# Patient Record
Sex: Female | Born: 1935
Health system: Southern US, Community
[De-identification: ages and names within clinical notes are randomized; demographics above are authoritative.]

## PROBLEM LIST (undated history)

## (undated) DIAGNOSIS — F329 Major depressive disorder, single episode, unspecified: Secondary | ICD-10-CM

## (undated) DIAGNOSIS — R7302 Impaired glucose tolerance (oral): Secondary | ICD-10-CM

## (undated) DIAGNOSIS — I10 Essential (primary) hypertension: Secondary | ICD-10-CM

## (undated) DIAGNOSIS — F32A Depression, unspecified: Secondary | ICD-10-CM

## (undated) DIAGNOSIS — E2839 Other primary ovarian failure: Secondary | ICD-10-CM

## (undated) DIAGNOSIS — E785 Hyperlipidemia, unspecified: Secondary | ICD-10-CM

## (undated) HISTORY — DX: Essential (primary) hypertension: I10

## (undated) HISTORY — DX: Impaired glucose tolerance (oral): R73.02

## (undated) HISTORY — DX: Other primary ovarian failure: E28.39

## (undated) HISTORY — DX: Hyperlipidemia, unspecified: E78.5

## (undated) HISTORY — DX: Depression, unspecified: F32.A

## (undated) HISTORY — PX: NO PAST SURGERIES: SHX2092

## (undated) HISTORY — DX: Major depressive disorder, single episode, unspecified: F32.9

---

## 2006-09-12 DIAGNOSIS — I10 Essential (primary) hypertension: Secondary | ICD-10-CM | POA: Insufficient documentation

## 2013-12-18 LAB — BASIC METABOLIC PANEL
BUN: 15 mg/dL (ref 4–21)
Creatinine: 0.9 mg/dL (ref 0.5–1.1)
Potassium: 4.4 mmol/L (ref 3.4–5.3)
Sodium: 140 mmol/L (ref 137–147)

## 2013-12-18 LAB — TSH: TSH: 3.47 u[IU]/mL (ref 0.41–5.90)

## 2013-12-18 LAB — HEPATIC FUNCTION PANEL
ALT: 9 U/L (ref 7–35)
AST: 13 U/L (ref 13–35)
Alkaline Phosphatase: 77 U/L (ref 25–125)
Bilirubin, Total: 0.5 mg/dL

## 2013-12-18 LAB — LIPID PANEL
Cholesterol: 173 mg/dL (ref 0–200)
HDL: 61 mg/dL (ref 35–70)
LDL CALC: 84 mg/dL
LDl/HDL Ratio: 1.4
Triglycerides: 139 mg/dL (ref 40–160)

## 2014-01-30 LAB — BASIC METABOLIC PANEL: Glucose: 113 mg/dL

## 2014-01-30 LAB — HEMOGLOBIN A1C: Hgb A1c MFr Bld: 5.2 % (ref 4.0–6.0)

## 2014-08-01 ENCOUNTER — Encounter: Payer: Self-pay | Admitting: Family Medicine

## 2014-08-01 ENCOUNTER — Ambulatory Visit (INDEPENDENT_AMBULATORY_CARE_PROVIDER_SITE_OTHER): Payer: Commercial Managed Care - HMO | Admitting: Family Medicine

## 2014-08-01 VITALS — BP 128/70 | HR 94 | Temp 97.2°F | Resp 16 | Ht 60.0 in | Wt 151.3 lb

## 2014-08-01 DIAGNOSIS — E785 Hyperlipidemia, unspecified: Secondary | ICD-10-CM

## 2014-08-01 DIAGNOSIS — F332 Major depressive disorder, recurrent severe without psychotic features: Secondary | ICD-10-CM | POA: Insufficient documentation

## 2014-08-01 DIAGNOSIS — I10 Essential (primary) hypertension: Secondary | ICD-10-CM | POA: Diagnosis not present

## 2014-08-01 NOTE — Progress Notes (Signed)
Name: Diane Rogers   MRN: [id number]    DOB: 1928/07/09   Date:08/01/2014       Progress Note  Subjective  Chief Complaint  Chief Complaint  Patient presents with  . Hypertension    Hypertension This is a chronic problem. The current episode started more than 1 year ago. The problem is unchanged. Associated symptoms include anxiety. Pertinent negatives include no blurred vision, chest pain, headaches, neck pain, orthopnea, palpitations or shortness of breath. There are no associated agents to hypertension. Risk factors for coronary artery disease include sedentary lifestyle. Past treatments include alpha 1 blockers, ACE inhibitors and calcium channel blockers. There are no compliance problems.   Hyperlipidemia This is a chronic problem. The current episode started more than 1 year ago. The problem is uncontrolled. Recent lipid tests were reviewed and are high. Pertinent negatives include no chest pain, focal weakness, myalgias or shortness of breath. She is currently on no antihyperlipidemic treatment. Compliance problems: Refuses to take statins.  Risk factors for coronary artery disease include a sedentary lifestyle and post-menopausal.      Past Medical History  Diagnosis Date  . Ovarian failure   . Hyperlipidemia   . Hypertension   . Glucose intolerance (impaired glucose tolerance)   . Depression     History  Substance Use Topics  . Smoking status: Never Smoker   . Smokeless tobacco: Not on file  . Alcohol Use: No     Current outpatient prescriptions:  .  amLODipine-benazepril (LOTREL) 10-20 MG per capsule, Take 1 capsule by mouth daily., Disp: , Rfl:  .  doxazosin (CARDURA) 4 MG tablet, Take 1 tablet by mouth 2 (two) times daily., Disp: , Rfl:   No Known Allergies  Review of Systems  Constitutional: Negative for fever, chills and weight loss.  HENT: Negative for congestion, hearing loss, sore throat and tinnitus.   Eyes: Negative for blurred vision, double  vision and redness.  Respiratory: Negative for cough, hemoptysis and shortness of breath.   Cardiovascular: Negative for chest pain, palpitations, orthopnea, claudication and leg swelling.  Gastrointestinal: Negative for heartburn, nausea, vomiting, diarrhea, constipation and blood in stool.  Genitourinary: Negative for dysuria, urgency, frequency and hematuria.  Musculoskeletal: Negative for myalgias, back pain, joint pain, falls and neck pain.  Skin: Negative for itching.  Neurological: Negative for dizziness, tingling, tremors, focal weakness, seizures, loss of consciousness, weakness and headaches.  Endo/Heme/Allergies: Does not bruise/bleed easily.  Psychiatric/Behavioral: Negative for depression and substance abuse. The patient is not nervous/anxious and does not have insomnia.      Objective  Filed Vitals:   08/01/14 1431  BP: 128/70  Pulse: 94  Temp: 97.2 F (36.2 C)  TempSrc: Oral  Resp: 16  Height: 5' (1.524 m)  Weight: 151 lb 4.8 oz (68.629 kg)  SpO2: 96%     Physical Exam  Constitutional: She is oriented to person, place, and time and well-developed, well-nourished, and in no distress.  HENT:  Head: Normocephalic.  Eyes: EOM are normal. Pupils are equal, round, and reactive to light.  Neck: Normal range of motion. No thyromegaly present.  Cardiovascular: Normal rate, regular rhythm and normal heart sounds.   No murmur heard. Pulmonary/Chest: Effort normal and breath sounds normal.  Abdominal: Soft. Bowel sounds are normal.  Musculoskeletal: Normal range of motion. She exhibits no edema.  Neurological: She is alert and oriented to person, place, and time. No cranial nerve deficit. Gait normal.  Skin: Skin is warm and dry. No rash noted.  Psychiatric: Mood, memory, affect and judgment normal.      Assessment & Plan  1. Benign essential HTN Well controlled - Comprehensive metabolic panel - TSH  2. HLD (hyperlipidemia) Lipid panel Pt refuses  statin

## 2014-08-01 NOTE — Patient Instructions (Signed)
F/U 6 MO 

## 2014-09-10 ENCOUNTER — Telehealth: Payer: Self-pay | Admitting: Family Medicine

## 2014-09-10 MED ORDER — AMLODIPINE BESY-BENAZEPRIL HCL 10-20 MG PO CAPS: 1.0000 | ORAL_CAPSULE | Freq: Every day | ORAL | Status: DC

## 2014-09-10 NOTE — Telephone Encounter (Signed)
Done

## 2014-09-10 NOTE — Telephone Encounter (Signed)
Pt called stating that her pharmacy contacted our office on last week requesting a refill on patient's prescription, Amlodipine-benazepril 10-20mg  1 tablet daily.  [facility] on [address]

## 2014-10-29 ENCOUNTER — Other Ambulatory Visit: Payer: Self-pay | Admitting: Emergency Medicine

## 2014-10-29 ENCOUNTER — Telehealth: Payer: Self-pay | Admitting: Family Medicine

## 2014-10-29 MED ORDER — DOXAZOSIN MESYLATE 4 MG PO TABS: 4.0000 mg | ORAL_TABLET | Freq: Two times a day (BID) | ORAL | Status: DC

## 2014-11-06 NOTE — Telephone Encounter (Signed)
ERRENOUS °

## 2014-11-14 ENCOUNTER — Telehealth: Payer: Self-pay | Admitting: Family Medicine

## 2014-11-14 MED ORDER — AMLODIPINE BESY-BENAZEPRIL HCL 10-20 MG PO CAPS: 1.0000 | ORAL_CAPSULE | Freq: Every day | ORAL | Status: DC

## 2014-11-14 NOTE — Telephone Encounter (Signed)
Tried calling pt to inform her and there was no answer.

## 2014-11-14 NOTE — Telephone Encounter (Signed)
Pt needs refill on Amlodbenazepriel to be sent to [facility] [address]

## 2014-11-14 NOTE — Telephone Encounter (Signed)
Refill sent to pharmacy.   

## 2015-01-23 ENCOUNTER — Telehealth: Payer: Self-pay | Admitting: Family Medicine

## 2015-01-23 DIAGNOSIS — I10 Essential (primary) hypertension: Secondary | ICD-10-CM | POA: Diagnosis not present

## 2015-01-23 NOTE — Telephone Encounter (Signed)
Need refill on amlod/benazepril 10-20mg . [facility] on [address]

## 2015-01-24 LAB — COMPREHENSIVE METABOLIC PANEL
A/G RATIO: 1.6 (ref 1.1–2.5)
ALT: 8 IU/L (ref 0–32)
AST: 14 IU/L (ref 0–40)
Albumin: 4.2 g/dL (ref 3.5–4.7)
Alkaline Phosphatase: 75 IU/L (ref 39–117)
BILIRUBIN TOTAL: 0.6 mg/dL (ref 0.0–1.2)
BUN/Creatinine Ratio: 17 (ref 11–26)
BUN: 15 mg/dL (ref 8–27)
CHLORIDE: 101 mmol/L (ref 96–106)
CO2: 26 mmol/L (ref 18–29)
Calcium: 9.2 mg/dL (ref 8.7–10.3)
Creatinine, Ser: 0.87 mg/dL (ref 0.57–1.00)
GFR calc Af Amer: 71 mL/min/{1.73_m2} (ref >59)
GFR calc non Af Amer: 62 mL/min/{1.73_m2} (ref >59)
GLUCOSE: 103 mg/dL — AB (ref 65–99)
Globulin, Total: 2.7 g/dL (ref 1.5–4.5)
POTASSIUM: 4.1 mmol/L (ref 3.5–5.2)
Sodium: 141 mmol/L (ref 134–144)
Total Protein: 6.9 g/dL (ref 6.0–8.5)

## 2015-01-24 LAB — TSH: TSH: 3.11 u[IU]/mL (ref 0.450–4.500)

## 2015-01-24 MED ORDER — AMLODIPINE BESY-BENAZEPRIL HCL 10-20 MG PO CAPS: 1.0000 | ORAL_CAPSULE | Freq: Every day | ORAL | Status: DC

## 2015-01-24 NOTE — Telephone Encounter (Signed)
Refill has been sent.  °

## 2015-02-04 ENCOUNTER — Ambulatory Visit: Payer: Commercial Managed Care - HMO | Admitting: Family Medicine

## 2015-03-07 ENCOUNTER — Ambulatory Visit: Payer: Commercial Managed Care - HMO | Admitting: Family Medicine

## 2015-04-14 ENCOUNTER — Ambulatory Visit: Payer: Commercial Managed Care - HMO | Admitting: Family Medicine

## 2015-04-17 ENCOUNTER — Other Ambulatory Visit: Payer: Self-pay | Admitting: Family Medicine

## 2015-04-25 ENCOUNTER — Other Ambulatory Visit: Payer: Self-pay | Admitting: Family Medicine

## 2015-04-25 MED ORDER — DOXAZOSIN MESYLATE 4 MG PO TABS: 4.0000 mg | ORAL_TABLET | Freq: Two times a day (BID) | ORAL | Status: DC

## 2015-05-06 ENCOUNTER — Other Ambulatory Visit: Payer: Self-pay | Admitting: Family Medicine

## 2015-05-20 ENCOUNTER — Ambulatory Visit: Payer: Commercial Managed Care - HMO | Admitting: Family Medicine

## 2015-06-12 ENCOUNTER — Other Ambulatory Visit: Payer: Self-pay | Admitting: Family Medicine

## 2015-07-21 ENCOUNTER — Ambulatory Visit (INDEPENDENT_AMBULATORY_CARE_PROVIDER_SITE_OTHER): Payer: Commercial Managed Care - HMO | Admitting: Family Medicine

## 2015-07-21 ENCOUNTER — Encounter: Payer: Self-pay | Admitting: Family Medicine

## 2015-07-21 VITALS — BP 128/64 | HR 101 | Temp 98.8°F | Resp 18 | Ht 60.0 in | Wt 151.1 lb

## 2015-07-21 DIAGNOSIS — R600 Localized edema: Secondary | ICD-10-CM | POA: Diagnosis not present

## 2015-07-21 DIAGNOSIS — I10 Essential (primary) hypertension: Secondary | ICD-10-CM

## 2015-07-21 MED ORDER — AMLODIPINE BESY-BENAZEPRIL HCL 10-20 MG PO CAPS: 1.0000 | ORAL_CAPSULE | Freq: Every day | ORAL | Status: DC

## 2015-07-21 MED ORDER — HYDROCHLOROTHIAZIDE 25 MG PO TABS: 25.0000 mg | ORAL_TABLET | Freq: Every day | ORAL | Status: DC

## 2015-07-21 NOTE — Progress Notes (Signed)
Date:  07/21/2015   Name:  Diane Rogers   DOB:  12-May-1928   MRN:  [id number]  PCP:  Dennison MascotLemont Morrisey, MD    Chief Complaint: Medication Refill and Hypertension   History of Present Illness:  This is a 82 y.o. female seen for six month f/u. Hx HTN on Lotrel and Cardura. Having some BLE edema, otherwise no new concerns. CMP/TSH in December ok.  Review of Systems:  Review of Systems  Constitutional: Negative for fever and fatigue.  Respiratory: Negative for cough and shortness of breath.   Cardiovascular: Negative for chest pain.  Neurological: Negative for syncope and light-headedness.    Patient Active Problem List   Diagnosis Date Noted  . Depression, endogenous (HCC) 08/01/2014  . HLD (hyperlipidemia) 08/01/2014  . Benign essential HTN 09/12/2006    Prior to Admission medications   Medication Sig Start Date End Date Taking? Authorizing Provider  amLODipine-benazepril (LOTREL) 10-20 MG capsule Take 1 capsule by mouth daily. 07/21/15   Schuyler AmorWilliam Ina Scrivens, MD  hydrochlorothiazide (HYDRODIURIL) 25 MG tablet Take 1 tablet (25 mg total) by mouth daily. 07/21/15   Schuyler AmorWilliam Kindle Strohmeier, MD    No Known Allergies  Past Surgical History  Procedure Laterality Date  . No past surgeries      Social History  Substance Use Topics  . Smoking status: Never Smoker   . Smokeless tobacco: Not on file  . Alcohol Use: No    No family history on file.  Medication list has been reviewed and updated.  Physical Examination: BP 128/64 mmHg  Pulse 101  Temp(Src) 98.8 F (37.1 C)  Resp 18  Ht 5' (1.524 m)  Wt 151 lb 1 oz (68.522 kg)  BMI 29.50 kg/m2  SpO2 96%  Physical Exam  Constitutional: She appears well-developed and well-nourished.  Cardiovascular: Normal rate, regular rhythm and normal heart sounds.   Pulmonary/Chest: Effort normal and breath sounds normal.  Musculoskeletal:  1+ BLE edema  Neurological: She is alert.  Skin: Skin is warm and dry.  Psychiatric: She has a normal  mood and affect. Her behavior is normal.  Nursing note and vitals reviewed.   Assessment and Plan:  1. Benign essential HTN Well controlled but Cardura has poor outcome data, change to HCTZ 25 mg daily, refill Lotrel, consider BMP/vit D level next visit  2. Edema of lower extremity, unspecified laterality May be due to Cardura, should improve with HCTZ   Return in about 4 weeks (around 08/18/2015).  Dionne AnoWilliam M. Kingsley SpittlePlonk, Jr. MD Board Certified in Geriatric Medicine [facility]  07/21/2015

## 2015-08-26 ENCOUNTER — Ambulatory Visit: Payer: Commercial Managed Care - HMO | Admitting: Family Medicine

## 2015-11-04 ENCOUNTER — Other Ambulatory Visit: Payer: Self-pay | Admitting: Family Medicine

## 2016-02-18 ENCOUNTER — Encounter: Payer: Self-pay | Admitting: Family Medicine

## 2016-02-18 ENCOUNTER — Ambulatory Visit (INDEPENDENT_AMBULATORY_CARE_PROVIDER_SITE_OTHER): Payer: Commercial Managed Care - HMO | Admitting: Family Medicine

## 2016-02-18 VITALS — BP 118/68 | HR 88 | Temp 97.9°F | Resp 16 | Ht 60.0 in | Wt 146.8 lb

## 2016-02-18 DIAGNOSIS — I10 Essential (primary) hypertension: Secondary | ICD-10-CM

## 2016-02-18 MED ORDER — HYDROCHLOROTHIAZIDE 25 MG PO TABS: 25.0000 mg | ORAL_TABLET | Freq: Every day | ORAL | 0 refills | Status: DC

## 2016-02-18 NOTE — Progress Notes (Signed)
Name: Diane Rogers   MRN: [id number]    DOB: 02/05/1929   Date:02/18/2016       Progress Note  Subjective  Chief Complaint  Chief Complaint  Patient presents with  . Hypertension    6 month recheck, medication refills. Has not taken HCTZ since November    Hypertension  This is a chronic problem. The problem is unchanged. The problem is controlled. Pertinent negatives include no blurred vision, chest pain, headaches, orthopnea, palpitations or shortness of breath. Past treatments include ACE inhibitors, calcium channel blockers and diuretics. There is no history of kidney disease, CAD/MI or CVA.     Past Medical History:  Diagnosis Date  . Depression   . Glucose intolerance (impaired glucose tolerance)   . Hyperlipidemia   . Hypertension   . Ovarian failure     Past Surgical History:  Procedure Laterality Date  . NO PAST SURGERIES      No family history on file.  Social History   Social History  . Marital status: Widowed    Spouse name: N/A  . Number of children: N/A  . Years of education: N/A   Occupational History  . Not on file.   Social History Main Topics  . Smoking status: Never Smoker  . Smokeless tobacco: Not on file  . Alcohol use No  . Drug use: No  . Sexual activity: Not on file   Other Topics Concern  . Not on file   Social History Narrative  . No narrative on file     Current Outpatient Prescriptions:  .  amLODipine-benazepril (LOTREL) 10-20 MG capsule, Take 1 capsule by mouth daily., Disp: 90 capsule, Rfl: 3 .  hydrochlorothiazide (HYDRODIURIL) 25 MG tablet, Take 1 tablet (25 mg total) by mouth daily. (Patient not taking: Reported on 02/18/2016), Disp: 90 tablet, Rfl: 3  No Known Allergies   Review of Systems  Eyes: Negative for blurred vision.  Respiratory: Negative for shortness of breath.   Cardiovascular: Negative for chest pain, palpitations and orthopnea.  Neurological: Negative for headaches.     Objective  Vitals:    02/18/16 1419  BP: 118/68  Pulse: 88  Resp: 16  Temp: 97.9 F (36.6 C)  TempSrc: Oral  SpO2: 94%  Weight: 146 lb 12.8 oz (66.6 kg)  Height: 5' (1.524 m)    Physical Exam  Constitutional: She is oriented to person, place, and time and well-developed, well-nourished, and in no distress.  Cardiovascular: Normal rate, regular rhythm and normal heart sounds.   No murmur heard. Pulmonary/Chest: Effort normal and breath sounds normal. She has no wheezes.  Neurological: She is alert and oriented to person, place, and time.  Psychiatric: Mood, memory, affect and judgment normal.  Nursing note and vitals reviewed.      Assessment & Plan  1. Benign essential HTN Stable, refills and hydrochlorothiazide provided - hydrochlorothiazide (HYDRODIURIL) 25 MG tablet; Take 1 tablet (25 mg total) by mouth daily.  Dispense: 90 tablet; Refill: 0   Herberto Ledwell Asad A. Faylene KurtzShah [facility] 02/18/2016 2:33 PM

## 2016-08-18 ENCOUNTER — Ambulatory Visit: Payer: Commercial Managed Care - HMO | Admitting: Family Medicine

## 2016-08-23 ENCOUNTER — Encounter: Payer: Self-pay | Admitting: Family Medicine

## 2016-08-23 ENCOUNTER — Ambulatory Visit (INDEPENDENT_AMBULATORY_CARE_PROVIDER_SITE_OTHER): Payer: Medicare HMO | Admitting: Family Medicine

## 2016-08-23 DIAGNOSIS — I10 Essential (primary) hypertension: Secondary | ICD-10-CM

## 2016-08-23 MED ORDER — HYDROCHLOROTHIAZIDE 25 MG PO TABS: 25.0000 mg | ORAL_TABLET | Freq: Every day | ORAL | 0 refills | Status: DC

## 2016-08-23 MED ORDER — AMLODIPINE BESY-BENAZEPRIL HCL 10-20 MG PO CAPS: 1.0000 | ORAL_CAPSULE | Freq: Every day | ORAL | 0 refills | Status: DC

## 2016-08-23 NOTE — Progress Notes (Signed)
Name: Diane Rogers   MRN: [id number]    DOB: 30-May-1928   Date:08/23/2016       Progress Note  Subjective  Chief Complaint  Chief Complaint  Patient presents with  . Follow-up    6 mo  . Medication Refill    Hypertension  This is a chronic problem. The problem is unchanged. The problem is controlled. Pertinent negatives include no blurred vision, chest pain, headaches, orthopnea, palpitations or shortness of breath. Past treatments include ACE inhibitors, calcium channel blockers and diuretics. There is no history of kidney disease, CAD/MI or CVA.     Past Medical History:  Diagnosis Date  . Depression   . Glucose intolerance (impaired glucose tolerance)   . Hyperlipidemia   . Hypertension   . Ovarian failure     Past Surgical History:  Procedure Laterality Date  . NO PAST SURGERIES      History reviewed. No pertinent family history.  Social History   Social History  . Marital status: Widowed    Spouse name: N/A  . Number of children: N/A  . Years of education: N/A   Occupational History  . Not on file.   Social History Main Topics  . Smoking status: Never Smoker  . Smokeless tobacco: Never Used  . Alcohol use No  . Drug use: No  . Sexual activity: Not on file   Other Topics Concern  . Not on file   Social History Narrative  . No narrative on file     Current Outpatient Prescriptions:  .  amLODipine-benazepril (LOTREL) 10-20 MG capsule, Take 1 capsule by mouth daily., Disp: 90 capsule, Rfl: 3 .  hydrochlorothiazide (HYDRODIURIL) 25 MG tablet, Take 1 tablet (25 mg total) by mouth daily., Disp: 90 tablet, Rfl: 0  No Known Allergies   Review of Systems  Eyes: Negative for blurred vision.  Respiratory: Negative for shortness of breath.   Cardiovascular: Negative for chest pain, palpitations and orthopnea.  Neurological: Negative for headaches.      Objective  Vitals:   08/23/16 1135  BP: 118/79  Pulse: (!) 113  Resp: 17  Temp: 98 F  (36.7 C)  TempSrc: Oral  SpO2: 94%  Weight: 150 lb 14.4 oz (68.4 kg)  Height: 5' (1.524 m)    Physical Exam  Constitutional: She is oriented to person, place, and time and well-developed, well-nourished, and in no distress.  Cardiovascular: Normal rate, regular rhythm and normal heart sounds.   No murmur heard. Pulmonary/Chest: Effort normal and breath sounds normal. She has no wheezes.  Neurological: She is alert and oriented to person, place, and time.  Psychiatric: Mood, memory, affect and judgment normal.  Nursing note and vitals reviewed.      Assessment & Plan  1. Benign essential HTN BP stable on present anti-hypertensive therapy, refills provided - hydrochlorothiazide (HYDRODIURIL) 25 MG tablet; Take 1 tablet (25 mg total) by mouth daily.  Dispense: 90 tablet; Refill: 0 - amLODipine-benazepril (LOTREL) 10-20 MG capsule; Take 1 capsule by mouth daily.  Dispense: 90 capsule; Refill: 0   Ayman Brull Asad A. Faylene KurtzShah [facility] 08/23/2016 11:41 AM

## 2016-11-23 ENCOUNTER — Ambulatory Visit (INDEPENDENT_AMBULATORY_CARE_PROVIDER_SITE_OTHER): Payer: Medicare HMO | Admitting: Family Medicine

## 2016-11-23 ENCOUNTER — Ambulatory Visit (INDEPENDENT_AMBULATORY_CARE_PROVIDER_SITE_OTHER): Payer: Medicare HMO

## 2016-11-23 VITALS — BP 132/62 | HR 84 | Temp 98.3°F | Resp 16 | Ht 60.0 in | Wt 150.4 lb

## 2016-11-23 VITALS — BP 132/62 | HR 84 | Temp 98.3°F | Resp 14 | Ht 60.0 in | Wt 150.4 lb

## 2016-11-23 DIAGNOSIS — E559 Vitamin D deficiency, unspecified: Secondary | ICD-10-CM | POA: Diagnosis not present

## 2016-11-23 DIAGNOSIS — Z0000 Encounter for general adult medical examination without abnormal findings: Secondary | ICD-10-CM

## 2016-11-23 DIAGNOSIS — I10 Essential (primary) hypertension: Secondary | ICD-10-CM

## 2016-11-23 DIAGNOSIS — E785 Hyperlipidemia, unspecified: Secondary | ICD-10-CM | POA: Diagnosis not present

## 2016-11-23 MED ORDER — HYDROCHLOROTHIAZIDE 25 MG PO TABS: 25.0000 mg | ORAL_TABLET | Freq: Every day | ORAL | 0 refills | Status: DC

## 2016-11-23 MED ORDER — AMLODIPINE BESY-BENAZEPRIL HCL 10-20 MG PO CAPS: 1.0000 | ORAL_CAPSULE | Freq: Every day | ORAL | 0 refills | Status: DC

## 2016-11-23 NOTE — Patient Instructions (Signed)
Diane Rogers , Thank you for taking time to come for your Medicare Wellness Visit. I appreciate your ongoing commitment to your health goals. Please review the following plan we discussed and let me know if I can assist you in the future.   Screening recommendations/referrals: Colonoscopy: Declined Mammogram: Declined Bone Density: Completed 01/27/09 Recommended yearly ophthalmology/optometry visit for glaucoma screening and checkup Recommended yearly dental visit for hygiene and checkup  Vaccinations: Influenza vaccine: Declined Pneumococcal vaccine: Declined Tdap vaccine: Declined Shingles vaccine: Declined    Advanced directives: Advance directive discussed with you today. Even though you declined this today please call our office should you change your mind and we can give you the proper paperwork for you to fill out.  Conditions/risks identified: Recommend to decrease eating out and develop a healthier diet that includes proteins, fruits and vegetables  Next appointment: Scheduled to see Dr. Sherryll Burger on 11/23/16 @ 10:40am. Follow up in one year for your annual wellness exam.   Preventive Care 81 Years and Older, Female Preventive care refers to lifestyle choices and visits with your health care provider that can promote health and wellness. What does preventive care include?  A yearly physical exam. This is also called an annual well check.  Dental exams once or twice a year.  Routine eye exams. Ask your health care provider how often you should have your eyes checked.  Personal lifestyle choices, including:  Daily care of your teeth and gums.  Regular physical activity.  Eating a healthy diet.  Avoiding tobacco and drug use.  Limiting alcohol use.  Practicing safe sex.  Taking low-dose aspirin every day.  Taking vitamin and mineral supplements as recommended by your health care provider. What happens during an annual well check? The services and screenings done by  your health care provider during your annual well check will depend on your age, overall health, lifestyle risk factors, and family history of disease. Counseling  Your health care provider may ask you questions about your:  Alcohol use.  Tobacco use.  Drug use.  Emotional well-being.  Home and relationship well-being.  Sexual activity.  Eating habits.  History of falls.  Memory and ability to understand (cognition).  Work and work Astronomer.  Reproductive health. Screening  You may have the following tests or measurements:  Height, weight, and BMI.  Blood pressure.  Lipid and cholesterol levels. These may be checked every 5 years, or more frequently if you are over 72 years old.  Skin check.  Lung cancer screening. You may have this screening every year starting at age 22 if you have a 30-pack-year history of smoking and currently smoke or have quit within the past 15 years.  Fecal occult blood test (FOBT) of the stool. You may have this test every year starting at age 19.  Flexible sigmoidoscopy or colonoscopy. You may have a sigmoidoscopy every 5 years or a colonoscopy every 10 years starting at age 62.  Hepatitis C blood test.  Hepatitis B blood test.  Sexually transmitted disease (STD) testing.  Diabetes screening. This is done by checking your blood sugar (glucose) after you have not eaten for a while (fasting). You may have this done every 1-3 years.  Bone density scan. This is done to screen for osteoporosis. You may have this done starting at age 4.  Mammogram. This may be done every 1-2 years. Talk to your health care provider about how often you should have regular mammograms. Talk with your health care provider about your  test results, treatment options, and if necessary, the need for more tests. Vaccines  Your health care provider may recommend certain vaccines, such as:  Influenza vaccine. This is recommended every year.  Tetanus,  diphtheria, and acellular pertussis (Tdap, Td) vaccine. You may need a Td booster every 10 years.  Zoster vaccine. You may need this after age 63.  Pneumococcal 13-valent conjugate (PCV13) vaccine. One dose is recommended after age 66.  Pneumococcal polysaccharide (PPSV23) vaccine. One dose is recommended after age 18. Talk to your health care provider about which screenings and vaccines you need and how often you need them. This information is not intended to replace advice given to you by your health care provider. Make sure you discuss any questions you have with your health care provider. Document Released: 02/21/2015 Document Revised: 10/15/2015 Document Reviewed: 11/26/2014 Elsevier Interactive Patient Education  2017 [place] Prevention in the Home Falls can cause injuries. They can happen to people of all ages. There are many things you can do to make your home safe and to help prevent falls. What can I do on the outside of my home?  Regularly fix the edges of walkways and driveways and fix any cracks.  Remove anything that might make you trip as you walk through a door, such as a raised step or threshold.  Trim any bushes or trees on the path to your home.  Use bright outdoor lighting.  Clear any walking paths of anything that might make someone trip, such as rocks or tools.  Regularly check to see if handrails are loose or broken. Make sure that both sides of any steps have handrails.  Any raised decks and porches should have guardrails on the edges.  Have any leaves, snow, or ice cleared regularly.  Use sand or salt on walking paths during winter.  Clean up any spills in your garage right away. This includes oil or grease spills. What can I do in the bathroom?  Use night lights.  Install grab bars by the toilet and in the tub and shower. Do not use towel bars as grab bars.  Use non-skid mats or decals in the tub or shower.  If you need to sit down in  the shower, use a plastic, non-slip stool.  Keep the floor dry. Clean up any water that spills on the floor as soon as it happens.  Remove soap buildup in the tub or shower regularly.  Attach bath mats securely with double-sided non-slip rug tape.  Do not have throw rugs and other things on the floor that can make you trip. What can I do in the bedroom?  Use night lights.  Make sure that you have a light by your bed that is easy to reach.  Do not use any sheets or blankets that are too big for your bed. They should not hang down onto the floor.  Have a firm chair that has side arms. You can use this for support while you get dressed.  Do not have throw rugs and other things on the floor that can make you trip. What can I do in the kitchen?  Clean up any spills right away.  Avoid walking on wet floors.  Keep items that you use a lot in easy-to-reach places.  If you need to reach something above you, use a strong step stool that has a grab bar.  Keep electrical cords out of the way.  Do not use floor polish or wax that  makes floors slippery. If you must use wax, use non-skid floor wax.  Do not have throw rugs and other things on the floor that can make you trip. What can I do with my stairs?  Do not leave any items on the stairs.  Make sure that there are handrails on both sides of the stairs and use them. Fix handrails that are broken or loose. Make sure that handrails are as long as the stairways.  Check any carpeting to make sure that it is firmly attached to the stairs. Fix any carpet that is loose or worn.  Avoid having throw rugs at the top or bottom of the stairs. If you do have throw rugs, attach them to the floor with carpet tape.  Make sure that you have a light switch at the top of the stairs and the bottom of the stairs. If you do not have them, ask someone to add them for you. What else can I do to help prevent falls?  Wear shoes that:  Do not have high  heels.  Have rubber bottoms.  Are comfortable and fit you well.  Are closed at the toe. Do not wear sandals.  If you use a stepladder:  Make sure that it is fully opened. Do not climb a closed stepladder.  Make sure that both sides of the stepladder are locked into place.  Ask someone to hold it for you, if possible.  Clearly mark and make sure that you can see:  Any grab bars or handrails.  First and last steps.  Where the edge of each step is.  Use tools that help you move around (mobility aids) if they are needed. These include:  Canes.  Walkers.  Scooters.  Crutches.  Turn on the lights when you go into a dark area. Replace any light bulbs as soon as they burn out.  Set up your furniture so you have a clear path. Avoid moving your furniture around.  If any of your floors are uneven, fix them.  If there are any pets around you, be aware of where they are.  Review your medicines with your doctor. Some medicines can make you feel dizzy. This can increase your chance of falling. Ask your doctor what other things that you can do to help prevent falls. This information is not intended to replace advice given to you by your health care provider. Make sure you discuss any questions you have with your health care provider. Document Released: 11/21/2008 Document Revised: 07/03/2015 Document Reviewed: 03/01/2014 Elsevier Interactive Patient Education  2017 [employer].

## 2016-11-23 NOTE — Progress Notes (Signed)
Subjective:   Diane Rogers is a 83 y.o. female who presents for Medicare Annual (Subsequent) preventive examination.  Review of Systems:  N/A Cardiac Risk Factors include: advanced age (>47men, >4 women);dyslipidemia;sedentary lifestyle;hypertension     Objective:     Vitals: BP 132/62 (BP Location: Right Arm, Patient Position: Sitting, Cuff Size: Large)   Pulse 84   Temp 98.3 F (36.8 C) (Oral)   Resp 16   Ht 5' (1.524 m)   Wt 150 lb 6.4 oz (68.2 kg)   BMI 29.37 kg/m   Body mass index is 29.37 kg/m.   Tobacco History  Smoking Status  . Never Smoker  Smokeless Tobacco  . Never Used     Counseling given: Not Answered   Past Medical History:  Diagnosis Date  . Depression   . Glucose intolerance (impaired glucose tolerance)   . Hyperlipidemia   . Hypertension   . Ovarian failure    Past Surgical History:  Procedure Laterality Date  . NO PAST SURGERIES     History reviewed. No pertinent family history. History  Sexual Activity  . Sexual activity: Not on file    Outpatient Encounter Prescriptions as of 11/23/2016  Medication Sig  . amLODipine-benazepril (LOTREL) 10-20 MG capsule Take 1 capsule by mouth daily.  . hydrochlorothiazide (HYDRODIURIL) 25 MG tablet Take 1 tablet (25 mg total) by mouth daily.   No facility-administered encounter medications on file as of 11/23/2016.     Activities of Daily Living In your present state of health, do you have any difficulty performing the following activities: 11/23/2016 08/23/2016  Hearing? N N  Vision? N N  Difficulty concentrating or making decisions? N N  Walking or climbing stairs? N N  Dressing or bathing? N N  Doing errands, shopping? N N  Preparing Food and eating ? N -  Using the Toilet? N -  In the past six months, have you accidently leaked urine? N -  Do you have problems with loss of bowel control? N -  Managing your Medications? N -  Managing your Finances? N -  Housekeeping or  managing your Housekeeping? N -  Some recent data might be hidden    Patient Care Team: Ellyn Hack, MD as PCP - General (Family Medicine)    Assessment:     Exercise Activities and Dietary recommendations Current Exercise Habits: The patient does not participate in regular exercise at present, Exercise limited by: None identified  Goals    . Reduce sodium intake          Recommend to decrease eating out and develop a healthier diet that includes proteins, fruits and vegetables      Fall Risk Fall Risk  11/23/2016 08/23/2016 07/21/2015 08/01/2014 08/01/2014  Falls in the past year? No No No No No  Risk for fall due to : Impaired balance/gait - - - -  Risk for fall due to: Comment occassionally stumbles - - - -   Depression Screen PHQ 2/9 Scores 11/23/2016 08/23/2016 07/21/2015  PHQ - 2 Score 0 0 0     Cognitive Function: declined         There is no immunization history on file for this patient. Screening Tests Health Maintenance  Topic Date Due  . DEXA SCAN  02/02/1996  . INFLUENZA VACCINE  11/08/2017 (Originally 09/08/2016)  . TETANUS/TDAP  11/08/2017 (Originally 02/01/1948)  . PNA vac Low Risk Adult (1 of 2 - PCV13) 11/08/2017 (Originally 02/02/1996)  Plan:    I have personally reviewed and addressed the Medicare Annual Wellness questionnaire and have noted the following in the patient's chart:  A. Medical and social history B. Use of alcohol, tobacco or illicit drugs  C. Current medications and supplements D. Functional ability and status E.  Nutritional status F.  Physical activity G. Advance directives H. List of other physicians I.  Hospitalizations, surgeries, and ER visits in previous 12 months J.  Vitals K. Screenings such as hearing and vision if needed, cognitive and depression L. Referrals and appointments - none  In addition, I have reviewed and discussed with patient certain preventive protocols, quality metrics, and best practice  recommendations. A written personalized care plan for preventive services as well as general preventive health recommendations were provided to patient.  See attached scanned questionnaire for additional information.   Signed,  Deon Pilling, LPN Nurse Health Advisor   MD Recommendations: Declined flu, pneumonia, shingles and tetanus vaccines. Also declined bone density scan.

## 2016-11-23 NOTE — Progress Notes (Signed)
Name: Diane Rogers   MRN: [id number]    DOB: [birth year]   Date:11/23/2016       Progress Note  Subjective  Chief Complaint  Chief Complaint  Patient presents with  . Follow-up    2nd part of medicare wellness     HPI  Pt. Presents for part 2 of Medicare Wellness exam. She declines mammogram, colon cancer screening or bone density testing.   Past Medical History:  Diagnosis Date  . Depression   . Glucose intolerance (impaired glucose tolerance)   . Hyperlipidemia   . Hypertension   . Ovarian failure     Past Surgical History:  Procedure Laterality Date  . NO PAST SURGERIES      No family history on file.  Social History   Social History  . Marital status: Widowed    Spouse name: N/A  . Number of children: N/A  . Years of education: N/A   Occupational History  . Not on file.   Social History Main Topics  . Smoking status: Never Smoker  . Smokeless tobacco: Never Used  . Alcohol use No  . Drug use: No  . Sexual activity: Not on file   Other Topics Concern  . Not on file   Social History Narrative  . No narrative on file     Current Outpatient Prescriptions:  .  amLODipine-benazepril (LOTREL) 10-20 MG capsule, Take 1 capsule by mouth daily., Disp: 90 capsule, Rfl: 0 .  hydrochlorothiazide (HYDRODIURIL) 25 MG tablet, Take 1 tablet (25 mg total) by mouth daily., Disp: 90 tablet, Rfl: 0  No Known Allergies   Review of Systems  Constitutional: Negative for chills, fever and malaise/fatigue.  HENT: Negative for congestion, ear pain and sore throat.   Eyes: Negative for blurred vision and double vision.  Respiratory: Negative for cough, sputum production and shortness of breath.   Cardiovascular: Negative for chest pain, palpitations and leg swelling.  Gastrointestinal: Negative for abdominal pain, blood in stool, constipation, diarrhea, nausea and vomiting.  Genitourinary: Negative for hematuria.  Musculoskeletal: Negative for back pain and neck  pain.  Skin: Negative for itching and rash.  Neurological: Negative for dizziness and headaches.  Psychiatric/Behavioral: Negative for depression. The patient is not nervous/anxious and does not have insomnia.       Objective  Vitals:   11/23/16 1042  BP: 132/62  Pulse: 84  Resp: 14  Temp: 98.3 F (36.8 C)  TempSrc: Oral  SpO2: 98%  Weight: 150 lb 6.4 oz (68.2 kg)  Height: 5' (1.524 m)    Physical Exam  Constitutional: She is oriented to person, place, and time and well-developed, well-nourished, and in no distress.  HENT:  Head: Normocephalic and atraumatic.  Right Ear: External ear normal.  Left Ear: External ear normal.  Mouth/Throat: Oropharynx is clear and moist.  Eyes: Pupils are equal, round, and reactive to light.  Neck: Neck supple.  Cardiovascular: Normal rate, regular rhythm and normal heart sounds.   No murmur heard. Pulmonary/Chest: Effort normal and breath sounds normal. She has no wheezes.  Abdominal: Soft. Bowel sounds are normal. There is no tenderness.  Musculoskeletal: She exhibits edema.  Neurological: She is alert and oriented to person, place, and time.  Psychiatric: Mood, memory, affect and judgment normal.  Nursing note and vitals reviewed.      Assessment & Plan  1. Annual physical exam Obtain age-appropriate laboratory screening - CBC with Differential/Platelet - COMPLETE METABOLIC PANEL WITH GFR - Lipid panel - TSH - VITAMIN  D 25 Hydroxy (Vit-D Deficiency, Fractures)  2. Benign essential HTN BP stable on present anti- hypertensive treatment - amLODipine-benazepril (LOTREL) 10-20 MG capsule; Take 1 capsule by mouth daily.  Dispense: 90 capsule; Refill: 0 - hydrochlorothiazide (HYDRODIURIL) 25 MG tablet; Take 1 tablet (25 mg total) by mouth daily.  Dispense: 90 tablet; Refill: 0   Jennifer Payes Asad A. [facility] 11/23/2016 10:56 AM

## 2016-11-24 LAB — CBC WITH DIFFERENTIAL/PLATELET
Basophils Absolute: 69 cells/uL (ref 0–200)
Basophils Relative: 0.8 %
EOS PCT: 1.3 %
Eosinophils Absolute: 112 cells/uL (ref 15–500)
HEMATOCRIT: 37.4 % (ref 35.0–45.0)
Hemoglobin: 12.5 g/dL (ref 11.7–15.5)
LYMPHS ABS: 1342 {cells}/uL (ref 850–3900)
MCH: 29.6 pg (ref 27.0–33.0)
MCHC: 33.4 g/dL (ref 32.0–36.0)
MCV: 88.6 fL (ref 80.0–100.0)
MPV: 11.7 fL (ref 7.5–12.5)
Monocytes Relative: 7.9 %
NEUTROS ABS: 6398 {cells}/uL (ref 1500–7800)
NEUTROS PCT: 74.4 %
Platelets: 268 10*3/uL (ref 140–400)
RBC: 4.22 10*6/uL (ref 3.80–5.10)
RDW: 13.4 % (ref 11.0–15.0)
Total Lymphocyte: 15.6 %
WBC mixed population: 679 cells/uL (ref 200–950)
WBC: 8.6 10*3/uL (ref 3.8–10.8)

## 2016-11-24 LAB — COMPLETE METABOLIC PANEL WITH GFR
AG Ratio: 1.3 (calc) (ref 1.0–2.5)
ALT: 8 U/L (ref 6–29)
AST: 12 U/L (ref 10–35)
Albumin: 3.8 g/dL (ref 3.6–5.1)
Alkaline phosphatase (APISO): 63 U/L (ref 33–130)
BUN/Creatinine Ratio: 12 (calc) (ref 6–22)
BUN: 12 mg/dL (ref 7–25)
CALCIUM: 9 mg/dL (ref 8.6–10.4)
CO2: 28 mmol/L (ref 20–32)
CREATININE: 1.03 mg/dL — AB (ref 0.60–0.88)
Chloride: 104 mmol/L (ref 98–110)
GFR, EST NON AFRICAN AMERICAN: 50 mL/min/{1.73_m2} — AB (ref >=60)
GFR, Est African American: 57 mL/min/{1.73_m2} — ABNORMAL LOW (ref >=60)
GLOBULIN: 2.9 g/dL (ref 1.9–3.7)
Glucose, Bld: 92 mg/dL (ref 65–99)
Potassium: 3.8 mmol/L (ref 3.5–5.3)
SODIUM: 139 mmol/L (ref 135–146)
Total Bilirubin: 0.7 mg/dL (ref 0.2–1.2)
Total Protein: 6.7 g/dL (ref 6.1–8.1)

## 2016-11-24 LAB — VITAMIN D 25 HYDROXY (VIT D DEFICIENCY, FRACTURES): VIT D 25 HYDROXY: 6 ng/mL — AB (ref 30–100)

## 2016-11-24 LAB — LIPID PANEL
Cholesterol: 184 mg/dL (ref <200)
HDL: 64 mg/dL (ref >50)
LDL CHOLESTEROL (CALC): 98 mg/dL
NON-HDL CHOLESTEROL (CALC): 120 mg/dL (ref <130)
TRIGLYCERIDES: 128 mg/dL (ref <150)
Total CHOL/HDL Ratio: 2.9 (calc) (ref <5.0)

## 2016-11-24 LAB — TSH: TSH: 2.72 mIU/L (ref 0.40–4.50)

## 2016-11-30 ENCOUNTER — Other Ambulatory Visit: Payer: Self-pay

## 2016-11-30 DIAGNOSIS — E559 Vitamin D deficiency, unspecified: Secondary | ICD-10-CM

## 2016-11-30 MED ORDER — VITAMIN D (ERGOCALCIFEROL) 1.25 MG (50000 UNIT) PO CAPS: ORAL_CAPSULE | ORAL | 0 refills | Status: DC

## 2016-12-01 NOTE — Progress Notes (Signed)
Attempted to call pt. Unable to leave message. 

## 2016-12-06 NOTE — Progress Notes (Signed)
Patient has not called back in to the [facility] 

## 2017-01-07 ENCOUNTER — Telehealth: Payer: Self-pay

## 2017-01-07 NOTE — Telephone Encounter (Signed)
Copied from CRM [id number]. Topic: General - Other >> Jan 05, 2017 10:05 AM Clack, Princella PellegriniJessica D wrote: Reason for CRM: Pt daughter Darl PikesSusan states she has questions about FMLA paperwork for her. She would like someone to give her a call at [phone]  >> Jan 06, 2017  2:19 PM Gerrianne ScalePayne, Angela L wrote: Daughter susan calling back about how to get FMLA paperwork for her since she brings her Mother to see Dr Sherryll BurgerShah daughter Darl PikesSusan number [phone] she left a message yesterday

## 2017-01-07 NOTE — Telephone Encounter (Signed)
Left a message to give our office a call.

## 2017-11-24 ENCOUNTER — Ambulatory Visit: Payer: Medicare HMO

## 2018-03-02 ENCOUNTER — Other Ambulatory Visit: Payer: Self-pay

## 2018-03-02 ENCOUNTER — Inpatient Hospital Stay
Admission: EM | Admit: 2018-03-02 | Discharge: 2018-03-07 | DRG: 470 | Disposition: A | Discharge status: Skilled Nursing Facility | Payer: Medicare HMO | Attending: Internal Medicine | Admitting: Internal Medicine

## 2018-03-02 ENCOUNTER — Emergency Department: Payer: Medicare HMO

## 2018-03-02 DIAGNOSIS — E860 Dehydration: Secondary | ICD-10-CM | POA: Diagnosis not present

## 2018-03-02 DIAGNOSIS — S72002A Fracture of unspecified part of neck of left femur, initial encounter for closed fracture: Secondary | ICD-10-CM | POA: Diagnosis not present

## 2018-03-02 DIAGNOSIS — Y92017 Garden or yard in single-family (private) house as the place of occurrence of the external cause: Secondary | ICD-10-CM | POA: Diagnosis not present

## 2018-03-02 DIAGNOSIS — M6281 Muscle weakness (generalized): Secondary | ICD-10-CM | POA: Diagnosis not present

## 2018-03-02 DIAGNOSIS — W010XXA Fall on same level from slipping, tripping and stumbling without subsequent striking against object, initial encounter: Secondary | ICD-10-CM | POA: Diagnosis present

## 2018-03-02 DIAGNOSIS — S72009A Fracture of unspecified part of neck of unspecified femur, initial encounter for closed fracture: Secondary | ICD-10-CM | POA: Diagnosis not present

## 2018-03-02 DIAGNOSIS — N179 Acute kidney failure, unspecified: Secondary | ICD-10-CM | POA: Diagnosis not present

## 2018-03-02 DIAGNOSIS — Z9181 History of falling: Secondary | ICD-10-CM | POA: Diagnosis not present

## 2018-03-02 DIAGNOSIS — D62 Acute posthemorrhagic anemia: Secondary | ICD-10-CM | POA: Diagnosis not present

## 2018-03-02 DIAGNOSIS — Z7401 Bed confinement status: Secondary | ICD-10-CM | POA: Diagnosis not present

## 2018-03-02 DIAGNOSIS — E785 Hyperlipidemia, unspecified: Secondary | ICD-10-CM | POA: Diagnosis not present

## 2018-03-02 DIAGNOSIS — S72001A Fracture of unspecified part of neck of right femur, initial encounter for closed fracture: Secondary | ICD-10-CM | POA: Diagnosis present

## 2018-03-02 DIAGNOSIS — Z8249 Family history of ischemic heart disease and other diseases of the circulatory system: Secondary | ICD-10-CM | POA: Diagnosis not present

## 2018-03-02 DIAGNOSIS — I160 Hypertensive urgency: Secondary | ICD-10-CM | POA: Diagnosis present

## 2018-03-02 DIAGNOSIS — M25572 Pain in left ankle and joints of left foot: Secondary | ICD-10-CM | POA: Diagnosis not present

## 2018-03-02 DIAGNOSIS — Z96649 Presence of unspecified artificial hip joint: Secondary | ICD-10-CM

## 2018-03-02 DIAGNOSIS — S72012A Unspecified intracapsular fracture of left femur, initial encounter for closed fracture: Principal | ICD-10-CM

## 2018-03-02 DIAGNOSIS — S72032A Displaced midcervical fracture of left femur, initial encounter for closed fracture: Secondary | ICD-10-CM | POA: Diagnosis not present

## 2018-03-02 DIAGNOSIS — S99912A Unspecified injury of left ankle, initial encounter: Secondary | ICD-10-CM | POA: Diagnosis not present

## 2018-03-02 DIAGNOSIS — I10 Essential (primary) hypertension: Secondary | ICD-10-CM | POA: Diagnosis present

## 2018-03-02 DIAGNOSIS — M810 Age-related osteoporosis without current pathological fracture: Secondary | ICD-10-CM | POA: Diagnosis not present

## 2018-03-02 DIAGNOSIS — R339 Retention of urine, unspecified: Secondary | ICD-10-CM | POA: Diagnosis not present

## 2018-03-02 DIAGNOSIS — D649 Anemia, unspecified: Secondary | ICD-10-CM | POA: Diagnosis not present

## 2018-03-02 DIAGNOSIS — Z880 Allergy status to penicillin: Secondary | ICD-10-CM

## 2018-03-02 DIAGNOSIS — S7292XA Unspecified fracture of left femur, initial encounter for closed fracture: Secondary | ICD-10-CM | POA: Diagnosis not present

## 2018-03-02 DIAGNOSIS — S72092A Other fracture of head and neck of left femur, initial encounter for closed fracture: Secondary | ICD-10-CM | POA: Diagnosis not present

## 2018-03-02 DIAGNOSIS — M25571 Pain in right ankle and joints of right foot: Secondary | ICD-10-CM | POA: Diagnosis not present

## 2018-03-02 DIAGNOSIS — S72012D Unspecified intracapsular fracture of left femur, subsequent encounter for closed fracture with routine healing: Secondary | ICD-10-CM | POA: Diagnosis not present

## 2018-03-02 DIAGNOSIS — M2550 Pain in unspecified joint: Secondary | ICD-10-CM | POA: Diagnosis not present

## 2018-03-02 DIAGNOSIS — S82892A Other fracture of left lower leg, initial encounter for closed fracture: Secondary | ICD-10-CM | POA: Diagnosis not present

## 2018-03-02 DIAGNOSIS — W19XXXA Unspecified fall, initial encounter: Secondary | ICD-10-CM

## 2018-03-02 DIAGNOSIS — Z96642 Presence of left artificial hip joint: Secondary | ICD-10-CM | POA: Diagnosis not present

## 2018-03-02 DIAGNOSIS — R52 Pain, unspecified: Secondary | ICD-10-CM | POA: Diagnosis not present

## 2018-03-02 DIAGNOSIS — R5381 Other malaise: Secondary | ICD-10-CM | POA: Diagnosis not present

## 2018-03-02 DIAGNOSIS — M25552 Pain in left hip: Secondary | ICD-10-CM | POA: Diagnosis present

## 2018-03-02 DIAGNOSIS — M7989 Other specified soft tissue disorders: Secondary | ICD-10-CM | POA: Diagnosis not present

## 2018-03-02 LAB — CBC
HCT: 43.4 % (ref 36.0–46.0)
Hemoglobin: 13.8 g/dL (ref 12.0–15.0)
MCH: 29.4 pg (ref 26.0–34.0)
MCHC: 31.8 g/dL (ref 30.0–36.0)
MCV: 92.5 fL (ref 80.0–100.0)
NRBC: 0 % (ref 0.0–0.2)
Platelets: 221 10*3/uL (ref 150–400)
RBC: 4.69 MIL/uL (ref 3.87–5.11)
RDW: 13.5 % (ref 11.5–15.5)
WBC: 20.1 10*3/uL — ABNORMAL HIGH (ref 4.0–10.5)

## 2018-03-02 LAB — TYPE AND SCREEN
ABO/RH(D): O NEG
Antibody Screen: NEGATIVE

## 2018-03-02 LAB — APTT: aPTT: 33 seconds (ref 24–36)

## 2018-03-02 LAB — BASIC METABOLIC PANEL
Anion gap: 5 (ref 5–15)
BUN: 17 mg/dL (ref 8–23)
CALCIUM: 8.8 mg/dL — AB (ref 8.9–10.3)
CO2: 28 mmol/L (ref 22–32)
Chloride: 106 mmol/L (ref 98–111)
Creatinine, Ser: 0.93 mg/dL (ref 0.44–1.00)
GFR calc Af Amer: >60 mL/min (ref >60)
GFR calc non Af Amer: 55 mL/min — ABNORMAL LOW (ref >60)
Glucose, Bld: 121 mg/dL — ABNORMAL HIGH (ref 70–99)
Potassium: 3.7 mmol/L (ref 3.5–5.1)
Sodium: 139 mmol/L (ref 135–145)

## 2018-03-02 LAB — SURGICAL PCR SCREEN
MRSA, PCR: POSITIVE — AB
Staphylococcus aureus: POSITIVE — AB

## 2018-03-02 LAB — PROTIME-INR
INR: 1.07
Prothrombin Time: 13.8 seconds (ref 11.4–15.2)

## 2018-03-02 MED ORDER — HYDROCODONE-ACETAMINOPHEN 5-325 MG PO TABS: 1.0000 | ORAL_TABLET | ORAL | Status: DC | PRN

## 2018-03-02 MED ORDER — MORPHINE SULFATE (PF) 2 MG/ML IV SOLN: 2.0000 mg | INTRAVENOUS | Status: DC | PRN

## 2018-03-02 MED ORDER — SODIUM CHLORIDE 0.9 % IV BOLUS
1000.0000 mL | Freq: Once | INTRAVENOUS | Status: AC
  Administered 2018-03-02: 1000 mL via INTRAVENOUS

## 2018-03-02 MED ORDER — ACETAMINOPHEN 650 MG RE SUPP: 650.0000 mg | Freq: Four times a day (QID) | RECTAL | Status: DC | PRN

## 2018-03-02 MED ORDER — MORPHINE SULFATE (PF) 2 MG/ML IV SOLN: 1.0000 mg | INTRAVENOUS | Status: DC | PRN

## 2018-03-02 MED ORDER — POLYETHYLENE GLYCOL 3350 17 G PO PACK
17.0000 g | PACK | Freq: Every day | ORAL | Status: DC | PRN
  Administered 2018-03-06: 17 g via ORAL
  Filled 2018-03-02: qty 1

## 2018-03-02 MED ORDER — CLINDAMYCIN PHOSPHATE 600 MG/50ML IV SOLN
600.0000 mg | INTRAVENOUS | Status: AC
  Administered 2018-03-03: 600 mg via INTRAVENOUS
  Filled 2018-03-02: qty 50

## 2018-03-02 MED ORDER — MUPIROCIN 2 % EX OINT
1.0000 "application " | TOPICAL_OINTMENT | Freq: Two times a day (BID) | CUTANEOUS | Status: AC
  Administered 2018-03-02 – 2018-03-07 (×10): 1 via NASAL
  Filled 2018-03-02: qty 22

## 2018-03-02 MED ORDER — AMLODIPINE BESY-BENAZEPRIL HCL 10-20 MG PO CAPS: 1.0000 | ORAL_CAPSULE | Freq: Every day | ORAL | Status: DC

## 2018-03-02 MED ORDER — CHLORHEXIDINE GLUCONATE CLOTH 2 % EX PADS
6.0000 | MEDICATED_PAD | Freq: Every day | CUTANEOUS | Status: AC
  Administered 2018-03-03 – 2018-03-07 (×5): 6 via TOPICAL

## 2018-03-02 MED ORDER — CHLORHEXIDINE GLUCONATE 4 % EX LIQD
1.0000 "application " | Freq: Once | CUTANEOUS | Status: AC
  Administered 2018-03-03: 1 via TOPICAL

## 2018-03-02 MED ORDER — HEPARIN SODIUM (PORCINE) 5000 UNIT/ML IJ SOLN
5000.0000 [IU] | Freq: Three times a day (TID) | INTRAMUSCULAR | Status: DC
  Administered 2018-03-02 – 2018-03-03 (×2): 5000 [IU] via SUBCUTANEOUS
  Filled 2018-03-02 (×2): qty 1

## 2018-03-02 MED ORDER — HYDROCODONE-ACETAMINOPHEN 5-325 MG PO TABS
1.0000 | ORAL_TABLET | ORAL | Status: DC | PRN
  Administered 2018-03-02: 1 via ORAL
  Filled 2018-03-02: qty 1

## 2018-03-02 MED ORDER — SODIUM CHLORIDE 0.9 % IV SOLN
INTRAVENOUS | Status: DC
  Administered 2018-03-02 – 2018-03-03 (×3): via INTRAVENOUS

## 2018-03-02 MED ORDER — VITAMIN D (ERGOCALCIFEROL) 1.25 MG (50000 UNIT) PO CAPS
50000.0000 [IU] | ORAL_CAPSULE | ORAL | Status: DC
  Administered 2018-03-03: 50000 [IU] via ORAL
  Filled 2018-03-02: qty 1

## 2018-03-02 MED ORDER — CEFAZOLIN SODIUM-DEXTROSE 2-4 GM/100ML-% IV SOLN
2.0000 g | INTRAVENOUS | Status: AC
  Administered 2018-03-03: 2 g via INTRAVENOUS
  Filled 2018-03-02: qty 100

## 2018-03-02 MED ORDER — ONDANSETRON HCL 4 MG/2ML IJ SOLN
4.0000 mg | Freq: Four times a day (QID) | INTRAMUSCULAR | Status: DC | PRN
  Administered 2018-03-03: 4 mg via INTRAVENOUS

## 2018-03-02 MED ORDER — BENAZEPRIL HCL 20 MG PO TABS
20.0000 mg | ORAL_TABLET | Freq: Every day | ORAL | Status: DC
  Administered 2018-03-02 – 2018-03-07 (×5): 20 mg via ORAL
  Filled 2018-03-02 (×7): qty 1

## 2018-03-02 MED ORDER — ACETAMINOPHEN 325 MG PO TABS: 650.0000 mg | ORAL_TABLET | Freq: Four times a day (QID) | ORAL | Status: DC | PRN

## 2018-03-02 MED ORDER — DOCUSATE SODIUM 100 MG PO CAPS
100.0000 mg | ORAL_CAPSULE | Freq: Two times a day (BID) | ORAL | Status: DC
  Administered 2018-03-02 – 2018-03-03 (×2): 100 mg via ORAL
  Filled 2018-03-02 (×2): qty 1

## 2018-03-02 MED ORDER — OXYCODONE-ACETAMINOPHEN 5-325 MG PO TABS
1.0000 | ORAL_TABLET | Freq: Once | ORAL | Status: AC
  Administered 2018-03-02: 1 via ORAL
  Filled 2018-03-02: qty 1

## 2018-03-02 MED ORDER — HYDRALAZINE HCL 20 MG/ML IJ SOLN: 10.0000 mg | INTRAMUSCULAR | Status: DC | PRN

## 2018-03-02 MED ORDER — ONDANSETRON HCL 4 MG PO TABS: 4.0000 mg | ORAL_TABLET | Freq: Four times a day (QID) | ORAL | Status: DC | PRN

## 2018-03-02 MED ORDER — AMLODIPINE BESYLATE 10 MG PO TABS
10.0000 mg | ORAL_TABLET | Freq: Every day | ORAL | Status: DC
  Administered 2018-03-02 – 2018-03-07 (×6): 10 mg via ORAL
  Filled 2018-03-02 (×7): qty 1

## 2018-03-02 NOTE — Progress Notes (Signed)
Family Meeting Note  Advance Directive:yes  Today a meeting took place with the Patient.  Patient is able to participate  The following clinical team members were present during this meeting:MD  The following were discussed:Patient's diagnosis:hip pain , Patient's progosis: Unable to determine and Goals for treatment: Full Code  Additional follow-up to be provided: prn  Time spent during discussion:20 minutes  Bertrum Sol, MD

## 2018-03-02 NOTE — ED Triage Notes (Signed)
Pt arrived via EMS from home d/t falling on her left hip outside of her home while speaking with neighbor. Pt left leg is shortened and rotated outwards. Pt is c/o left knee pain at this time. A&O x4 . Pt denies taking any blood thinning medication.

## 2018-03-02 NOTE — ED Provider Notes (Signed)
[facility] Emergency Department Provider Note  ____________________________________________  Time seen: Approximately 1:36 PM  I have reviewed the triage vital signs and the nursing notes.   HISTORY  Chief Complaint Fall    HPI Diane Rogers is a 85 y.o. female with a history of HTN, HL, who lost her balance when she was outside her home speaking with a neighbor.  She fell onto her left side and has been having left hip and ankle pain since then.  The triage notes that she had left knee pain but when I asked her, she does not have any left knee pain.  She denies any numbness or tingling.  She did not hit her head or lose consciousness.  She was unable to ambulate after the fall.  She had no associated chest pain, shortness of breath, palpitations, lightheadedness or syncope.  She denies any neck or back pain.  She has not tried anything for her pain.  Past Medical History:  Diagnosis Date  . Depression   . Glucose intolerance (impaired glucose tolerance)   . Hyperlipidemia   . Hypertension   . Ovarian failure     Patient Active Problem List   Diagnosis Date Noted  . Lower extremity edema 07/21/2015  . Depression, endogenous (HCC) 08/01/2014  . HLD (hyperlipidemia) 08/01/2014  . Benign essential HTN 09/12/2006    Past Surgical History:  Procedure Laterality Date  . NO PAST SURGERIES      Current Outpatient Rx  . Order #: [id number] Class: Normal  . Order #: [id number] Class: Normal  . Order #: [id number] Class: Normal    Allergies Patient has no known allergies.  History reviewed. No pertinent family history.  Social History Social History   Tobacco Use  . Smoking status: Never Smoker  . Smokeless tobacco: Never Used  Substance Use Topics  . Alcohol use: No    Alcohol/week: 0.0 standard drinks  . Drug use: No    Review of Systems Constitutional: No fever/chills.  No lightheadedness or syncope.  Positive mechanical fall. Eyes: No  visual changes.  No blurred or double vision. ENT: No sore throat. No congestion or rhinorrhea.  Acid of hard of hearing. Cardiovascular: Denies chest pain. Denies palpitations. Respiratory: Denies shortness of breath.  No cough. Gastrointestinal: No abdominal pain.  No nausea, no vomiting.  No diarrhea.  No constipation. Genitourinary: Negative for dysuria. Musculoskeletal: Negative for back pain.  Neck pain.  Positive left hip and left ankle pain.   Skin: Negative for rash. Neurological: Negative for headaches. No focal numbness, tingling or weakness.     ____________________________________________   PHYSICAL EXAM:  VITAL SIGNS: ED Triage Vitals  Enc Vitals Group     BP 03/02/18 1310 (!) 193/67     Pulse Rate 03/02/18 1258 80     Resp 03/02/18 1258 (!) 21     Temp 03/02/18 1258 97.8 F (36.6 C)     Temp Source 03/02/18 1258 Oral     SpO2 03/02/18 1258 99 %     Weight 03/02/18 1300 140 lb (63.5 kg)     Height 03/02/18 1300 5\' 1"  (1.549 m)     Head Circumference --      Peak Flow --      Pain Score 03/02/18 1300 7     Pain Loc --      Pain Edu? --      Excl. in GC? --     Constitutional: Alert with clear speech and able to answer questions appropriately.  GCS 15.   Eyes: Conjunctivae are normal.  EOMI. No scleral icterus.  No raccoon eyes. Head: Atraumatic.  No battle sign. Nose: No congestion/rhinnorhea.  No swelling over the nose or septal hematoma Mouth/Throat: Mucous membranes are moist.  No dental injury or malocclusion. Neck: No stridor.  Supple.  Midline C-spine tenderness palpation, step-offs or deformities. Cardiovascular: Normal rate, regular rhythm. No murmurs, rubs or gallops.  Respiratory: Normal respiratory effort.  No accessory muscle use or retractions. Lungs CTAB.  No wheezes, rales or ronchi. Gastrointestinal: Overweight.  Soft, nontender and nondistended.  No guarding or rebound.  No peritoneal signs. Musculoskeletal: Pelvis is stable.  The patient  has full range of motion of the right hip, right knee, and bilateral ankles.  She does have some pain with range of motion of the left ankle without any overlying swelling or skin changes.  The patient has pain in the left hip and is unable to move the left knee due to that there is no effusion over the left knee.  Normal DP and PT pulses on the left.  Normal sensation to light touch in the bilateral lower extremities.  No LE edema. No ttp in the calves or palpable cords.  Negative Homan's sign. Neurologic: Alert.Marland Kitchen  Speech is clear.  Face and smile are symmetric.  EOMI.  Moves all extremities well. Skin:  Skin is warm, dry and intact. No rash noted. Psychiatric: Mood and affect are normal.   ____________________________________________   LABS (all labs ordered are listed, but only abnormal results are displayed)  Labs Reviewed  CBC  BASIC METABOLIC PANEL  PROTIME-INR  APTT   ____________________________________________  EKG  None  ____________________________________________  RADIOLOGY  Dg Chest 1 View  Result Date: 03/02/2018 CLINICAL DATA:  The patient suffered a left hip fracture in a fall today. EXAM: CHEST  1 VIEW COMPARISON:  None. FINDINGS: The lungs are clear. Heart size is normal. Aortic atherosclerosis noted. No pneumothorax or pleural fluid. No acute or focal bony abnormality. IMPRESSION: No acute disease.  Aortic atherosclerosis. Electronically Signed   By: Drusilla Kanner M.D.   On: 03/02/2018 14:09   Dg Ankle Complete Left  Result Date: 03/02/2018 CLINICAL DATA:  Fall. EXAM: LEFT ANKLE COMPLETE - 3+ VIEW COMPARISON:  03/02/2018. FINDINGS: Soft tissue swelling noted lateral malleolus. Diffuse degenerative change. Calcaneal spurring. Tiny bony density adjacent to the lateral malleolus can not be excluded. This could represent a subtle avulsion fracture, age undetermined. For vascular calcification. IMPRESSION: 1. Soft tissue swelling over the lateral malleolus. Tiny bony  density adjacent to the lateral malleolus can not be excluded. This could represent subtle avulsion fracture, age undetermined. No acute abnormality otherwise noted. 2.  Peripheral vascular disease. Electronically Signed   By: Maisie Fus  Register   On: 03/02/2018 14:12   Dg Hip Unilat With Pelvis 2-3 Views Left  Result Date: 03/02/2018 CLINICAL DATA:  84 year old female with hip pain after a fall EXAM: DG HIP (WITH OR WITHOUT PELVIS) 2-3V LEFT COMPARISON:  None. FINDINGS: Osteopenia. Bony pelvic ring intact with no pelvic fracture identified. Left-sided subcapital hip fracture with minimal displacement. Right hip maintains alignment with mild degenerative changes. IMPRESSION: Left subcapital hip fracture with mild displacement. Osteopenia. Electronically Signed   By: Gilmer Mor D.O.   On: 03/02/2018 14:07    ____________________________________________   PROCEDURES  Procedure(s) performed: None  Procedures  Critical Care performed: No ____________________________________________   INITIAL IMPRESSION / ASSESSMENT AND PLAN / ED COURSE  Pertinent labs & imaging results that  were available during my care of the patient were reviewed by me and considered in my medical decision making (see chart for details).  85 y.o. female who fell because she lost her balance now with left hip and ankle pain.  Overall, I am concerned about a hip fracture and an x-ray has been ordered.  The patient will also have an x-ray of the left ankle.  I will treat her pain, and reevaluate her for final disposition.  Today, there is no evidence for syncope.  ----------------------------------------- 3:23 PM on 03/02/2018 -----------------------------------------  Left subcapital fracture with some displacement on her x-ray.  Her left ankle does not have any acute fracture.  At this time, plan to admit the patient to the hospitalist and speak to the orthopedist for final plan for her hip  fracture.  ----------------------------------------- 3:35 PM on 03/02/2018 -----------------------------------------  I spoke with Dr. Hyacinth MeekerMiller, who will plan to take the patient to surgery tomorrow.  I have updated the patient and her family on the plan.  ____________________________________________  FINAL CLINICAL IMPRESSION(S) / ED DIAGNOSES  Final diagnoses:  Subcapital fracture of hip, left, closed, initial encounter (HCC)  Fall, initial encounter  Acute left ankle pain         NEW MEDICATIONS STARTED DURING THIS VISIT:  New Prescriptions   No medications on file      Rockne MenghiniNorman, Anne-Caroline, MD 03/03/18 1513

## 2018-03-02 NOTE — Consult Note (Signed)
ORTHOPAEDIC CONSULTATION  REQUESTING PHYSICIAN: Salary, Evelena Asa, MD  Chief Complaint: Left hip pain  HPI: Diane Rogers is a 85 y.o. female who complains of left hip pain after a fall today in her yard while  going to visit her neighbor.  Patient was brought to the emergency room where exam and x-rays revealed a displaced subcapital fracture of the left hip.  She was admitted for medical clearance and possible surgery.  I discussed options with the patient and her daughters.  Risks and benefits of surgery versus nonoperative treatment were discussed and they would prefer to proceed with surgery.  Postop protocol was also discussed.  We will plan on surgery tomorrow afternoon.  Past Medical History:  Diagnosis Date  . Depression   . Glucose intolerance (impaired glucose tolerance)   . Hyperlipidemia   . Hypertension   . Ovarian failure    Past Surgical History:  Procedure Laterality Date  . NO PAST SURGERIES     Social History   Socioeconomic History  . Marital status: Widowed    Spouse name: Not on file  . Number of children: Not on file  . Years of education: Not on file  . Highest education level: Not on file  Occupational History  . Not on file  Social Needs  . Financial resource strain: Not on file  . Food insecurity:    Worry: Not on file    Inability: Not on file  . Transportation needs:    Medical: Not on file    Non-medical: Not on file  Tobacco Use  . Smoking status: Never Smoker  . Smokeless tobacco: Never Used  Substance and Sexual Activity  . Alcohol use: No    Alcohol/week: 0.0 standard drinks  . Drug use: No  . Sexual activity: Not on file  Lifestyle  . Physical activity:    Days per week: Not on file    Minutes per session: Not on file  . Stress: Not on file  Relationships  . Social connections:    Talks on phone: Not on file    Gets together: Not on file    Attends religious service: Not on file    Active member of club or  organization: Not on file    Attends meetings of clubs or organizations: Not on file    Relationship status: Not on file  Other Topics Concern  . Not on file  Social History Narrative  . Not on file   History reviewed. No pertinent family history. Allergies  Allergen Reactions  . Penicillins    Prior to Admission medications   Medication Sig Start Date End Date Taking? Authorizing Provider  amLODipine-benazepril (LOTREL) 10-20 MG capsule Take 1 capsule by mouth daily. 11/23/16  Yes Ellyn Hack, MD  hydrochlorothiazide (HYDRODIURIL) 25 MG tablet Take 1 tablet (25 mg total) by mouth daily. 11/23/16  Yes Ellyn Hack, MD  Vitamin D, Ergocalciferol, (DRISDOL) 50000 units CAPS capsule Take one capsule weekly For 12 weeks. 11/30/16   Ellyn Hack, MD   Dg Chest 1 View  Result Date: 03/02/2018 CLINICAL DATA:  The patient suffered a left hip fracture in a fall today. EXAM: CHEST  1 VIEW COMPARISON:  None. FINDINGS: The lungs are clear. Heart size is normal. Aortic atherosclerosis noted. No pneumothorax or pleural fluid. No acute or focal bony abnormality. IMPRESSION: No acute disease.  Aortic atherosclerosis. Electronically Signed   By: Drusilla Kanner M.D.   On: 03/02/2018 14:09  Dg Ankle Complete Left  Result Date: 03/02/2018 CLINICAL DATA:  Fall. EXAM: LEFT ANKLE COMPLETE - 3+ VIEW COMPARISON:  03/02/2018. FINDINGS: Soft tissue swelling noted lateral malleolus. Diffuse degenerative change. Calcaneal spurring. Tiny bony density adjacent to the lateral malleolus can not be excluded. This could represent a subtle avulsion fracture, age undetermined. For vascular calcification. IMPRESSION: 1. Soft tissue swelling over the lateral malleolus. Tiny bony density adjacent to the lateral malleolus can not be excluded. This could represent subtle avulsion fracture, age undetermined. No acute abnormality otherwise noted. 2.  Peripheral vascular disease. Electronically Signed   By: Maisie Fus   Register   On: 03/02/2018 14:12   Dg Hip Unilat With Pelvis 2-3 Views Left  Result Date: 03/02/2018 CLINICAL DATA:  85 year old female with hip pain after a fall EXAM: DG HIP (WITH OR WITHOUT PELVIS) 2-3V LEFT COMPARISON:  None. FINDINGS: Osteopenia. Bony pelvic ring intact with no pelvic fracture identified. Left-sided subcapital hip fracture with minimal displacement. Right hip maintains alignment with mild degenerative changes. IMPRESSION: Left subcapital hip fracture with mild displacement. Osteopenia. Electronically Signed   By: Gilmer Mor D.O.   On: 03/02/2018 14:07    Positive ROS: All other systems have been reviewed and were otherwise negative with the exception of those mentioned in the HPI and as above.  Physical Exam: General: Alert, no acute distress Cardiovascular: No pedal edema Respiratory: No cyanosis, no use of accessory musculature GI: No organomegaly, abdomen is soft and non-tender Skin: No lesions in the area of chief complaint Neurologic: Sensation intact distally Psychiatric: Patient is competent for consent with normal mood and affect Lymphatic: No axillary or cervical lymphadenopathy  MUSCULOSKELETAL: Patient is left leg is slightly shortened and rotated.  There is pain with motion.  Skin is intact.  Neurovascular status good.  No other injuries are noted.  Assessment: Displaced subcapital fracture left hip  Plan: Left hip hemiarthroplasty tomorrow.    Valinda Hoar, MD [phone]   03/02/2018 6:28 PM

## 2018-03-02 NOTE — H&P (Signed)
[facility] - [place] at [facility]   PATIENT NAME: Diane Rogers    MR#:  [id number]  DATE OF BIRTH:  02/01/1929  DATE OF ADMISSION:  03/02/2018  PRIMARY CARE PHYSICIAN: [facility]   REQUESTING/REFERRING PHYSICIAN:   CHIEF COMPLAINT:   Chief Complaint  Patient presents with  . Fall    HISTORY OF PRESENT ILLNESS: Diane Pokeleanor Lysne  is a 85 y.o. female with a known history per below status post fall from standing position earlier today, complaining of left ankle and hip pain, in the emergency room patient was noted to have acute left hip fracture, questionable left avulsion fracture of the malleolus, ED attending did discuss with orthopedic surgery/Dr. Hyacinth MeekerMiller whom is planning possible operative intervention on tomorrow, patient evaluated in the emergency room, multiple family members present, patient admitted for acute left hip fracture.  PAST MEDICAL HISTORY:   Past Medical History:  Diagnosis Date  . Depression   . Glucose intolerance (impaired glucose tolerance)   . Hyperlipidemia   . Hypertension   . Ovarian failure     PAST SURGICAL HISTORY:  Past Surgical History:  Procedure Laterality Date  . NO PAST SURGERIES      SOCIAL HISTORY:  Social History   Tobacco Use  . Smoking status: Never Smoker  . Smokeless tobacco: Never Used  Substance Use Topics  . Alcohol use: No    Alcohol/week: 0.0 standard drinks    FAMILY HISTORY:  HTN  DRUG ALLERGIES: No Known Allergies  REVIEW OF SYSTEMS:   CONSTITUTIONAL: No fever, fatigue or weakness.  EYES: No blurred or double vision.  EARS, NOSE, AND THROAT: No tinnitus or ear pain.  RESPIRATORY: No cough, shortness of breath, wheezing or hemoptysis.  CARDIOVASCULAR: No chest pain, orthopnea, edema.  GASTROINTESTINAL: No nausea, vomiting, diarrhea or abdominal pain.  GENITOURINARY: No dysuria, hematuria.  ENDOCRINE: No polyuria, nocturia,  HEMATOLOGY: No anemia, easy bruising or  bleeding SKIN: No rash or lesion. MUSCULOSKELETAL: Left hip/left ankle pain    NEUROLOGIC: No tingling, numbness, weakness.  PSYCHIATRY: No anxiety or depression.   MEDICATIONS AT HOME:  Prior to Admission medications   Medication Sig Start Date End Date Taking? Authorizing Provider  amLODipine-benazepril (LOTREL) 10-20 MG capsule Take 1 capsule by mouth daily. 11/23/16  Yes Ellyn HackShah, Syed Asad A, MD  hydrochlorothiazide (HYDRODIURIL) 25 MG tablet Take 1 tablet (25 mg total) by mouth daily. 11/23/16  Yes Ellyn HackShah, Syed Asad A, MD  Vitamin D, Ergocalciferol, (DRISDOL) 50000 units CAPS capsule Take one capsule weekly For 12 weeks. 11/30/16   Ellyn HackShah, Syed Asad A, MD      PHYSICAL EXAMINATION:   VITAL SIGNS: Blood pressure (!) 193/67, pulse 79, temperature 97.8 F (36.6 C), temperature source Oral, resp. rate 14, height 5\' 1"  (1.549 m), weight 63.5 kg, SpO2 98 %.  GENERAL:  85 y.o.-year-old patient lying in the bed with no acute distress.  Obese, frail-appearing eYES: Pupils equal, round, reactive to light and accommodation. No scleral icterus. Extraocular muscles intact.  HEENT: Head atraumatic, normocephalic. Oropharynx and nasopharynx clear.  NECK:  Supple, no jugular venous distention. No thyroid enlargement, no tenderness.  LUNGS: Normal breath sounds bilaterally, no wheezing, rales,rhonchi or crepitation. No use of accessory muscles of respiration.  CARDIOVASCULAR: S1, S2 normal. No murmurs, rubs, or gallops.  ABDOMEN: Soft, nontender, nondistended. Bowel sounds present. No organomegaly or mass.  EXTREMITIES: No pedal edema, cyanosis, or clubbing.  Creased range of motion lower extremities, left hip tenderness, left ankle tenderness  nEUROLOGIC: Cranial nerves II through XII are intact. MAES  PSYCHIATRIC: The patient is alert and oriented x 3.  SKIN: No obvious rash, lesion, or ulcer.   LABORATORY PANEL:   CBC No results for input(s): WBC, HGB, HCT, PLT, MCV, MCH, MCHC, RDW, LYMPHSABS,  MONOABS, EOSABS, BASOSABS, BANDABS in the last 168 hours.  Invalid input(s): NEUTRABS, BANDSABD ------------------------------------------------------------------------------------------------------------------  Chemistries  No results for input(s): NA, K, CL, CO2, GLUCOSE, BUN, CREATININE, CALCIUM, MG, AST, ALT, ALKPHOS, BILITOT in the last 168 hours.  Invalid input(s): GFRCGP ------------------------------------------------------------------------------------------------------------------ CrCl cannot be calculated (Patient's most recent lab result is older than the maximum 21 days allowed.). ------------------------------------------------------------------------------------------------------------------ No results for input(s): TSH, T4TOTAL, T3FREE, THYROIDAB in the last 72 hours.  Invalid input(s): FREET3   Coagulation profile No results for input(s): INR, PROTIME in the last 168 hours. ------------------------------------------------------------------------------------------------------------------- No results for input(s): DDIMER in the last 72 hours. -------------------------------------------------------------------------------------------------------------------  Cardiac Enzymes No results for input(s): CKMB, TROPONINI, MYOGLOBIN in the last 168 hours.  Invalid input(s): CK ------------------------------------------------------------------------------------------------------------------ Invalid input(s): POCBNP  ---------------------------------------------------------------------------------------------------------------  Urinalysis No results found for: COLORURINE, APPEARANCEUR, LABSPEC, PHURINE, GLUCOSEU, HGBUR, BILIRUBINUR, KETONESUR, PROTEINUR, UROBILINOGEN, NITRITE, LEUKOCYTESUR   RADIOLOGY: Dg Chest 1 View  Result Date: 03/02/2018 CLINICAL DATA:  The patient suffered a left hip fracture in a fall today. EXAM: CHEST  1 VIEW COMPARISON:  None. FINDINGS: The lungs  are clear. Heart size is normal. Aortic atherosclerosis noted. No pneumothorax or pleural fluid. No acute or focal bony abnormality. IMPRESSION: No acute disease.  Aortic atherosclerosis. Electronically Signed   By: Drusilla Kanner M.D.   On: 03/02/2018 14:09   Dg Ankle Complete Left  Result Date: 03/02/2018 CLINICAL DATA:  Fall. EXAM: LEFT ANKLE COMPLETE - 3+ VIEW COMPARISON:  03/02/2018. FINDINGS: Soft tissue swelling noted lateral malleolus. Diffuse degenerative change. Calcaneal spurring. Tiny bony density adjacent to the lateral malleolus can not be excluded. This could represent a subtle avulsion fracture, age undetermined. For vascular calcification. IMPRESSION: 1. Soft tissue swelling over the lateral malleolus. Tiny bony density adjacent to the lateral malleolus can not be excluded. This could represent subtle avulsion fracture, age undetermined. No acute abnormality otherwise noted. 2.  Peripheral vascular disease. Electronically Signed   By: Maisie Fus  Register   On: 03/02/2018 14:12   Dg Hip Unilat With Pelvis 2-3 Views Left  Result Date: 03/02/2018 CLINICAL DATA:  85 year old female with hip pain after a fall EXAM: DG HIP (WITH OR WITHOUT PELVIS) 2-3V LEFT COMPARISON:  None. FINDINGS: Osteopenia. Bony pelvic ring intact with no pelvic fracture identified. Left-sided subcapital hip fracture with minimal displacement. Right hip maintains alignment with mild degenerative changes. IMPRESSION: Left subcapital hip fracture with mild displacement. Osteopenia. Electronically Signed   By: Gilmer Mor D.O.   On: 03/02/2018 14:07    EKG: Orders placed or performed during the hospital encounter of 03/02/18  . ED EKG  . ED EKG    IMPRESSION AND PLAN: *acute left hip fracture Status post mechanical fall Admit to regular nursing for bed, orthopedic surgery to see, n.p.o. after midnight, adult pain protocol, per ED attending-probable operative intervention in the morning  *Acute left ankle  pain X-ray suspicious for left avulsion fracture of the malleolus No pain protocol, orthopedic surgery to see  *Accelerated hypertension Exacerbated by pain Continue home regiment, IV hydralazine PRN, adult pain protocol PRN for pain, vitals per routine, make changes as per necessary  *History of hyperlipidemia Check lipids in the morning   All the records are reviewed and case  discussed with ED provider. Management plans discussed with the patient, family and they are in agreement.  CODE STATUS:full Advance Directive Documentation     Most Recent Value  Type of Advance Directive  Healthcare Power of Attorney  Pre-existing out of facility DNR order (yellow form or pink MOST form)  -  "MOST" Form in Place?  -       TOTAL TIME TAKING CARE OF THIS PATIENT: 35 minutes.    Evelena Asa Salary M.D on 03/02/2018   Between 7am to 6pm - Pager - [phone]  After 6pm go to [email or web address] - password [email or web address]  [facility]  Office  [phone]  CC: Primary care physician; [facility], [place]   Note: This dictation was prepared with Dragon dictation along with smaller phrase technology. Any transcriptional errors that result from this process are unintentional.

## 2018-03-03 ENCOUNTER — Inpatient Hospital Stay: Payer: Medicare HMO

## 2018-03-03 ENCOUNTER — Inpatient Hospital Stay: Payer: Medicare HMO | Admitting: Anesthesiology

## 2018-03-03 ENCOUNTER — Encounter
Admission: EM | Disposition: A | Discharge status: Skilled Nursing Facility | Payer: Self-pay | Source: Home / Self Care | Attending: Internal Medicine

## 2018-03-03 ENCOUNTER — Encounter: Payer: Self-pay | Admitting: *Deleted

## 2018-03-03 HISTORY — PX: HIP ARTHROPLASTY: SHX981

## 2018-03-03 LAB — CBC
HCT: 41.6 % (ref 36.0–46.0)
HEMATOCRIT: 41 % (ref 36.0–46.0)
Hemoglobin: 13 g/dL (ref 12.0–15.0)
Hemoglobin: 13.1 g/dL (ref 12.0–15.0)
MCH: 28.9 pg (ref 26.0–34.0)
MCH: 29.1 pg (ref 26.0–34.0)
MCHC: 31.7 g/dL (ref 30.0–36.0)
MCHC: 31.5 g/dL (ref 30.0–36.0)
MCV: 91.1 fL (ref 80.0–100.0)
MCV: 92.4 fL (ref 80.0–100.0)
Platelets: 216 10*3/uL (ref 150–400)
Platelets: 209 10*3/uL (ref 150–400)
RBC: 4.5 MIL/uL (ref 3.87–5.11)
RBC: 4.5 MIL/uL (ref 3.87–5.11)
RDW: 13.3 % (ref 11.5–15.5)
RDW: 13.5 % (ref 11.5–15.5)
WBC: 12.8 10*3/uL — ABNORMAL HIGH (ref 4.0–10.5)
WBC: 19 10*3/uL — ABNORMAL HIGH (ref 4.0–10.5)
nRBC: 0 % (ref 0.0–0.2)
nRBC: 0 % (ref 0.0–0.2)

## 2018-03-03 LAB — CREATININE, SERUM
Creatinine, Ser: 0.87 mg/dL (ref 0.44–1.00)
GFR calc Af Amer: >60 mL/min (ref >60)
GFR calc non Af Amer: 60 mL/min — ABNORMAL LOW (ref >60)

## 2018-03-03 LAB — GLUCOSE, CAPILLARY: GLUCOSE-CAPILLARY: 110 mg/dL — AB (ref 70–99)

## 2018-03-03 SURGERY — HEMIARTHROPLASTY, HIP, DIRECT ANTERIOR APPROACH, FOR FRACTURE
Anesthesia: Spinal | Site: Hip | Laterality: Left

## 2018-03-03 MED ORDER — DOCUSATE SODIUM 100 MG PO CAPS
100.0000 mg | ORAL_CAPSULE | Freq: Two times a day (BID) | ORAL | Status: DC
  Administered 2018-03-03 – 2018-03-06 (×6): 100 mg via ORAL
  Filled 2018-03-03 (×7): qty 1

## 2018-03-03 MED ORDER — PROPOFOL 10 MG/ML IV BOLUS
INTRAVENOUS | Status: DC | PRN
  Administered 2018-03-03 (×2): 20 mg via INTRAVENOUS
  Administered 2018-03-03: 10 mg via INTRAVENOUS

## 2018-03-03 MED ORDER — CLINDAMYCIN PHOSPHATE 600 MG/50ML IV SOLN
INTRAVENOUS | Status: AC
  Filled 2018-03-03: qty 50

## 2018-03-03 MED ORDER — PROPOFOL 500 MG/50ML IV EMUL
INTRAVENOUS | Status: DC | PRN
  Administered 2018-03-03: 75 ug/kg/min via INTRAVENOUS

## 2018-03-03 MED ORDER — ONDANSETRON HCL 4 MG/2ML IJ SOLN: 4.0000 mg | Freq: Once | INTRAMUSCULAR | Status: DC | PRN

## 2018-03-03 MED ORDER — CELECOXIB 200 MG PO CAPS
200.0000 mg | ORAL_CAPSULE | Freq: Two times a day (BID) | ORAL | Status: DC
  Administered 2018-03-03 – 2018-03-07 (×8): 200 mg via ORAL
  Filled 2018-03-03 (×8): qty 1

## 2018-03-03 MED ORDER — SODIUM CHLORIDE 0.45 % IV SOLN
INTRAVENOUS | Status: DC
  Administered 2018-03-03 – 2018-03-04 (×2): via INTRAVENOUS

## 2018-03-03 MED ORDER — ENOXAPARIN SODIUM 30 MG/0.3ML ~~LOC~~ SOLN
30.0000 mg | SUBCUTANEOUS | Status: DC
  Administered 2018-03-04 – 2018-03-06 (×3): 30 mg via SUBCUTANEOUS
  Filled 2018-03-03 (×3): qty 0.3

## 2018-03-03 MED ORDER — MENTHOL 3 MG MT LOZG
1.0000 | LOZENGE | OROMUCOSAL | Status: DC | PRN
  Filled 2018-03-03: qty 9

## 2018-03-03 MED ORDER — METHOCARBAMOL 500 MG PO TABS
500.0000 mg | ORAL_TABLET | Freq: Four times a day (QID) | ORAL | Status: DC | PRN
  Administered 2018-03-06: 500 mg via ORAL
  Filled 2018-03-03: qty 1

## 2018-03-03 MED ORDER — ZOLPIDEM TARTRATE 5 MG PO TABS: 5.0000 mg | ORAL_TABLET | Freq: Every evening | ORAL | Status: DC | PRN

## 2018-03-03 MED ORDER — LACTATED RINGERS IV SOLN
INTRAVENOUS | Status: DC | PRN
  Administered 2018-03-03: 13:00:00 via INTRAVENOUS

## 2018-03-03 MED ORDER — FLEET ENEMA 7-19 GM/118ML RE ENEM: 1.0000 | ENEMA | Freq: Once | RECTAL | Status: DC | PRN

## 2018-03-03 MED ORDER — ALUM & MAG HYDROXIDE-SIMETH 200-200-20 MG/5ML PO SUSP
30.0000 mL | ORAL | Status: DC | PRN
  Filled 2018-03-03: qty 30

## 2018-03-03 MED ORDER — VANCOMYCIN HCL 1000 MG IV SOLR
INTRAVENOUS | Status: DC | PRN
  Administered 2018-03-03: 1000 mg via INTRAVENOUS

## 2018-03-03 MED ORDER — METOCLOPRAMIDE HCL 10 MG PO TABS: 5.0000 mg | ORAL_TABLET | Freq: Three times a day (TID) | ORAL | Status: DC | PRN

## 2018-03-03 MED ORDER — SODIUM CHLORIDE 0.9 % IV SOLN
INTRAVENOUS | Status: DC | PRN
  Administered 2018-03-03: 20 ug/min via INTRAVENOUS

## 2018-03-03 MED ORDER — VANCOMYCIN HCL 1000 MG IV SOLR
INTRAVENOUS | Status: AC
  Filled 2018-03-03: qty 1000

## 2018-03-03 MED ORDER — DEXAMETHASONE SODIUM PHOSPHATE 10 MG/ML IJ SOLN
INTRAMUSCULAR | Status: DC | PRN
  Administered 2018-03-03: 10 mg via INTRAVENOUS

## 2018-03-03 MED ORDER — FENTANYL CITRATE (PF) 100 MCG/2ML IJ SOLN: 25.0000 ug | INTRAMUSCULAR | Status: DC | PRN

## 2018-03-03 MED ORDER — VANCOMYCIN HCL IN DEXTROSE 1-5 GM/200ML-% IV SOLN
1000.0000 mg | Freq: Two times a day (BID) | INTRAVENOUS | Status: AC
  Administered 2018-03-04 – 2018-03-05 (×3): 1000 mg via INTRAVENOUS
  Filled 2018-03-03 (×5): qty 200

## 2018-03-03 MED ORDER — PHENOL 1.4 % MT LIQD
1.0000 | OROMUCOSAL | Status: DC | PRN
  Filled 2018-03-03: qty 177

## 2018-03-03 MED ORDER — PROPOFOL 500 MG/50ML IV EMUL
INTRAVENOUS | Status: AC
  Filled 2018-03-03: qty 50

## 2018-03-03 MED ORDER — PHENYLEPHRINE HCL 10 MG/ML IJ SOLN: INTRAMUSCULAR | Status: DC | PRN

## 2018-03-03 MED ORDER — ONDANSETRON HCL 4 MG/2ML IJ SOLN: 4.0000 mg | Freq: Four times a day (QID) | INTRAMUSCULAR | Status: DC | PRN

## 2018-03-03 MED ORDER — SODIUM CHLORIDE FLUSH 0.9 % IV SOLN
INTRAVENOUS | Status: AC
  Filled 2018-03-03: qty 10

## 2018-03-03 MED ORDER — METHOCARBAMOL 1000 MG/10ML IJ SOLN
500.0000 mg | Freq: Four times a day (QID) | INTRAVENOUS | Status: DC | PRN
  Filled 2018-03-03: qty 5

## 2018-03-03 MED ORDER — PHENYLEPHRINE HCL 10 MG/ML IJ SOLN
INTRAMUSCULAR | Status: DC | PRN
  Administered 2018-03-03: 100 ug via INTRAVENOUS
  Administered 2018-03-03: 50 ug via INTRAVENOUS
  Administered 2018-03-03 (×2): 100 ug via INTRAVENOUS

## 2018-03-03 MED ORDER — BUPIVACAINE-EPINEPHRINE (PF) 0.25% -1:200000 IJ SOLN
INTRAMUSCULAR | Status: DC | PRN
  Administered 2018-03-03: 30 mL

## 2018-03-03 MED ORDER — ONDANSETRON HCL 4 MG PO TABS: 4.0000 mg | ORAL_TABLET | Freq: Four times a day (QID) | ORAL | Status: DC | PRN

## 2018-03-03 MED ORDER — PROPOFOL 10 MG/ML IV BOLUS
INTRAVENOUS | Status: AC
  Filled 2018-03-03: qty 20

## 2018-03-03 MED ORDER — GLYCOPYRROLATE 0.2 MG/ML IJ SOLN
INTRAMUSCULAR | Status: DC | PRN
  Administered 2018-03-03 (×2): 0.1 mg via INTRAVENOUS

## 2018-03-03 MED ORDER — HYDROCODONE-ACETAMINOPHEN 5-325 MG PO TABS
1.0000 | ORAL_TABLET | Freq: Four times a day (QID) | ORAL | Status: DC | PRN
  Administered 2018-03-04 – 2018-03-06 (×5): 1 via ORAL
  Filled 2018-03-03 (×5): qty 1

## 2018-03-03 MED ORDER — MORPHINE SULFATE (PF) 2 MG/ML IV SOLN: 1.0000 mg | INTRAVENOUS | Status: DC | PRN

## 2018-03-03 MED ORDER — FERROUS SULFATE 325 (65 FE) MG PO TABS
325.0000 mg | ORAL_TABLET | Freq: Every day | ORAL | Status: DC
  Administered 2018-03-04 – 2018-03-07 (×4): 325 mg via ORAL
  Filled 2018-03-03 (×4): qty 1

## 2018-03-03 MED ORDER — ACETAMINOPHEN 325 MG PO TABS: 325.0000 mg | ORAL_TABLET | Freq: Four times a day (QID) | ORAL | Status: DC | PRN

## 2018-03-03 MED ORDER — METOCLOPRAMIDE HCL 5 MG/ML IJ SOLN: 5.0000 mg | Freq: Three times a day (TID) | INTRAMUSCULAR | Status: DC | PRN

## 2018-03-03 MED ORDER — HYDROCODONE-ACETAMINOPHEN 7.5-325 MG PO TABS
1.0000 | ORAL_TABLET | Freq: Four times a day (QID) | ORAL | Status: DC | PRN
  Administered 2018-03-07: 1 via ORAL
  Filled 2018-03-03: qty 1

## 2018-03-03 MED ORDER — GABAPENTIN 300 MG PO CAPS
300.0000 mg | ORAL_CAPSULE | Freq: Three times a day (TID) | ORAL | Status: DC
  Administered 2018-03-03 – 2018-03-06 (×7): 300 mg via ORAL
  Filled 2018-03-03 (×8): qty 1

## 2018-03-03 MED ORDER — BISACODYL 10 MG RE SUPP
10.0000 mg | Freq: Every day | RECTAL | Status: DC | PRN
  Administered 2018-03-05 – 2018-03-06 (×2): 10 mg via RECTAL
  Filled 2018-03-03 (×3): qty 1

## 2018-03-03 SURGICAL SUPPLY — 59 items
BAG COUNTER SPONGE EZ (MISCELLANEOUS) ×2 IMPLANT
BLADE DEBAKEY 8.0 (BLADE) ×2 IMPLANT
BLADE DEBAKEY 8.0MM (BLADE) ×1
BLADE SAGITTAL WIDE XTHICK NO (BLADE) ×3 IMPLANT
BLADE SURG SZ10 CARB STEEL (BLADE) ×3 IMPLANT
CANISTER SUCT 1200ML W/VALVE (MISCELLANEOUS) ×9 IMPLANT
CHLORAPREP W/TINT 26ML (MISCELLANEOUS) ×6 IMPLANT
COUNTER SPONGE BAG EZ (MISCELLANEOUS) ×1
COVER WAND RF STERILE (DRAPES) ×3 IMPLANT
DRAPE INCISE IOBAN 66X60 STRL (DRAPES) ×6 IMPLANT
DRAPE TABLE BACK 80X90 (DRAPES) ×3 IMPLANT
DRSG AQUACEL AG ADV 3.5X10 (GAUZE/BANDAGES/DRESSINGS) ×3 IMPLANT
DRSG AQUACEL AG ADV 3.5X14 (GAUZE/BANDAGES/DRESSINGS) ×3 IMPLANT
ELECT BLADE 6.5 EXT (BLADE) ×3 IMPLANT
ELECT CAUTERY BLADE 6.4 (BLADE) ×3 IMPLANT
ELECT REM PT RETURN 9FT ADLT (ELECTROSURGICAL) ×3
ELECTRODE REM PT RTRN 9FT ADLT (ELECTROSURGICAL) ×1 IMPLANT
GAUZE PETRO XEROFOAM 1X8 (MISCELLANEOUS) ×6 IMPLANT
GAUZE SPONGE 4X4 12PLY STRL (GAUZE/BANDAGES/DRESSINGS) ×3 IMPLANT
GLOVE INDICATOR 8.0 STRL GRN (GLOVE) ×3 IMPLANT
GLOVE SURG ORTHO 8.5 STRL (GLOVE) ×3 IMPLANT
GOWN STRL REUS W/ TWL LRG LVL3 (GOWN DISPOSABLE) ×2 IMPLANT
GOWN STRL REUS W/TWL LRG LVL3 (GOWN DISPOSABLE) ×4
GOWN STRL REUS W/TWL LRG LVL4 (GOWN DISPOSABLE) ×3 IMPLANT
HEAD MODULAR ENDO (Orthopedic Implant) ×2 IMPLANT
HEAD UNPLR 44XMDLR STRL HIP (Orthopedic Implant) IMPLANT
HEMOVAC 400CC 10FR (MISCELLANEOUS) ×3 IMPLANT
IV NS 1000ML (IV SOLUTION) ×2
IV NS 1000ML BAXH (IV SOLUTION) ×1 IMPLANT
KIT TURNOVER KIT A (KITS) ×3 IMPLANT
NDL FILTER BLUNT 18X1 1/2 (NEEDLE) ×1 IMPLANT
NDL MAYO CATGUT SZ4 TPR NDL (NEEDLE) ×1 IMPLANT
NDL SPNL 18GX3.5 QUINCKE PK (NEEDLE) ×2 IMPLANT
NEEDLE FILTER BLUNT 18X 1/2SAF (NEEDLE) ×2
NEEDLE FILTER BLUNT 18X1 1/2 (NEEDLE) ×1 IMPLANT
NEEDLE MAYO CATGUT SZ4 (NEEDLE) ×3 IMPLANT
NEEDLE SPNL 18GX3.5 QUINCKE PK (NEEDLE) ×6 IMPLANT
NS IRRIG 1000ML POUR BTL (IV SOLUTION) ×3 IMPLANT
PACK HIP PROSTHESIS (MISCELLANEOUS) ×3 IMPLANT
PAD ABD DERMACEA PRESS 5X9 (GAUZE/BANDAGES/DRESSINGS) ×6 IMPLANT
PULSAVAC PLUS IRRIG FAN TIP (DISPOSABLE) ×3
SLEEVE UNITRAX V40 (Orthopedic Implant) ×2 IMPLANT
SLEEVE UNITRAX V40 +4 (Orthopedic Implant) IMPLANT
SOL PREP PVP 2OZ (MISCELLANEOUS) ×3
SOLUTION PREP PVP 2OZ (MISCELLANEOUS) ×1 IMPLANT
STAPLER SKIN PROX 35W (STAPLE) ×3 IMPLANT
STEM FEM ACCOLADE 38X102X30 S3 (Stem) ×2 IMPLANT
SUT DVC 2 QUILL PDO  T11 36X36 (SUTURE) ×4
SUT DVC 2 QUILL PDO T11 36X36 (SUTURE) ×2 IMPLANT
SUT QUILL PDO 0 36 36 VIOLET (SUTURE) ×3 IMPLANT
SUT TICRON 2-0 30IN 311381 (SUTURE) ×15 IMPLANT
SYR 10ML LL (SYRINGE) ×3 IMPLANT
SYR 30ML LL (SYRINGE) ×3 IMPLANT
SYR 50ML LL SCALE MARK (SYRINGE) ×3 IMPLANT
TAPE MICROFOAM 4IN (TAPE) ×3 IMPLANT
TIP BRUSH PULSAVAC PLUS 24.33 (MISCELLANEOUS) ×3 IMPLANT
TIP FAN IRRIG PULSAVAC PLUS (DISPOSABLE) ×1 IMPLANT
TUBE SUCT KAM VAC (TUBING) ×3 IMPLANT
WATER STERILE IRR 1000ML POUR (IV SOLUTION) ×3 IMPLANT

## 2018-03-03 NOTE — Clinical Social Work Note (Signed)
Clinical Social Work Assessment  Patient Details  Name: Diane Rogers MRN: [id number] Date of Birth: [birth year]  Date of referral:  03/03/18               Reason for consult:  Facility Placement                Permission sought to share information with:    Permission granted to share information::     Name::        Agency::     Relationship::     Contact Information:     Housing/Transportation Living arrangements for the past 2 months:  Single Family Home Source of Information:  Patient Patient Interpreter Needed:  None Criminal Activity/Legal Involvement Pertinent to Current Situation/Hospitalization:  No - Comment as needed Significant Relationships:  Adult Children Lives with:  Self Do you feel safe going back to the place where you live?  Yes Need for family participation in patient care:  Yes (Comment)  Care giving concerns:  Patient lives in [place] alone.    Social Worker assessment / plan:  Holiday representative ([place]) reviewed chart and noted that patient has a hip fracture. Per chart patient will have surgery today. PT is pending. CSW met with patient and her daughter Diane Rogers was at bedside. Patient was alert and oriented X3 and was laying in the bed. CSW introduced self and explained role of CSW department. Per patient she lives alone in [place] and has 4 adult children. Per patient her daughter Diane Rogers and son Diane Rogers are her HPOAs. CSW explained that after surgery PT will evaluate patient and make a recommendation of home health or SNF. CSW explained that [employer] will have to approve SNF. Patient reported that she prefers to go home with home health. Per patient she has never been to SNF before. RN case manager aware of above. CSW will continue to follow and assist as needed.    Employment status:  Disabled (Comment on whether or not currently receiving Disability), Retired Nurse, adult PT Recommendations:  Not assessed at this  time Information / Referral to community resources:  [place] [facility](SNF vs. [place] )  Patient/Family's Response to care:  Patient prefers to D/C home.   Patient/Family's Understanding of and Emotional Response to Diagnosis, Current Treatment, and Prognosis:  Patient was very pleasant and thanked CSW for assistance.   Emotional Assessment Appearance:  Appears stated age Attitude/Demeanor/Rapport:    Affect (typically observed):  Accepting, Adaptable, Pleasant Orientation:  Oriented to Self, Oriented to Place, Oriented to  Time Alcohol / Substance use:  Not Applicable Psych involvement (Current and /or in the community):  No (Comment)  Discharge Needs  Concerns to be addressed:  Discharge Planning Concerns Readmission within the last 30 days:  No Current discharge risk:  Dependent with Mobility Barriers to Discharge:  Continued Medical Work up   [employer], [name], LCSW 03/03/2018, 12:03 PM

## 2018-03-03 NOTE — Transfer of Care (Signed)
Immediate Anesthesia Transfer of Care Note  Patient: Diane Rogers  Procedure(s) Performed: ARTHROPLASTY BIPOLAR HIP (HEMIARTHROPLASTY)-LEFT (Left Hip)  Patient Location: PACU  Anesthesia Type:General  Level of Consciousness: sedated  Airway & Oxygen Therapy: Patient Spontanous Breathing and Patient connected to face mask oxygen  Post-op Assessment: Report given to RN and Post -op Vital signs reviewed and stable  Post vital signs: Reviewed and stable  Last Vitals:  Vitals Value Taken Time  BP 109/56 03/03/2018  3:35 PM  Temp 36.4 C 03/03/2018  3:35 PM  Pulse 78 03/03/2018  3:40 PM  Resp 15 03/03/2018  3:40 PM  SpO2 98 % 03/03/2018  3:40 PM  Vitals shown include unvalidated device data.  Last Pain:  Vitals:   03/03/18 1215  TempSrc: Temporal  PainSc:          Complications: No apparent anesthesia complications

## 2018-03-03 NOTE — Anesthesia Preprocedure Evaluation (Addendum)
Anesthesia Evaluation  Patient identified by MRN, date of birth, ID band Patient awake    Reviewed: Allergy & Precautions, NPO status , Patient's Chart, lab work & pertinent test results  History of Anesthesia Complications Negative for: history of anesthetic complications  Airway Mallampati: II  TM Distance: >3 FB Neck ROM: Full    Dental  (+) Upper Dentures, Missing,    Pulmonary neg pulmonary ROS, neg sleep apnea, neg COPD,    breath sounds clear to auscultation- rhonchi (-) wheezing      Cardiovascular hypertension, Pt. on medications (-) CAD, (-) Past MI, (-) Cardiac Stents and (-) CABG  Rhythm:Regular Rate:Normal - Systolic murmurs and - Diastolic murmurs    Neuro/Psych neg Seizures PSYCHIATRIC DISORDERS Depression negative neurological ROS     GI/Hepatic negative GI ROS, Neg liver ROS,   Endo/Other  negative endocrine ROSneg diabetes  Renal/GU negative Renal ROS     Musculoskeletal L hip fracture    Abdominal (+) - obese,   Peds  Hematology negative hematology ROS (+)   Anesthesia Other Findings Past Medical History: No date: Depression No date: Glucose intolerance (impaired glucose tolerance) No date: Hyperlipidemia No date: Hypertension No date: Ovarian failure   Reproductive/Obstetrics                             Lab Results  Component Value Date   WBC 12.8 (H) 03/03/2018   HGB 13.0 03/03/2018   HCT 41.0 03/03/2018   MCV 91.1 03/03/2018   PLT 216 03/03/2018    Anesthesia Physical Anesthesia Plan  ASA: II  Anesthesia Plan: Spinal   Post-op Pain Management:    Induction:   PONV Risk Score and Plan: 2 and Propofol infusion  Airway Management Planned: Natural Airway  Additional Equipment:   Intra-op Plan:   Post-operative Plan:   Informed Consent: I have reviewed the patients History and Physical, chart, labs and discussed the procedure including the  risks, benefits and alternatives for the proposed anesthesia with the patient or authorized representative who has indicated his/her understanding and acceptance.     Dental advisory given  Plan Discussed with: CRNA and Anesthesiologist  Anesthesia Plan Comments:         Anesthesia Quick Evaluation

## 2018-03-03 NOTE — H&P (Signed)
THE PATIENT WAS SEEN PRIOR TO SURGERY TODAY.  HISTORY, ALLERGIES, HOME MEDICATIONS AND OPERATIVE PROCEDURE WERE REVIEWED. RISKS AND BENEFITS OF SURGERY DISCUSSED WITH PATIENT AGAIN.  NO CHANGES FROM INITIAL HISTORY AND PHYSICAL NOTED.    

## 2018-03-03 NOTE — Op Note (Signed)
03/03/2018  3:38 PM  PATIENT:  Diane Rogers   MRN: [id number]  PRE-OPERATIVE DIAGNOSIS:  Displaced Subcapital fracture Left hip   POST-OPERATIVE DIAGNOSIS: Same  PROCEDURE:  Left   hip hemiarthroplasty with Stryker Accolade prosthesis  PREOPERATIVE INDICATIONS:  Diane Rogers is an 85 y.o. female who was admitted 03/02/2018 with a diagnosis of displaced subcapital fracture of the hip and elected for surgical management.  The risks benefits and alternatives were discussed with the patient including but not limited to the risks of nonoperative treatment, versus surgical intervention including infection, bleeding, nerve injury, periprosthetic fracture, the need for revision surgery, dislocation, leg length discrepancy, blood clots, cardiopulmonary complications, morbidity, mortality, among others, and they were willing to proceed.  Predicted outcome is good, although there will be at least a six to nine month expected recovery.     SURGEON:  Earnestine Leys, MD  ASST: Ave Filter    ANESTHESIA: Spinal    COMPLICATIONS:  None.   EBL:  100 cc    COMPONENTS:  Stryker Accolade Femoral Fracture stem size # 3  ,   and a size   44 mm  fracture head unipolar hip ball with    +4 mm  neck length.    PROCEDURE IN DETAIL: The patient was met in the holding area and identified.  The appropriate hip  was marked at the operative site. The patient was then transported to the OR and  placed under general anesthesia.  At that point, the patient was  placed in the lateral decubitus position with the operative side up and  secured to the operating room table and all bony prominences padded.     The operative lower extremity was prepped from the iliac crest to the toes.  Sterile draping was performed.  Time out was performed prior to incision.      A routine posterolateral approach was utilized via sharp dissection  carried down to the subcutaneous tissue.  Gross bleeders were Bovie  coagulated.   The iliotibial band was identified and incised  along the length of the skin incision.  Self-retaining retractors were  inserted.  With the hip internally rotated, the short external rotators  were identified. The piriformis was tagged and the hip capsule released in a T-type fashion.  The femoral neck was exposed, and I resected the femoral neck using the appropriate jig. This was performed at approximately a thumb's breadth above the lesser trochanter.    I then exposed the deep acetabulum, cleared out any tissue including the ligamentum teres.    I then prepared the proximal femur using the cookie-cutter, the lateralizing reamer, and then sequentially broached.  A trial stem   was  utilized along with a unipolar head and neck.  I reduced the hip and it was found to have excellent stability with functional range of motion. Leg lengths were equal.  The trial components were then removed.   The same size Accolade femoral stem was then inserted and was very stable.  The Unitrax head and neck as trialed were inserted as well.     The hip was then reduced and taken through functional range of motion and found to have excellent stability. Leg lengths were restored.     I closed the T in the capsule with #2 Ticron as well as the short external rotators. A hemovac was inserted.    I then irrigated the hip copiously again with pulse lavage, and repaired the fascia with #2 Brion Aliment and  the subcutaneous layer with #0 Quill. Sponge and needle counts were correct. Dry sterile Aquacell was applied.   The patient was then awakened and returned to PACU in stable and satisfactory condition. There were no complications.  Park Breed, MD Orthopedic Surgeon [phone]   03/03/2018 3:38 PM

## 2018-03-03 NOTE — Progress Notes (Signed)
[facility] - [place] at [facility]   PATIENT NAME: Diane Rogers    MR#:  [id number]  DATE OF BIRTH:  July 31, 1928  SUBJECTIVE:  CHIEF COMPLAINT:   Chief Complaint  Patient presents with  . Fall   No new complaint this morning.  Patient resting comfortably.  Scheduled for surgery today.  Updated family present at bedside.  REVIEW OF SYSTEMS:  Review of Systems  Constitutional: Negative for chills and fever.  HENT: Negative for hearing loss and tinnitus.   Eyes: Negative for blurred vision, double vision and photophobia.  Respiratory: Negative for cough, hemoptysis, sputum production and shortness of breath.   Cardiovascular: Negative for chest pain, palpitations and orthopnea.  Gastrointestinal: Negative for abdominal pain, heartburn, nausea and vomiting.  Genitourinary: Negative for dysuria and frequency.  Musculoskeletal: Negative for myalgias.       Fall with left hip fracture prior to admission  Skin: Negative for itching and rash.  Neurological: Negative for dizziness and headaches. Loss of consciousness:    Psychiatric/Behavioral: Negative for depression and hallucinations.      DRUG ALLERGIES:   Allergies  Allergen Reactions  . Penicillins    VITALS:  Blood pressure (!) 153/67, pulse 83, temperature 97.9 F (36.6 C), temperature source Temporal, resp. rate 20, height 5\' 1"  (1.549 m), weight 63.5 kg, SpO2 95 %. PHYSICAL EXAMINATION:  Physical Exam  Constitutional: She is oriented to person, place, and time and well-developed, well-nourished, and in no distress.  HENT:  Head: Normocephalic.  Eyes: Pupils are equal, round, and reactive to light. Conjunctivae and EOM are normal.  Neck: Normal range of motion. Neck supple. No tracheal deviation present.  Cardiovascular: Normal rate, regular rhythm and normal heart sounds.  Pulmonary/Chest: Effort normal and breath sounds normal.  Abdominal: Soft. Bowel sounds are normal. She exhibits no  distension. There is no abdominal tenderness.  Musculoskeletal:        General: No edema.     Comments: Left lower extremity connected to Buck's traction.  Status post left hip fracture  Neurological: She is alert and oriented to person, place, and time.  Skin: Skin is warm and dry. She is not diaphoretic.  Psychiatric: Affect and judgment normal.   LABORATORY PANEL:  Female CBC Recent Labs  Lab 03/03/18 0505  WBC 12.8*  HGB 13.0  HCT 41.0  PLT 216   ------------------------------------------------------------------------------------------------------------------ Chemistries  Recent Labs  Lab 03/02/18 1552  NA 139  K 3.7  CL 106  CO2 28  GLUCOSE 121*  BUN 17  CREATININE 0.93  CALCIUM 8.8*   RADIOLOGY:  No results found. ASSESSMENT AND PLAN:   1. Acute left hip fracture Status post mechanical fall Patient is very functional at baseline.  Patient is at least 4 METS. Denies any chest pain.  No shortness of breath. No contraindication to proceeding with planned surgery later today. Orthopedic physician planning on surgery later today  2.Acute left ankle pain X-ray suspicious for left avulsion fracture of the malleolus No pain protocol, orthopedic surgery following  3.  Hypertensive urgency accelerated hypertension Exacerbated by pain. Continue home regimen.  PRN IV hydralazine.  Monitor and adjust dose of meds as needed  4.History of hyperlipidemia LDL of 98.  Lifestyle modifications  DVT prophylaxis; patient on subacute heparin   All the records are reviewed and case discussed with Care Management/Social Worker. Management plans discussed with the patient, family and they are in agreement.  CODE STATUS: Full Code  TOTAL TIME TAKING CARE OF THIS  PATIENT: 34 minutes.   More than 50% of the time was spent in counseling/coordination of care: YES  POSSIBLE D/C IN 2 DAYS, DEPENDING ON CLINICAL CONDITION.   Ananya Mccleese M.D on 03/03/2018 at 2:24 PM  Between  7am to 6pm - Pager - [phone]  After 6pm go to [email or web address] - Social research officer, government  [facility] [place] [facility]  Office  [phone]  CC: Primary care physician; [facility], [place]  Note: This dictation was prepared with Dragon dictation along with smaller phrase technology. Any transcriptional errors that result from this process are unintentional.

## 2018-03-03 NOTE — Anesthesia Postprocedure Evaluation (Signed)
Anesthesia Post Note  Patient: Diane Rogers  Procedure(s) Performed: ARTHROPLASTY BIPOLAR HIP (HEMIARTHROPLASTY)-LEFT (Left Hip)  Patient location during evaluation: PACU Anesthesia Type: Combined General/Spinal Level of consciousness: awake and alert Pain management: pain level controlled Vital Signs Assessment: post-procedure vital signs reviewed and stable Respiratory status: spontaneous breathing, nonlabored ventilation, respiratory function stable and patient connected to nasal cannula oxygen Cardiovascular status: blood pressure returned to baseline and stable Postop Assessment: no apparent nausea or vomiting Anesthetic complications: no     Last Vitals:  Vitals:   03/03/18 1748 03/03/18 1853  BP: (!) 152/61 139/75  Pulse: 75 80  Resp: 18 17  Temp: 36.9 C 37.1 C  SpO2: 93% 94%    Last Pain:  Vitals:   03/03/18 1853  TempSrc: Oral  PainSc:                  Loyed Wilmes S

## 2018-03-03 NOTE — Progress Notes (Addendum)
Patient back from surgery. No c/o pain. VS taken and IV fluids started. Daughter Bonita Quin is bedside and she is very attentive to her mother.

## 2018-03-03 NOTE — NC FL2 (Addendum)
[place] MEDICAID FL2 LEVEL OF CARE SCREENING TOOL     IDENTIFICATION  Patient Name: Diane Rogers Birthdate: 1928-11-19 Sex: female Admission Date (Current Location): 03/02/2018  [place] and [place] Number:  Chiropodist and Address:  [facility], [address], [place], [place] [address]      Provider Number: [id number]  Attending Physician Name and Address:  Jama Flavors, MD  Relative Name and Phone Number:       Current Level of Care: Hospital Recommended Level of Care: Skilled Nursing Facility Prior Approval Number:    Date Approved/Denied:   PASRR Number: [id number])  Discharge Plan: SNF    Current Diagnoses: Patient Active Problem List   Diagnosis Date Noted  . Hip fracture (HCC) 03/02/2018  . Lower extremity edema 07/21/2015  . Depression, endogenous (HCC) 08/01/2014  . HLD (hyperlipidemia) 08/01/2014  . Benign essential HTN 09/12/2006    Orientation RESPIRATION BLADDER Height & Weight     Self, Time, Situation, Place  Normal Continent Weight: 140 lb (63.5 kg) Height:  5\' 1"  (154.9 cm)  BEHAVIORAL SYMPTOMS/MOOD NEUROLOGICAL BOWEL NUTRITION STATUS      Incontinent Diet(Diet: NPO for surgery to be advanced. )  AMBULATORY STATUS COMMUNICATION OF NEEDS Skin   Extensive Assist Verbally Surgical wounds                       Personal Care Assistance Level of Assistance  Bathing, Feeding, Dressing Bathing Assistance: Limited assistance Feeding assistance: Independent Dressing Assistance: Limited assistance     Functional Limitations Info  Sight, Hearing, Speech Sight Info: Adequate Hearing Info: Adequate Speech Info: Adequate    SPECIAL CARE FACTORS FREQUENCY  PT (By licensed PT), OT (By licensed OT)     PT Frequency: (5) OT Frequency: (5)            Contractures      Additional Factors Info  Code Status, Allergies Code Status Info: (Full Code. ) Allergies Info: (Penicillins)            Current Medications (03/03/2018):  This is the current hospital active medication list Current Facility-Administered Medications  Medication Dose Route Frequency Provider Last Rate Last Dose  . 0.9 %  sodium chloride infusion   Intravenous Continuous Deeann Saint, MD 75 mL/hr at 03/03/18 [phone]    . [MAR Hold] acetaminophen (TYLENOL) tablet 650 mg  650 mg Oral Q6H PRN Salary, Montell D, MD       Or  . Mitzi Hansen Hold] acetaminophen (TYLENOL) suppository 650 mg  650 mg Rectal Q6H PRN Salary, Montell D, MD      . Mitzi Hansen Hold] amLODipine (NORVASC) tablet 10 mg  10 mg Oral Daily Salary, Montell D, MD   10 mg at 03/03/18 0853   And  . [MAR Hold] benazepril (LOTENSIN) tablet 20 mg  20 mg Oral Daily Salary, Montell D, MD   20 mg at 03/02/18 2145  . [MAR Hold] ceFAZolin (ANCEF) IVPB 2g/100 mL premix  2 g Intravenous On Call to OR Deeann Saint, MD      . Mitzi Hansen Hold] Chlorhexidine Gluconate Cloth 2 % PADS 6 each  6 each Topical Q0600 Salary, Evelena Asa, MD   6 each at 03/03/18 0525  . [MAR Hold] clindamycin (CLEOCIN) IVPB 600 mg  600 mg Intravenous On Call to OR Deeann Saint, MD      . Mitzi Hansen Hold] docusate sodium (COLACE) capsule 100 mg  100 mg Oral BID Salary, Evelena Asa, MD  100 mg at 03/03/18 0853  . [MAR Hold] heparin injection 5,000 Units  5,000 Units Subcutaneous Q8H Salary, Montell D, MD   5,000 Units at 03/03/18 0520  . [MAR Hold] hydrALAZINE (APRESOLINE) injection 10 mg  10 mg Intravenous Q4H PRN Salary, Evelena AsaMontell D, MD      . Mitzi Hansen[MAR Hold] HYDROcodone-acetaminophen (NORCO/VICODIN) 5-325 MG per tablet 1 tablet  1 tablet Oral Q4H PRN Deeann SaintMiller, Howard, MD   1 tablet at 03/02/18 2006  . [MAR Hold] morphine 2 MG/ML injection 1 mg  1 mg Intravenous Q2H PRN Deeann SaintMiller, Howard, MD      . Mitzi Hansen[MAR Hold] mupirocin ointment (BACTROBAN) 2 % 1 application  1 application Nasal BID Salary, Evelena AsaMontell D, MD   1 application at 03/02/18 2300  . [MAR Hold] ondansetron (ZOFRAN) tablet 4 mg  4 mg Oral Q6H PRN Salary, Montell D, MD        Or  . Mitzi Hansen[MAR Hold] ondansetron (ZOFRAN) injection 4 mg  4 mg Intravenous Q6H PRN Salary, Montell D, MD      . Mitzi Hansen[MAR Hold] polyethylene glycol (MIRALAX / GLYCOLAX) packet 17 g  17 g Oral Daily PRN Salary, Evelena AsaMontell D, MD      . Mitzi Hansen[MAR Hold] Vitamin D (Ergocalciferol) (DRISDOL) capsule 50,000 Units  50,000 Units Oral Q7 days Bertrum SolSalary, Montell D, MD   50,000 Units at 03/03/18 [id number]     Discharge Medications: Please see discharge summary for a list of discharge medications.  Relevant Imaging Results:  Relevant Lab Results:   Additional Information (SSN: [id number])  [place], Darleen CrockerBailey M, LCSW

## 2018-03-03 NOTE — Anesthesia Post-op Follow-up Note (Signed)
Anesthesia QCDR form completed.        

## 2018-03-04 LAB — BASIC METABOLIC PANEL
Anion gap: 5 (ref 5–15)
BUN: 16 mg/dL (ref 8–23)
CHLORIDE: 108 mmol/L (ref 98–111)
CO2: 24 mmol/L (ref 22–32)
Calcium: 7.9 mg/dL — ABNORMAL LOW (ref 8.9–10.3)
Creatinine, Ser: 1.07 mg/dL — ABNORMAL HIGH (ref 0.44–1.00)
GFR calc Af Amer: 54 mL/min — ABNORMAL LOW (ref >60)
GFR calc non Af Amer: 47 mL/min — ABNORMAL LOW (ref >60)
Glucose, Bld: 151 mg/dL — ABNORMAL HIGH (ref 70–99)
POTASSIUM: 4.1 mmol/L (ref 3.5–5.1)
SODIUM: 137 mmol/L (ref 135–145)

## 2018-03-04 LAB — CBC
HCT: 36.2 % (ref 36.0–46.0)
HEMOGLOBIN: 11.7 g/dL — AB (ref 12.0–15.0)
MCH: 29.7 pg (ref 26.0–34.0)
MCHC: 32.3 g/dL (ref 30.0–36.0)
MCV: 91.9 fL (ref 80.0–100.0)
Platelets: 215 10*3/uL (ref 150–400)
RBC: 3.94 MIL/uL (ref 3.87–5.11)
RDW: 13.4 % (ref 11.5–15.5)
WBC: 14.7 10*3/uL — ABNORMAL HIGH (ref 4.0–10.5)
nRBC: 0 % (ref 0.0–0.2)

## 2018-03-04 LAB — MAGNESIUM: Magnesium: 2 mg/dL (ref 1.7–2.4)

## 2018-03-04 NOTE — Progress Notes (Signed)
Physical Therapy Treatment Patient Details Name: Diane Rogers MRN: [id number] DOB: Mar 20, 1928 Today's Date: 03/04/2018    History of Present Illness Pt is a 85 y/o F s/p fall and now with L hemiarthroplasty.      PT Comments    Diane Rogers made modest progress toward mobility goals with improved step length when performing stand pivot transfer.  Pt continues to require step by step cues for safety and technique with all aspects of mobility.  Pt required max assist for sit>stand from chair and mod assist for sit>stand from bed.  Follow up recommendations remain appropriate.    Follow Up Recommendations  SNF     Equipment Recommendations  Rolling walker with 5" wheels;3in1 (PT)    Recommendations for Other Services       Precautions / Restrictions Precautions Precautions: Fall;Posterior Hip Precaution Comments: Pt unable to recall posterior hip precautions, these were reviewed with the pt at the start of the session.  Restrictions Weight Bearing Restrictions: Yes LLE Weight Bearing: Partial weight bearing LLE Partial Weight Bearing Percentage or Pounds: 50    Mobility  Bed Mobility               General bed mobility comments: Pt sitting in chair at start and end of session  Transfers Overall transfer level: Needs assistance Equipment used: Rolling walker (2 wheeled) Transfers: Sit to/from [employer] Transfers Sit to Stand: Max assist;Mod assist Stand pivot transfers: Mod assist       General transfer comment: Cues for proper hand placement and positioning of LLE.  Max assist to boost from chair at start of session, mod assist to boost from bed.  Improved step length Bil with stand pivot transfer this afternoon.  Pt following commands consistently with only occasional repeition needed.  Pt continues to demonstrate increased instability when backing up.    Ambulation/Gait             General Gait Details: Unable to attempt at this  time   Stairs             Wheelchair Mobility    Modified Rankin (Stroke Patients Only)       Balance Overall balance assessment: Needs assistance Sitting-balance support: Feet supported;Single extremity supported Sitting balance-[name]: Poor Sitting balance - Comments: Pt relies on at least 1UE support for static sitting EOB   Standing balance support: Bilateral upper extremity supported;During functional activity Standing balance-[name]: Poor Standing balance comment: Pt relies on BUE support for static and dynamic activities                            Cognition Arousal/Alertness: Awake/alert Behavior During Therapy: WFL for tasks assessed/performed                                   General Comments: Pt able to follow cues more easily this afternoon but the following remains true: Pt requires simple step by step cues for all aspects of mobility.  She requires cues to remain focused on functional task.  Pt HOH.  Daughter reports she has not noticed any new confusion from baseline.       Exercises General Exercises - Lower Extremity Ankle Circles/Pumps: AROM;Both;10 reps;Seated Long Arc Quad: AROM;Left;10 reps;Seated    General Comments General comments (skin integrity, edema, etc.): Two daughters present during session      Pertinent Vitals/Pain  Pain Assessment: Faces Faces Pain Scale: Hurts even more Pain Location: LLE with mobility Pain Descriptors / Indicators: Aching;Grimacing;Guarding;Moaning Pain Intervention(s): Limited activity within patient's tolerance;Monitored during session;Utilized relaxation techniques    Home Living                      Prior Function            PT Goals (current goals can now be found in the care plan section) Acute Rehab PT Goals Patient Stated Goal: to improve mobility PT Goal Formulation: With patient Time For Goal Achievement: 03/18/18 Potential to Achieve Goals:  Good Progress towards PT goals: Progressing toward goals    Frequency    BID      PT Plan Current plan remains appropriate    Co-evaluation              AM-PAC PT "6 Clicks" Mobility   Outcome Measure  Help needed turning from your back to your side while in a flat bed without using bedrails?: A Lot Help needed moving from lying on your back to sitting on the side of a flat bed without using bedrails?: A Lot Help needed moving to and from a bed to a chair (including a wheelchair)?: A Lot Help needed standing up from a chair using your arms (e.g., wheelchair or bedside chair)?: A Lot Help needed to walk in hospital room?: A Lot Help needed climbing 3-5 steps with a railing? : Total 6 Click Score: 11    End of Session Equipment Utilized During Treatment: Gait belt Activity Tolerance: Patient limited by fatigue Patient left: in chair;with call bell/phone within reach;with chair alarm set;with SCD's reapplied;with family/visitor present(only one SCD in room, RN already aware) Nurse Communication: Mobility status;Other (comment)(only one SCD in room, RN already aware, due to bucks tractio) PT Visit Diagnosis: Pain;Unsteadiness on feet (R26.81);Other abnormalities of gait and mobility (R26.89);Muscle weakness (generalized) (M62.81) Pain - Right/Left: Left Pain - part of body: Hip     Time: [id number] PT Time Calculation (min) (ACUTE ONLY): 27 min  Charges:  $Therapeutic Activity: 23-37 mins                     Encarnacion ChuAshley Abashian PT, DPT 03/04/2018, 2:18 PM

## 2018-03-04 NOTE — Progress Notes (Signed)
[facility] - [place] [facility]   PATIENT NAME: Davina Pokeleanor Ohlin    MR#:  [id number]  DATE OF BIRTH:  09/23/1928  SUBJECTIVE:  CHIEF COMPLAINT:   Chief Complaint  Patient presents with  . Fall   No new complaint this morning.  Patient resting comfortably.  S/p surgery 03/03/18  REVIEW OF SYSTEMS:  Review of Systems  Constitutional: Negative for chills and fever.  HENT: Negative for hearing loss and tinnitus.   Eyes: Negative for blurred vision, double vision and photophobia.  Respiratory: Negative for cough, hemoptysis, sputum production and shortness of breath.   Cardiovascular: Negative for chest pain, palpitations and orthopnea.  Gastrointestinal: Negative for abdominal pain, heartburn, nausea and vomiting.  Genitourinary: Negative for dysuria and frequency.  Musculoskeletal: Negative for myalgias.       Fall with left hip fracture prior to admission  Skin: Negative for itching and rash.  Neurological: Negative for dizziness and headaches. Loss of consciousness:    Psychiatric/Behavioral: Negative for depression and hallucinations.      DRUG ALLERGIES:   Allergies  Allergen Reactions  . Penicillins    VITALS:  Blood pressure (!) 143/61, pulse 69, temperature 98.7 F (37.1 C), temperature source Oral, resp. rate 19, height 5\' 1"  (1.549 m), weight 63.5 kg, SpO2 94 %. PHYSICAL EXAMINATION:  Physical Exam  Constitutional: She is oriented to person, place, and time and well-developed, well-nourished, and in no distress.  HENT:  Head: Normocephalic.  Eyes: Pupils are equal, round, and reactive to light. Conjunctivae and EOM are normal.  Neck: Normal range of motion. Neck supple. No tracheal deviation present.  Cardiovascular: Normal rate, regular rhythm and normal heart sounds.  Pulmonary/Chest: Effort normal and breath sounds normal.  Abdominal: Soft. Bowel sounds are normal. She exhibits no distension. There is no abdominal tenderness.    Musculoskeletal:        General: No edema.     Comments: Left lower extremity connected to Buck's traction.  Status post left hip surgery  Neurological: She is alert and oriented to person, place, and time.  Skin: Skin is warm and dry. She is not diaphoretic.  Psychiatric: Affect and judgment normal.   LABORATORY PANEL:  Female CBC Recent Labs  Lab 03/04/18 0431  WBC 14.7*  HGB 11.7*  HCT 36.2  PLT 215   ------------------------------------------------------------------------------------------------------------------ Chemistries  Recent Labs  Lab 03/04/18 0431  NA 137  K 4.1  CL 108  CO2 24  GLUCOSE 151*  BUN 16  CREATININE 1.07*  CALCIUM 7.9*  MG 2.0   RADIOLOGY:  Dg Hip Port Unilat With Pelvis 1v Left  Result Date: 03/03/2018 CLINICAL DATA:  Status post left hip prosthesis.  Hip fracture. EXAM: DG HIP (WITH OR WITHOUT PELVIS) 1V PORT LEFT COMPARISON:  Radiographs 03/02/2018 FINDINGS: There is a bipolar hip prosthesis which appears well seated without complicating features. The right hip is normally located. The visualized bony pelvis is intact. IMPRESSION: Bipolar left hip prosthesis in good position without complicating features. Electronically Signed   By: Rudie MeyerP.  Gallerani M.D.   On: 03/03/2018 16:31   ASSESSMENT AND PLAN:   1. Acute left hip fracture Status post mechanical fall Patient is very functional at baseline.  Status post surgery 03/03/18  2.Acute left ankle pain X-ray suspicious for left avulsion fracture of the malleolus No pain protocol, orthopedic surgery following Partial weightbearing on left lower limb  3.  Hypertensive urgency accelerated hypertension Exacerbated by pain. Continue home regimen.  PRN IV hydralazine.  Monitor and adjust dose of meds as needed  4.History of hyperlipidemia LDL of 98.  Lifestyle modifications  DVT prophylaxis; patient on subacute heparin   All the records are reviewed and case discussed with Care  Management/Social Worker. Management plans discussed with the patient, family and they are in agreement.  CODE STATUS: Full Code  TOTAL TIME TAKING CARE OF THIS PATIENT: 34 minutes.   More than 50% of the time was spent in counseling/coordination of care: YES  POSSIBLE D/C IN 2 DAYS, DEPENDING ON CLINICAL CONDITION. Patient's daughter was present in the room during my visit.  Altamese Dilling M.D on 03/04/2018 at 2:21 PM  Between 7am to 6pm - Pager - [phone]  After 6pm go to [email or web address] - Social research officer, government  [facility] [place]  Office  [phone]  CC: Primary care physician; [facility], [place]  Note: This dictation was prepared with Dragon dictation along with smaller phrase technology. Any transcriptional errors that result from this process are unintentional.

## 2018-03-04 NOTE — Evaluation (Signed)
Physical Therapy Evaluation Patient Details Name: Diane Rogers MRN: [id number] DOB: [birth year] Today's Date: 03/04/2018   History of Present Illness  Pti s a 85 y/o F s/p fall and now with L hemiarthroplasty.    Clinical Impression  Patient is s/p above surgery resulting in functional limitations due to the deficits listed below (see PT Problem List). Diane Rogers requires heavy mod assist for bed mobility, and mod assist for sit<>stand and stand pivot transfers with simple step by step cues for all aspects of mobility.  Given pt's current mobility status, recommending SNF at d/c.  Patient will benefit from skilled PT to increase their independence and safety with mobility to allow discharge to the venue listed below.      Follow Up Recommendations SNF    Equipment Recommendations  Rolling walker with 5" wheels;3in1 (PT)    Recommendations for Other Services       Precautions / Restrictions Precautions Precautions: Fall;Posterior Hip Precaution Booklet Issued: Yes (comment) Precaution Comments: Instructed pt in posterior hip precautions and provided pt with handout.  No hip precautions indicated in orders but Dr. Hyacinth Meeker verbally confirmed posterior hip precautions on 1/25.   Restrictions Weight Bearing Restrictions: Yes LLE Weight Bearing: Partial weight bearing LLE Partial Weight Bearing Percentage or Pounds: 50      Mobility  Bed Mobility Overal bed mobility: Needs Assistance Bed Mobility: Supine to Sit     Supine to sit: Mod assist;HOB elevated     General bed mobility comments: Pt requires heavy mod assist to advance LEs to EOB and to elevate trunk.  Step by step cues for sequencing.  Pt uses bed rail.   Transfers Overall transfer level: Needs assistance Equipment used: Rolling walker (2 wheeled) Transfers: Sit to/from [employer] Sit to Stand: Mod assist Stand pivot transfers: Mod assist       General transfer comment: Cues for proper hand  placement.  Assist to boost to standing.  Assist for controlled descent to sit. To pivot pt requires simplified step by step cues.  Assist for RW management and to remain steady.  Pt more unsteady when backing up to sit down.   Ambulation/Gait             General Gait Details: Unable to attempt at this time  Stairs            Wheelchair Mobility    Modified Rankin (Stroke Patients Only)       Balance Overall balance assessment: Needs assistance Sitting-balance support: Feet supported;Single extremity supported Sitting balance-[name]: Poor Sitting balance - Comments: Pt relies on at least 1UE support for static sitting EOB   Standing balance support: Bilateral upper extremity supported;During functional activity Standing balance-[name]: Poor Standing balance comment: Pt relies on BUE support for static and dynamic activities                             Pertinent Vitals/Pain Pain Assessment: Faces Faces Pain Scale: Hurts even more Pain Location: LLE with mobility Pain Descriptors / Indicators: Aching;Grimacing;Guarding;Moaning Pain Intervention(s): Limited activity within patient's tolerance;Monitored during session;Repositioned;Premedicated before session;Utilized relaxation techniques    Home Living Family/patient expects to be discharged to:: Private residence Living Arrangements: Alone Available Help at Discharge: Family;Available PRN/intermittently Type of Home: House Home Access: Stairs to enter Entrance Stairs-Rails: (railing in the middle) Entrance Stairs-Number of Steps: 2 Home Layout: Two level Home Equipment: Cane - single point  Prior Function Level of Independence: Independent with assistive device(s)         Comments: Pt ambulating with SPC.  No falls in the past 6 months.  Does her own grocery shopping and driving.      Hand Dominance        Extremity/Trunk Assessment   Upper Extremity Assessment Upper  Extremity Assessment: Generalized weakness    Lower Extremity Assessment Lower Extremity Assessment: LLE deficits/detail LLE Deficits / Details: Unable to formally assess s/p hip surgery.  Pt requires assist with any movement of LLE.      Cervical / Trunk Assessment Cervical / Trunk Assessment: Kyphotic  Communication   Communication: HOH  Cognition Arousal/Alertness: Awake/alert Behavior During Therapy: WFL for tasks assessed/performed                                   General Comments: Pt requires simple step by step cues for all aspects of mobility.  She requires cues to remain focused on functional task.  Pt HOH.  Diane Rogers reports she has not noticed any new confusion from baseline.       General Comments General comments (skin integrity, edema, etc.): Diane Rogers present during evaluation    Exercises General Exercises - Lower Extremity Ankle Circles/Pumps: AROM;Both;10 reps;Supine   Assessment/Plan    PT Assessment Patient needs continued PT services  PT Problem List Decreased strength;Decreased range of motion;Decreased activity tolerance;Decreased balance;Decreased mobility;Decreased cognition;Decreased knowledge of use of DME;Decreased safety awareness;Pain;Decreased knowledge of precautions       PT Treatment Interventions DME instruction;Gait training;Stair training;Functional mobility training;Therapeutic activities;Therapeutic exercise;Balance training;Neuromuscular re-education;Patient/family education;Cognitive remediation;Modalities;Wheelchair mobility training    PT Goals (Current goals can be found in the Care Plan section)  Acute Rehab PT Goals Patient Stated Goal: to improve mobility PT Goal Formulation: With patient Time For Goal Achievement: 03/18/18 Potential to Achieve Goals: Good    Frequency BID   Barriers to discharge Inaccessible home environment;Decreased caregiver support Lives alone with steps to enter home    Co-evaluation                AM-PAC PT "6 Clicks" Mobility  Outcome Measure Help needed turning from your back to your side while in a flat bed without using bedrails?: A Lot Help needed moving from lying on your back to sitting on the side of a flat bed without using bedrails?: A Lot Help needed moving to and from a bed to a chair (including a wheelchair)?: A Lot Help needed standing up from a chair using your arms (e.g., wheelchair or bedside chair)?: A Lot Help needed to walk in hospital room?: A Lot Help needed climbing 3-5 steps with a railing? : Total 6 Click Score: 11    End of Session Equipment Utilized During Treatment: Gait belt Activity Tolerance: Patient limited by fatigue Patient left: in chair;with call bell/phone within reach;with chair alarm set;with SCD's reapplied;with family/visitor present(only one SCD in room, RN notified) Nurse Communication: Mobility status;Other (comment);Weight bearing status;Precautions(only one SCD in room) PT Visit Diagnosis: Pain;Unsteadiness on feet (R26.81);Other abnormalities of gait and mobility (R26.89);Muscle weakness (generalized) (M62.81) Pain - Right/Left: Left Pain - part of body: Hip    Time: 0865-7846 PT Time Calculation (min) (ACUTE ONLY): 43 min   Charges:   PT Evaluation $PT Eval Low Complexity: 1 Low PT Treatments $Therapeutic Activity: 23-37 mins        Encarnacion Chu PT, DPT 03/04/2018, 11:25 AM

## 2018-03-04 NOTE — Plan of Care (Signed)
Discussed with patient plan of care for the evening, pain management and her traction with some teach back displayed

## 2018-03-04 NOTE — Progress Notes (Signed)
Subjective:  Patient reports pain as mild.  Says pain is around left hip incision.  Denies pain at left ankle.  Objective:   VITALS:   Vitals:   03/03/18 1853 03/03/18 1936 03/03/18 2345 03/04/18 0755  BP: 139/75 (!) 150/63 (!) 156/71 (!) 143/61  Pulse: 80 76 76 69  Resp: 17 20 19    Temp: 98.8 F (37.1 C) 99 F (37.2 C) 98.5 F (36.9 C) 98.7 F (37.1 C)  TempSrc: Oral Oral Oral Oral  SpO2: 94% 94% 94% 94%  Weight:      Height:        PHYSICAL EXAM:  Neurologically intact ABD soft Neurovascular intact Sensation intact distally Intact pulses distally Dorsiflexion/Plantar flexion intact aqua cell in place  LABS  Results for orders placed or performed during the hospital encounter of 03/02/18 (from the past 24 hour(s))  CBC     Status: Abnormal   Collection Time: 03/03/18  5:33 PM  Result Value Ref Range   WBC 19.0 (H) 4.0 - 10.5 K/uL   RBC 4.50 3.87 - 5.11 MIL/uL   Hemoglobin 13.1 12.0 - 15.0 g/dL   HCT 16.141.6 09.636.0 - 04.546.0 %   MCV 92.4 80.0 - 100.0 fL   MCH 29.1 26.0 - 34.0 pg   MCHC 31.5 30.0 - 36.0 g/dL   RDW 40.913.5 81.111.5 - 91.415.5 %   Platelets 209 150 - 400 K/uL   nRBC 0.0 0.0 - 0.2 %  Creatinine, serum     Status: Abnormal   Collection Time: 03/03/18  5:33 PM  Result Value Ref Range   Creatinine, Ser 0.87 0.44 - 1.00 mg/dL   GFR calc non Af Amer 60 (L) >60 mL/min   GFR calc Af Amer >60 >60 mL/min  Basic metabolic panel     Status: Abnormal   Collection Time: 03/04/18  4:31 AM  Result Value Ref Range   Sodium 137 135 - 145 mmol/L   Potassium 4.1 3.5 - 5.1 mmol/L   Chloride 108 98 - 111 mmol/L   CO2 24 22 - 32 mmol/L   Glucose, Bld 151 (H) 70 - 99 mg/dL   BUN 16 8 - 23 mg/dL   Creatinine, Ser 7.821.07 (H) 0.44 - 1.00 mg/dL   Calcium 7.9 (L) 8.9 - 10.3 mg/dL   GFR calc non Af Amer 47 (L) >60 mL/min   GFR calc Af Amer 54 (L) >60 mL/min   Anion gap 5 5 - 15  CBC     Status: Abnormal   Collection Time: 03/04/18  4:31 AM  Result Value Ref Range   WBC 14.7 (H)  4.0 - 10.5 K/uL   RBC 3.94 3.87 - 5.11 MIL/uL   Hemoglobin 11.7 (L) 12.0 - 15.0 g/dL   HCT 95.636.2 21.336.0 - 08.646.0 %   MCV 91.9 80.0 - 100.0 fL   MCH 29.7 26.0 - 34.0 pg   MCHC 32.3 30.0 - 36.0 g/dL   RDW 57.813.4 46.911.5 - 62.915.5 %   Platelets 215 150 - 400 K/uL   nRBC 0.0 0.0 - 0.2 %  Magnesium     Status: None   Collection Time: 03/04/18  4:31 AM  Result Value Ref Range   Magnesium 2.0 1.7 - 2.4 mg/dL    Dg Chest 1 View  Result Date: 03/02/2018 CLINICAL DATA:  The patient suffered a left hip fracture in a fall today. EXAM: CHEST  1 VIEW COMPARISON:  None. FINDINGS: The lungs are clear. Heart size is normal. Aortic atherosclerosis noted. No  pneumothorax or pleural fluid. No acute or focal bony abnormality. IMPRESSION: No acute disease.  Aortic atherosclerosis. Electronically Signed   By: Drusilla Kanner M.D.   On: 03/02/2018 14:09   Dg Ankle Complete Left  Result Date: 03/02/2018 CLINICAL DATA:  Fall. EXAM: LEFT ANKLE COMPLETE - 3+ VIEW COMPARISON:  03/02/2018. FINDINGS: Soft tissue swelling noted lateral malleolus. Diffuse degenerative change. Calcaneal spurring. Tiny bony density adjacent to the lateral malleolus can not be excluded. This could represent a subtle avulsion fracture, age undetermined. For vascular calcification. IMPRESSION: 1. Soft tissue swelling over the lateral malleolus. Tiny bony density adjacent to the lateral malleolus can not be excluded. This could represent subtle avulsion fracture, age undetermined. No acute abnormality otherwise noted. 2.  Peripheral vascular disease. Electronically Signed   By: Maisie Fus  Register   On: 03/02/2018 14:12   Dg Hip Port Unilat With Pelvis 1v Left  Result Date: 03/03/2018 CLINICAL DATA:  Status post left hip prosthesis.  Hip fracture. EXAM: DG HIP (WITH OR WITHOUT PELVIS) 1V PORT LEFT COMPARISON:  Radiographs 03/02/2018 FINDINGS: There is a bipolar hip prosthesis which appears well seated without complicating features. The right hip is normally  located. The visualized bony pelvis is intact. IMPRESSION: Bipolar left hip prosthesis in good position without complicating features. Electronically Signed   By: Rudie Meyer M.D.   On: 03/03/2018 16:31   Dg Hip Unilat With Pelvis 2-3 Views Left  Result Date: 03/02/2018 CLINICAL DATA:  85 year old female with hip pain after a fall EXAM: DG HIP (WITH OR WITHOUT PELVIS) 2-3V LEFT COMPARISON:  None. FINDINGS: Osteopenia. Bony pelvic ring intact with no pelvic fracture identified. Left-sided subcapital hip fracture with minimal displacement. Right hip maintains alignment with mild degenerative changes. IMPRESSION: Left subcapital hip fracture with mild displacement. Osteopenia. Electronically Signed   By: Gilmer Mor D.O.   On: 03/02/2018 14:07    Assessment/Plan: 1 Day Post-Op   Active Problems:   Hip fracture (HCC)   Discharge to home per medicine service, hopefully Monday Partial weightbearing 50% left lower extremity Follow up with Dr. Hyacinth Meeker in 10 days     Altamese Cabal , PA-C 03/04/2018, 11:24 AM

## 2018-03-05 LAB — URINALYSIS, COMPLETE (UACMP) WITH MICROSCOPIC
Bilirubin Urine: NEGATIVE
GLUCOSE, UA: NEGATIVE mg/dL
Hgb urine dipstick: NEGATIVE
Ketones, ur: NEGATIVE mg/dL
Leukocytes, UA: NEGATIVE
Nitrite: NEGATIVE
Protein, ur: NEGATIVE mg/dL
Specific Gravity, Urine: 1.004 — ABNORMAL LOW (ref 1.005–1.030)
pH: 6 (ref 5.0–8.0)

## 2018-03-05 LAB — CBC
HCT: 30.6 % — ABNORMAL LOW (ref 36.0–46.0)
Hemoglobin: 9.8 g/dL — ABNORMAL LOW (ref 12.0–15.0)
MCH: 29.4 pg (ref 26.0–34.0)
MCHC: 32 g/dL (ref 30.0–36.0)
MCV: 91.9 fL (ref 80.0–100.0)
Platelets: 177 10*3/uL (ref 150–400)
RBC: 3.33 MIL/uL — AB (ref 3.87–5.11)
RDW: 13.6 % (ref 11.5–15.5)
WBC: 11.2 10*3/uL — ABNORMAL HIGH (ref 4.0–10.5)
nRBC: 0 % (ref 0.0–0.2)

## 2018-03-05 LAB — BASIC METABOLIC PANEL
Anion gap: 5 (ref 5–15)
BUN: 23 mg/dL (ref 8–23)
CO2: 26 mmol/L (ref 22–32)
Calcium: 7.7 mg/dL — ABNORMAL LOW (ref 8.9–10.3)
Chloride: 102 mmol/L (ref 98–111)
Creatinine, Ser: 1.21 mg/dL — ABNORMAL HIGH (ref 0.44–1.00)
GFR calc Af Amer: 47 mL/min — ABNORMAL LOW (ref >60)
GFR, EST NON AFRICAN AMERICAN: 40 mL/min — AB (ref >60)
Glucose, Bld: 149 mg/dL — ABNORMAL HIGH (ref 70–99)
POTASSIUM: 3.6 mmol/L (ref 3.5–5.1)
Sodium: 133 mmol/L — ABNORMAL LOW (ref 135–145)

## 2018-03-05 MED ORDER — SODIUM CHLORIDE 0.9 % IV SOLN
INTRAVENOUS | Status: DC
  Administered 2018-03-05 – 2018-03-06 (×3): via INTRAVENOUS

## 2018-03-05 NOTE — Progress Notes (Signed)
Physical Therapy Treatment Patient Details Name: Diane Rogers MRN: [id number] DOB: [birth year] Today's Date: 03/05/2018    History of Present Illness Pt is a 85 y/o F s/p fall and now with L hemiarthroplasty.      PT Comments    [name] made modest progress toward mobility goals, demonstrating ability to ambulate short distance in room.  She did fatigue quickly and required min assist to remain steady while ambulating.  Pt requires max assist for bed mobility and up to max assist with sit>stand this date.  She continues to require simple step by step cues for all mobility.  Follow up recommendations remain appropriate.    Follow Up Recommendations  SNF     Equipment Recommendations  3in1 (PT);Other (comment)(youth RW)    Recommendations for Other Services       Precautions / Restrictions Precautions Precautions: Fall;Posterior Hip Precaution Comments: Pt able to recall 1/3 hip precautions, requires review at start of session.  Restrictions Weight Bearing Restrictions: Yes LLE Weight Bearing: Partial weight bearing LLE Partial Weight Bearing Percentage or Pounds: 50    Mobility  Bed Mobility Overal bed mobility: Needs Assistance Bed Mobility: Supine to Sit     Supine to sit: Max assist;HOB elevated     General bed mobility comments: Assist for all aspets of bed mobility.  Step by step cues. Especially mroe assist needed this session to elevate trunk due to posterior lean.   Transfers Overall transfer level: Needs assistance Equipment used: (youth RW) Transfers: Sit to/from [employer] Sit to Stand: Max assist;Mod assist Stand pivot transfers: Mod assist       General transfer comment: Cues for proper technique and to adhere to hip precautions.  Assist to boost to standing. Max assist from bed, mod assist from chair.   Ambulation/Gait Ambulation/Gait assistance: Min assist Gait Distance (Feet): 6 Feet Assistive device: (youth RW) Gait  Pattern/deviations: Step-to pattern;Decreased step length - right;Decreased stance time - left;Decreased weight shift to left;Antalgic;Trunk flexed     General Gait Details: Step by step cues and sequencing.  Assist to remain steady. Pt fatigues quickly.    Stairs             Wheelchair Mobility    Modified Rankin (Stroke Patients Only)       Balance Overall balance assessment: Needs assistance Sitting-balance support: Feet supported;Single extremity supported Sitting balance-[name]: Poor Sitting balance - Comments: Pt relies on at least BUE support for static sitting EOB   Standing balance support: Bilateral upper extremity supported;During functional activity Standing balance-[name]: Poor Standing balance comment: Pt relies on BUE support for static and dynamic activities                            Cognition Arousal/Alertness: Awake/alert Behavior During Therapy: WFL for tasks assessed/performed                                   General Comments: Pt continues to require simple step by step cues for mobility      Exercises      General Comments General comments (skin integrity, edema, etc.): Daughter present during session.       Pertinent Vitals/Pain Pain Assessment: Faces Faces Pain Scale: Hurts even more Pain Location: LLE with mobility Pain Descriptors / Indicators: Aching;Grimacing;Guarding;Moaning Pain Intervention(s): Limited activity within patient's tolerance;Monitored during session;Repositioned;Utilized relaxation techniques  Home Living                      Prior Function            PT Goals (current goals can now be found in the care plan section) Acute Rehab PT Goals Patient Stated Goal: to improve mobility PT Goal Formulation: With patient Time For Goal Achievement: 03/18/18 Potential to Achieve Goals: Good Progress towards PT goals: Progressing toward goals    Frequency     BID      PT Plan Equipment recommendations need to be updated    Co-evaluation              AM-PAC PT "6 Clicks" Mobility   Outcome Measure  Help needed turning from your back to your side while in a flat bed without using bedrails?: A Lot Help needed moving from lying on your back to sitting on the side of a flat bed without using bedrails?: A Lot Help needed moving to and from a bed to a chair (including a wheelchair)?: A Lot Help needed standing up from a chair using your arms (e.g., wheelchair or bedside chair)?: A Lot Help needed to walk in hospital room?: A Lot Help needed climbing 3-5 steps with a railing? : Total 6 Click Score: 11    End of Session Equipment Utilized During Treatment: Gait belt Activity Tolerance: Patient limited by fatigue Patient left: in chair;with call bell/phone within reach;with chair alarm set;with SCD's reapplied;with family/visitor present Nurse Communication: Mobility status PT Visit Diagnosis: Pain;Unsteadiness on feet (R26.81);Other abnormalities of gait and mobility (R26.89);Muscle weakness (generalized) (M62.81) Pain - Right/Left: Left Pain - part of body: Hip     Time: 8333-8329 PT Time Calculation (min) (ACUTE ONLY): 27 min  Charges:  $Therapeutic Activity: 23-37 mins                     Encarnacion Chu PT, DPT 03/05/2018, 10:40 AM

## 2018-03-05 NOTE — Progress Notes (Signed)
[facility] - [place] [facility]   PATIENT NAME: Diane Rogers    MR#:  [id number]  DATE OF BIRTH:  17-Mar-1928  SUBJECTIVE:  CHIEF COMPLAINT:   Chief Complaint  Patient presents with  . Fall   No new complaint this morning.  Patient resting comfortably.  S/p surgery 03/03/18 Yesterday she did not had much urine output after removal of Foley so catheter was placed again.  REVIEW OF SYSTEMS:  Review of Systems  Constitutional: Negative for chills and fever.  HENT: Negative for hearing loss and tinnitus.   Eyes: Negative for blurred vision, double vision and photophobia.  Respiratory: Negative for cough, hemoptysis, sputum production and shortness of breath.   Cardiovascular: Negative for chest pain, palpitations and orthopnea.  Gastrointestinal: Negative for abdominal pain, heartburn, nausea and vomiting.  Genitourinary: Negative for dysuria and frequency.  Musculoskeletal: Negative for myalgias.       Fall with left hip fracture prior to admission  Skin: Negative for itching and rash.  Neurological: Negative for dizziness and headaches. Loss of consciousness:    Psychiatric/Behavioral: Negative for depression and hallucinations.      DRUG ALLERGIES:   Allergies  Allergen Reactions  . Penicillins    VITALS:  Blood pressure (!) 132/57, pulse 94, temperature 98.8 F (37.1 C), temperature source Oral, resp. rate 18, height 5\' 1"  (1.549 m), weight 63.5 kg, SpO2 94 %. PHYSICAL EXAMINATION:  Physical Exam  Constitutional: She is oriented to person, place, and time and well-developed, well-nourished, and in no distress.  HENT:  Head: Normocephalic.  Eyes: Pupils are equal, round, and reactive to light. Conjunctivae and EOM are normal.  Neck: Normal range of motion. Neck supple. No tracheal deviation present.  Cardiovascular: Normal rate, regular rhythm and normal heart sounds.  Pulmonary/Chest: Effort normal and breath sounds normal.  Abdominal:  Soft. Bowel sounds are normal. She exhibits no distension. There is no abdominal tenderness.  Musculoskeletal:        General: No edema.     Comments: Left lower extremity connected to Buck's traction.  Status post left hip surgery  Neurological: She is alert and oriented to person, place, and time.  Skin: Skin is warm and dry. She is not diaphoretic.  Psychiatric: Affect and judgment normal.   LABORATORY PANEL:  Female CBC Recent Labs  Lab 03/05/18 0346  WBC 11.2*  HGB 9.8*  HCT 30.6*  PLT 177   ------------------------------------------------------------------------------------------------------------------ Chemistries  Recent Labs  Lab 03/04/18 0431 03/05/18 0346  NA 137 133*  K 4.1 3.6  CL 108 102  CO2 24 26  GLUCOSE 151* 149*  BUN 16 23  CREATININE 1.07* 1.21*  CALCIUM 7.9* 7.7*  MG 2.0  --    RADIOLOGY:  No results found. ASSESSMENT AND PLAN:   1. Acute left hip fracture Status post mechanical fall Patient is very functional at baseline.  Status post surgery 03/03/18- manage per ortho  2.Acute left ankle pain X-ray suspicious for left avulsion fracture of the malleolus No pain , orthopedic surgery following Partial weightbearing on left lower limb PT had suggested SNF placement but patient and daughter are hopeful for discharging home with therapy.  3.  Hypertensive urgency accelerated hypertension Exacerbated by pain. Continue home regimen.  PRN IV hydralazine.  Monitor and adjust dose of meds as needed  4.History of hyperlipidemia LDL of 98.  Lifestyle modifications  5.  Urinary retention UA is negative. Patient was also appearing slightly dehydrated so started on IV fluid and advised  to give a trial of catheter removal again today.  6.  Acute renal failure Likely dehydration, IV fluid and recheck tomorrow.  7.  Acute blood loss anemia Due to surgery, monitor tomorrow.  DVT prophylaxis; patient on subacute heparin   All the records are  reviewed and case discussed with Care Management/Social Worker. Management plans discussed with the patient, family and they are in agreement.  CODE STATUS: Full Code  TOTAL TIME TAKING CARE OF THIS PATIENT: 34 minutes.   More than 50% of the time was spent in counseling/coordination of care: YES  POSSIBLE D/C IN 1-2 DAYS, DEPENDING ON CLINICAL CONDITION. Patient's daughter was present in the room during my visit.  Altamese DillingVaibhavkumar Aleksa Collinsworth M.D on 03/05/2018 at 2:23 PM  Between 7am to 6pm - Pager - [phone]  After 6pm go to [email or web address] - Social research officer, governmentpassword [email or web address] [facility]  [facility]  Office  [phone]  CC: Primary care physician; [facility] [place]  Note: This dictation was prepared with Dragon dictation along with smaller phrase technology. Any transcriptional errors that result from this process are unintentional.

## 2018-03-05 NOTE — Progress Notes (Signed)
Subjective:  Patient reports pain as mild.  LLE,  still no pain at ankle.  Objective:   VITALS:   Vitals:   03/03/18 2345 03/04/18 0755 03/04/18 1610 03/04/18 2216  BP: (!) 156/71 (!) 143/61 (!) 135/55 (!) 126/54  Pulse: 76 69 (!) 59 66  Resp: 19   18  Temp: 98.5 F (36.9 C) 98.7 F (37.1 C) 98.3 F (36.8 C) 98 F (36.7 C)  TempSrc: Oral Oral Oral Axillary  SpO2: 94% 94% 98% 97%  Weight:      Height:        PHYSICAL EXAM:  Neurologically intact ABD soft Neurovascular intact Sensation intact distally Intact pulses distally Dorsiflexion/Plantar flexion intact  Aquacell intact  LABS  Results for orders placed or performed during the hospital encounter of 03/02/18 (from the past 24 hour(s))  Urinalysis, Complete w Microscopic     Status: Abnormal   Collection Time: 03/04/18 11:46 PM  Result Value Ref Range   Color, Urine STRAW (A) YELLOW   APPearance CLEAR (A) CLEAR   Specific Gravity, Urine 1.004 (L) 1.005 - 1.030   pH 6.0 5.0 - 8.0   Glucose, UA NEGATIVE NEGATIVE mg/dL   Hgb urine dipstick NEGATIVE NEGATIVE   Bilirubin Urine NEGATIVE NEGATIVE   Ketones, ur NEGATIVE NEGATIVE mg/dL   Protein, ur NEGATIVE NEGATIVE mg/dL   Nitrite NEGATIVE NEGATIVE   Leukocytes, UA NEGATIVE NEGATIVE   RBC / HPF 0-5 0 - 5 RBC/hpf   WBC, UA 0-5 0 - 5 WBC/hpf   Bacteria, UA RARE (A) NONE SEEN   Squamous Epithelial / LPF 0-5 0 - 5   Mucus PRESENT   CBC     Status: Abnormal   Collection Time: 03/05/18  3:46 AM  Result Value Ref Range   WBC 11.2 (H) 4.0 - 10.5 K/uL   RBC 3.33 (L) 3.87 - 5.11 MIL/uL   Hemoglobin 9.8 (L) 12.0 - 15.0 g/dL   HCT 16.130.6 (L) 09.636.0 - 04.546.0 %   MCV 91.9 80.0 - 100.0 fL   MCH 29.4 26.0 - 34.0 pg   MCHC 32.0 30.0 - 36.0 g/dL   RDW 40.913.6 81.111.5 - 91.415.5 %   Platelets 177 150 - 400 K/uL   nRBC 0.0 0.0 - 0.2 %  Basic metabolic panel     Status: Abnormal   Collection Time: 03/05/18  3:46 AM  Result Value Ref Range   Sodium 133 (L) 135 - 145 mmol/L   Potassium  3.6 3.5 - 5.1 mmol/L   Chloride 102 98 - 111 mmol/L   CO2 26 22 - 32 mmol/L   Glucose, Bld 149 (H) 70 - 99 mg/dL   BUN 23 8 - 23 mg/dL   Creatinine, Ser 7.821.21 (H) 0.44 - 1.00 mg/dL   Calcium 7.7 (L) 8.9 - 10.3 mg/dL   GFR calc non Af Amer 40 (L) >60 mL/min   GFR calc Af Amer 47 (L) >60 mL/min   Anion gap 5 5 - 15    Dg Hip Port Unilat With Pelvis 1v Left  Result Date: 03/03/2018 CLINICAL DATA:  Status post left hip prosthesis.  Hip fracture. EXAM: DG HIP (WITH OR WITHOUT PELVIS) 1V PORT LEFT COMPARISON:  Radiographs 03/02/2018 FINDINGS: There is a bipolar hip prosthesis which appears well seated without complicating features. The right hip is normally located. The visualized bony pelvis is intact. IMPRESSION: Bipolar left hip prosthesis in good position without complicating features. Electronically Signed   By: Rudie MeyerP.  Gallerani M.D.   On: 03/03/2018  16:31    Assessment/Plan: 2 Days Post-Op   Active Problems:   Hip fracture (HCC)   Advance diet Up with therapy  Partial weight bearing left lower extremity Hopeful discharge tomorrow per medicine Acute blood loss anemia, will continue to watch, hemoglobin 9.8 this morning and not symptomatic   Altamese Cabal , PA-C 03/05/2018, 8:07 AM

## 2018-03-05 NOTE — Plan of Care (Signed)
Discussed with patient in front of family plan of care for the evening, pain management and activity up in chair with some teach back displayed.

## 2018-03-06 ENCOUNTER — Encounter
Admission: RE | Admit: 2018-03-06 | Discharge: 2018-03-06 | Disposition: A | Discharge status: Home / Self Care | Payer: Medicare HMO | Source: Ambulatory Visit | Attending: Internal Medicine | Admitting: Internal Medicine

## 2018-03-06 ENCOUNTER — Encounter: Payer: Self-pay | Admitting: Specialist

## 2018-03-06 LAB — BASIC METABOLIC PANEL
Anion gap: 5 (ref 5–15)
BUN: 27 mg/dL — AB (ref 8–23)
CO2: 22 mmol/L (ref 22–32)
CREATININE: 1.26 mg/dL — AB (ref 0.44–1.00)
Calcium: 7.5 mg/dL — ABNORMAL LOW (ref 8.9–10.3)
Chloride: 108 mmol/L (ref 98–111)
GFR calc Af Amer: 44 mL/min — ABNORMAL LOW (ref >60)
GFR calc non Af Amer: 38 mL/min — ABNORMAL LOW (ref >60)
Glucose, Bld: 112 mg/dL — ABNORMAL HIGH (ref 70–99)
POTASSIUM: 3.9 mmol/L (ref 3.5–5.1)
SODIUM: 135 mmol/L (ref 135–145)

## 2018-03-06 LAB — CBC
HCT: 30.3 % — ABNORMAL LOW (ref 36.0–46.0)
Hemoglobin: 9.5 g/dL — ABNORMAL LOW (ref 12.0–15.0)
MCH: 29.2 pg (ref 26.0–34.0)
MCHC: 31.4 g/dL (ref 30.0–36.0)
MCV: 93.2 fL (ref 80.0–100.0)
NRBC: 0 % (ref 0.0–0.2)
Platelets: 186 10*3/uL (ref 150–400)
RBC: 3.25 MIL/uL — AB (ref 3.87–5.11)
RDW: 13.7 % (ref 11.5–15.5)
WBC: 10.8 10*3/uL — ABNORMAL HIGH (ref 4.0–10.5)

## 2018-03-06 MED ORDER — HYDROCODONE-ACETAMINOPHEN 5-325 MG PO TABS: 1.0000 | ORAL_TABLET | Freq: Four times a day (QID) | ORAL | 0 refills | Status: AC | PRN

## 2018-03-06 MED ORDER — GABAPENTIN 300 MG PO CAPS: 300.0000 mg | ORAL_CAPSULE | Freq: Three times a day (TID) | ORAL | 0 refills | Status: DC

## 2018-03-06 MED ORDER — BISACODYL 5 MG PO TBEC: 5.0000 mg | DELAYED_RELEASE_TABLET | Freq: Every day | ORAL | 0 refills | Status: DC | PRN

## 2018-03-06 MED ORDER — GABAPENTIN 300 MG PO CAPS
300.0000 mg | ORAL_CAPSULE | Freq: Two times a day (BID) | ORAL | Status: DC
  Administered 2018-03-06 – 2018-03-07 (×2): 300 mg via ORAL
  Filled 2018-03-06 (×2): qty 1

## 2018-03-06 MED ORDER — ENOXAPARIN SODIUM 30 MG/0.3ML ~~LOC~~ SOLN: 30.0000 mg | SUBCUTANEOUS | 0 refills | Status: DC

## 2018-03-06 MED ORDER — ASPIRIN 325 MG PO TABS
325.0000 mg | ORAL_TABLET | Freq: Two times a day (BID) | ORAL | Status: DC
  Administered 2018-03-07: 325 mg via ORAL
  Filled 2018-03-06 (×2): qty 1

## 2018-03-06 MED ORDER — FERROUS SULFATE 325 (65 FE) MG PO TABS: 325.0000 mg | ORAL_TABLET | Freq: Every day | ORAL | 0 refills | Status: DC

## 2018-03-06 NOTE — Progress Notes (Signed)
Physical Therapy Treatment Patient Details Name: Diane Rogers MRN: [id number] DOB: 10/31/1928 Today's Date: 03/06/2018    History of Present Illness Pt is a 85 y/o F s/p fall and now with L hemiarthroplasty.      PT Comments    [name] demonstrated improvements towards her mobility goals today. She was able to ambulate 15 feet within the room along with therapeutic exercise this session. Mod A was provided for bed mobility with trunk and LE movement. Mod A was required for sit>stand and throughout gait. While ambulating pt demonstrated mild unsteadiness that required SPT support. Further PT is recommended to address balance deficits noted.    Follow Up Recommendations  SNF     Equipment Recommendations  3in1 (PT);Other (comment)(youth walker)    Recommendations for Other Services       Precautions / Restrictions Precautions Precautions: Fall;Posterior Hip Precaution Booklet Issued: Yes (comment) Precaution Comments: Pt able to recall 2/3 of hip precautions. SPT reviewed at end of session.  Restrictions Weight Bearing Restrictions: Yes LLE Weight Bearing: Partial weight bearing LLE Partial Weight Bearing Percentage or Pounds: 50    Mobility  Bed Mobility Overal bed mobility: Needs Assistance Bed Mobility: Supine to Sit     Supine to sit: Mod assist;HOB elevated     General bed mobility comments: Pt required less assist with UE and LE bed mobility this date. Pt demonstrated improved scooting.   Transfers Overall transfer level: Needs assistance Equipment used: (youth walker) Transfers: Sit to/from Stand Sit to Stand: Mod assist         General transfer comment: VC required for proper hand placement. Mod A provided by SPT for sit>stand. No assist required for stand>sit.   Ambulation/Gait Ambulation/Gait assistance: Min guard Gait Distance (Feet): 15 Feet Assistive device: (youth walker) Gait Pattern/deviations: Step-to pattern;Decreased step length  - right;Decreased step length - left;Decreased stance time - left     General Gait Details: SPT provided min assist for mild unsteadiness. Pt demonstrated better RW management.    Stairs             Wheelchair Mobility    Modified Rankin (Stroke Patients Only)       Balance Overall balance assessment: Needs assistance Sitting-balance support: Bilateral upper extremity supported;Feet supported Sitting balance-[name]: Poor Sitting balance - Comments: Pt requires BUE support for sitting at EOB.    Standing balance support: Bilateral upper extremity supported;During functional activity Standing balance-[name]: Poor Standing balance comment: Pt requires BUE for static and dynamic activies.                             Cognition Arousal/Alertness: Awake/alert Behavior During Therapy: WFL for tasks assessed/performed                                   General Comments: SPT provided simple step by step cues throughout session for all aspects of mobility.       Exercises General Exercises - Lower Extremity Ankle Circles/Pumps: AROM;10 reps;Supine;Both Long Arc Quad: AROM;Right;Left;10 reps;Seated Other Exercises Other Exercises: Pt instructed in static standing alternating marches with RW x6 each leg.     General Comments General comments (skin integrity, edema, etc.): Pt daughter present during session.      Pertinent Vitals/Pain Pain Assessment: 0-10 Pain Score: 2  Pain Location: LLE with mobility Pain Descriptors / Indicators: Aching;Grimacing;Guarding;Sore Pain Intervention(s):  Limited activity within patient's tolerance;Premedicated before session;Repositioned    Home Living                      Prior Function            PT Goals (current goals can now be found in the care plan section) Acute Rehab PT Goals Patient Stated Goal: to improve mobility PT Goal Formulation: With patient Time For Goal Achievement:  03/18/18 Potential to Achieve Goals: Good Progress towards PT goals: Progressing toward goals    Frequency    BID      PT Plan Current plan remains appropriate    Co-evaluation              AM-PAC PT "6 Clicks" Mobility   Outcome Measure  Help needed turning from your back to your side while in a flat bed without using bedrails?: A Lot Help needed moving from lying on your back to sitting on the side of a flat bed without using bedrails?: A Lot Help needed moving to and from a bed to a chair (including a wheelchair)?: A Lot Help needed standing up from a chair using your arms (e.g., wheelchair or bedside chair)?: A Lot Help needed to walk in hospital room?: A Little Help needed climbing 3-5 steps with a railing? : Total 6 Click Score: 12    End of Session Equipment Utilized During Treatment: Gait belt Activity Tolerance: Patient tolerated treatment well Patient left: in chair;with call bell/phone within reach;with chair alarm set;with family/visitor present;with SCD's reapplied Nurse Communication: Mobility status PT Visit Diagnosis: Pain;Unsteadiness on feet (R26.81);Other abnormalities of gait and mobility (R26.89);Muscle weakness (generalized) (M62.81) Pain - Right/Left: Left Pain - part of body: Hip     Time: [id number] PT Time Calculation (min) (ACUTE ONLY): 23 min  Charges:  $Gait Training: 8-22 mins $Therapeutic Exercise: 8-22 mins                    Emeline GinsMegan Fable Huisman, SPT   03/06/2018, 1:34 PM

## 2018-03-06 NOTE — Plan of Care (Signed)
Discussed with patient in front of family plan of care for the evening, pain management and teaching patient to ask for pain medication even if the its mild with some teach back displayed.

## 2018-03-06 NOTE — Progress Notes (Signed)
PT is recommending SNF. Patient and her 2 daughters Bonita Quin and Darl Pikes are aware and agreeable to SNF search in [place]. Clinical Child psychotherapist (CSW) presented bed offers to patient and her 2 daughters. CSW discussed quality measures of the facilities. Patient and daughters chose [facility]	[place]. Per [facility] admissions coordinator at [facility] she can accept patient in a private room and will start [facility] authorization today. Patient and daughters are aware that Francine Graven will have to approve SNF. CSW will continue to follow and assist as needed.   [employer], LCSW [phone]

## 2018-03-06 NOTE — Care Management Important Message (Signed)
Important Message  Patient Details  Name: Diane Rogers MRN: [id number] Date of Birth: February 28, 1928   Medicare Important Message Given:  Yes    Barrie Dunker, RN 03/06/2018, 11:21 AM

## 2018-03-06 NOTE — Progress Notes (Signed)
Subjective: 3 Days Post-Op Procedure(s) (LRB): ARTHROPLASTY BIPOLAR HIP (HEMIARTHROPLASTY)-LEFT (Left)   Out of bed in chair.  Alert and comfortable.  Seems to be doing very well.  Very little pain. Patient reports pain as mild.  Objective:   VITALS:   Vitals:   03/05/18 2316 03/06/18 0814  BP: (!) 115/45 (!) 134/50  Pulse: 68 76  Resp: 17 18  Temp: 97.7 F (36.5 C) 98.3 F (36.8 C)  SpO2: 94% 93%   Range of motion of hip is good.  Hip is stable. Neurologically intact ABD soft Neurovascular intact Sensation intact distally Intact pulses distally Dorsiflexion/Plantar flexion intact Incision: scant drainage  LABS Recent Labs    03/04/18 0431 03/05/18 0346 03/06/18 0318  HGB 11.7* 9.8* 9.5*  HCT 36.2 30.6* 30.3*  WBC 14.7* 11.2* 10.8*  PLT 215 177 186    Recent Labs    03/04/18 0431 03/05/18 0346 03/06/18 0318  NA 137 133* 135  K 4.1 3.6 3.9  BUN 16 23 27*  CREATININE 1.07* 1.21* 1.26*  GLUCOSE 151* 149* 112*    No results for input(s): LABPT, INR in the last 72 hours.   Assessment/Plan: 3 Days Post-Op Procedure(s) (LRB): ARTHROPLASTY BIPOLAR HIP (HEMIARTHROPLASTY)-LEFT (Left)   Advance diet Up with therapy Discharge to SNF   Discharge on enteric-coated aspirin 325 mg twice daily for 6 weeks  Return to clinic 2 weeks for x-ray and staple removal

## 2018-03-06 NOTE — Progress Notes (Signed)
Physical Therapy Treatment Patient Details Name: Diane Rogers MRN: [id number] DOB: 1928/07/02 Today's Date: 03/06/2018    History of Present Illness Pt is a 85 y/o F s/p fall and now with L hemiarthroplasty.      PT Comments    Diane Rogers was able to ambulate 45 ft this session. However, pt demonstrated poor RW management and LOB 3x while ambulating. SPT provided Min A with each LOB. Unsteadiness most noted when pt attempted to ambulate faster and displayed a lateral lean to the R. Despite max VC pt did not demonstrate safe RW management, hand placement on RW, or gait speed with RW this session. Further PT required for  RW management/safety, balance, and gait mechanics.     Follow Up Recommendations  SNF     Equipment Recommendations  3in1 (PT);Other (comment)(youth walker )    Recommendations for Other Services       Precautions / Restrictions Precautions Precautions: Fall;Posterior Hip Precaution Booklet Issued: Yes (comment) Precaution Comments: Pt able to recall 1/3 of hip precautions. Daughter aknowledged she will work on them with pt.  Restrictions Weight Bearing Restrictions: Yes LLE Weight Bearing: Partial weight bearing LLE Partial Weight Bearing Percentage or Pounds: 50    Mobility  Bed Mobility Overal bed mobility: Needs Assistance Bed Mobility: Supine to Sit     Supine to sit: Mod assist;HOB elevated     General bed mobility comments: Pt in chair upon arrival and requested to sit back in chair.   Transfers Overall transfer level: Needs assistance Equipment used: (youth walker) Transfers: Sit to/from Stand Sit to Stand: Mod assist         General transfer comment: VC required for proper hand placement. Mod A provided by SPT for sit>stand. No assist required for stand>sit.   Ambulation/Gait Ambulation/Gait assistance: Min assist Gait Distance (Feet): 45 Feet Assistive device: (youth walker) Gait Pattern/deviations: Step-to  pattern;Decreased step length - right;Decreased step length - left;Decreased stance time - left;Decreased weight shift to left     General Gait Details: Pt demonstrated LOB 3x while ambulating. SPT provided Min A for unsteadiness with each LOB. Pts trunk leaned to R when she had LOB. Max VC necessary for pt to keep RW on the floor, proper hand placement, and to slow down while ambulating. Pt exhibited most unsteadiness when attempting to walk faster.    Stairs             Wheelchair Mobility    Modified Rankin (Stroke Patients Only)       Balance Overall balance assessment: Needs assistance Sitting-balance support: Single extremity supported;Feet supported Sitting balance-[name]: Poor Sitting balance - Comments: Pt able to maintain balance with 1 UE support.    Standing balance support: Bilateral upper extremity supported;During functional activity Standing balance-[name]: Poor Standing balance comment: Pt requires BUE for static and dynamic activies.                             Cognition Arousal/Alertness: Awake/alert Behavior During Therapy: Impulsive;WFL for tasks assessed/performed                                   General Comments: Pt did not maintain safe gait speed depsite VC provided by SPT.      Exercises General Exercises - Lower Extremity Ankle Circles/Pumps: AROM;10 reps;Supine;Both Long Arc Quad: AROM;Strengthening;Left;10 reps;Seated;Other (comment)(3 second holds in  ext) Other Exercises Other Exercises: Pt instructed in STS x2 with RW. SPT min guard for safety.     General Comments General comments (skin integrity, edema, etc.): Pt daughter present for session.       Pertinent Vitals/Pain Pain Assessment: Faces Pain Score: 2  Faces Pain Scale: Hurts a little bit Pain Location: LLE with mobility Pain Descriptors / Indicators: Aching;Discomfort;Grimacing;Guarding;Moaning;Sore Pain Intervention(s): Limited  activity within patient's tolerance;Monitored during session    Home Living                      Prior Function            PT Goals (current goals can now be found in the care plan section) Acute Rehab PT Goals Patient Stated Goal: to improve mobility PT Goal Formulation: With patient Time For Goal Achievement: 03/18/18 Potential to Achieve Goals: Good Progress towards PT goals: Progressing toward goals    Frequency    BID      PT Plan Current plan remains appropriate    Co-evaluation              AM-PAC PT "6 Clicks" Mobility   Outcome Measure  Help needed turning from your back to your side while in a flat bed without using bedrails?: A Lot Help needed moving from lying on your back to sitting on the side of a flat bed without using bedrails?: A Lot Help needed moving to and from a bed to a chair (including a wheelchair)?: A Lot Help needed standing up from a chair using your arms (e.g., wheelchair or bedside chair)?: A Lot Help needed to walk in hospital room?: A Little Help needed climbing 3-5 steps with a railing? : Total 6 Click Score: 12    End of Session Equipment Utilized During Treatment: Gait belt Activity Tolerance: Patient tolerated treatment well Patient left: in chair;with call bell/phone within reach;with chair alarm set;with family/visitor present;with SCD's reapplied Nurse Communication: Mobility status PT Visit Diagnosis: Pain;Unsteadiness on feet (R26.81);Other abnormalities of gait and mobility (R26.89);Muscle weakness (generalized) (M62.81) Pain - Right/Left: Left Pain - part of body: Hip     Time: 2025-4270 PT Time Calculation (min) (ACUTE ONLY): 28 min  Charges:  $Gait Training: 8-22 mins $Therapeutic Exercise: 8-22 mins                     Emeline Gins, SPT  03/06/2018, 4:05 PM

## 2018-03-06 NOTE — Progress Notes (Signed)
[facility] [place] at [facility]   PATIENT NAME: Diane Rogers    MR#:  [id number]  DATE OF BIRTH:  Nov 08, 1928  SUBJECTIVE:  CHIEF COMPLAINT:   Chief Complaint  Patient presents with  . Fall   No new complaint this morning.  Patient resting comfortably.  S/p surgery 03/03/18 The patient has no complaints. REVIEW OF SYSTEMS:  Review of Systems  Constitutional: Negative for chills and fever.  HENT: Negative for hearing loss and tinnitus.   Eyes: Negative for blurred vision, double vision and photophobia.  Respiratory: Negative for cough, hemoptysis, sputum production and shortness of breath.   Cardiovascular: Negative for chest pain, palpitations and orthopnea.  Gastrointestinal: Negative for abdominal pain, heartburn, nausea and vomiting.  Genitourinary: Negative for dysuria and frequency.  Musculoskeletal: Negative for myalgias.       Fall with left hip fracture prior to admission  Skin: Negative for itching and rash.  Neurological: Negative for dizziness and headaches. Loss of consciousness:    Psychiatric/Behavioral: Negative for depression and hallucinations.      DRUG ALLERGIES:   Allergies  Allergen Reactions  . Penicillins    VITALS:  Blood pressure (!) 134/50, pulse 76, temperature 98.3 F (36.8 C), temperature source Oral, resp. rate 18, height 5\' 1"  (1.549 m), weight 63.5 kg, SpO2 93 %. PHYSICAL EXAMINATION:  Physical Exam  Constitutional: She is oriented to person, place, and time and well-developed, well-nourished, and in no distress.  HENT:  Head: Normocephalic.  Eyes: Pupils are equal, round, and reactive to light. Conjunctivae and EOM are normal.  Neck: Normal range of motion. Neck supple. No tracheal deviation present.  Cardiovascular: Normal rate, regular rhythm and normal heart sounds.  Pulmonary/Chest: Effort normal and breath sounds normal.  Abdominal: Soft. Bowel sounds are normal. She exhibits no distension. There is no  abdominal tenderness.  Musculoskeletal:        General: No edema.     Comments: Status post left hip surgery  Neurological: She is alert and oriented to person, place, and time.  Skin: Skin is warm and dry. She is not diaphoretic.  Psychiatric: Affect and judgment normal.   LABORATORY PANEL:  Female CBC Recent Labs  Lab 03/06/18 0318  WBC 10.8*  HGB 9.5*  HCT 30.3*  PLT 186   ------------------------------------------------------------------------------------------------------------------ Chemistries  Recent Labs  Lab 03/04/18 0431  03/06/18 0318  NA 137   < > 135  K 4.1   < > 3.9  CL 108   < > 108  CO2 24   < > 22  GLUCOSE 151*   < > 112*  BUN 16   < > 27*  CREATININE 1.07*   < > 1.26*  CALCIUM 7.9*   < > 7.5*  MG 2.0  --   --    < > = values in this interval not displayed.   RADIOLOGY:  No results found. ASSESSMENT AND PLAN:   1. Acute left hip fracture Status post mechanical fall Patient is very functional at baseline.  Status post surgery 03/03/18 Continue PT.  Discontinue Lovenox, change to aspirin 325 mg twice daily for 6 weeks for DVT prophylaxis per Dr. Hyacinth Meeker.  2.Acute left ankle pain X-ray suspicious for left avulsion fracture of the malleolus No pain , orthopedic surgery following  3.  Hypertensive urgency accelerated hypertension Exacerbated by pain. Continue home regimen.  PRN IV hydralazine.  Monitor and adjust dose of meds as needed  4.History of hyperlipidemia LDL of 98.  Lifestyle  modifications  5.  Urinary retention UA is negative. Patient was also appearing slightly dehydrated so started on IV fluid and advised to give a trial of catheter removal again today.  6.  Acute renal failure due to dehydration On IV fluid.  7.  Acute blood loss anemia Due to surgery, stable.  PT evaluation suggest skilled nursing facility placement.  Both daughters agreed. I discussed with Dr. Hyacinth Meeker. All the records are reviewed and case discussed with  Care Management/Social Worker. Management plans discussed with the patient, her 2 daughters and they are in agreement.  CODE STATUS: Full Code  TOTAL TIME TAKING CARE OF THIS PATIENT: 36 minutes.   More than 50% of the time was spent in counseling/coordination of care: YES  POSSIBLE D/C IN 1-2 DAYS, DEPENDING ON CLINICAL CONDITION. Patient's daughter was present in the room during my visit.  Shaune Pollack M.D on 03/06/2018 at 2:55 PM  Between 7am to 6pm - Pager - [phone]  After 6pm go to [email or web address] - Social research officer, government  [facility]  Office  [phone]  CC: Primary care physician; [facility], [place]  Note: This dictation was prepared with Dragon dictation along with smaller phrase technology. Any transcriptional errors that result from this process are unintentional.

## 2018-03-06 NOTE — Progress Notes (Addendum)
[facility] - [place] at [facility] was admitted to the Hospital on 03/02/2018 and Discharged  03/06/2018 and should be excused from work/school   for 6  days starting 03/02/2018 , may return to work/school without any restrictions.  Shaune Pollack M.D on 03/06/2018,at 10:25 AM  [facility] - [place] at [facility]  [phone]

## 2018-03-06 NOTE — Clinical Social Work Placement (Signed)
   CLINICAL SOCIAL WORK PLACEMENT  NOTE  Date:  03/06/2018  Patient Details  Name: Diane Rogers MRN: [id number] Date of Birth: 03/25/1928  Clinical Social Work is seeking post-discharge placement for this patient at the Skilled  Nursing Facility level of care (*CSW will initial, date and re-position this form in  chart as items are completed):  Yes   Patient/family provided with [place] Clinical Social Work Department's list of facilities offering this level of care within the geographic area requested by the patient (or if unable, by the patient's family).  Yes   Patient/family informed of their freedom to choose among providers that offer the needed level of care, that participate in Medicare, Medicaid or managed care program needed by the patient, have an available bed and are willing to accept the patient.  Yes   Patient/family informed of [facility]'s ownership interest in [facility] and [facility], as well as of the fact that they are under no obligation to receive care at these facilities.  PASRR submitted to EDS on 03/03/18     PASRR number received on 03/03/18     Existing PASRR number confirmed on       FL2 transmitted to all facilities in geographic area requested by pt/family on 03/05/18     FL2 transmitted to all facilities within larger geographic area on       Patient informed that his/her managed care company has contracts with or will negotiate with certain facilities, including the following:        Yes   Patient/family informed of bed offers received.  Patient chooses bed at [facility] )     Physician recommends and patient chooses bed at      Patient to be transferred to   on  .  Patient to be transferred to facility by       Patient family notified on   of transfer.  Name of family member notified:        PHYSICIAN       Additional Comment:    _______________________________________________ Tiauna Whisnant, Darleen Crocker,  LCSW 03/06/2018, 11:49 AM

## 2018-03-07 DIAGNOSIS — S72001A Fracture of unspecified part of neck of right femur, initial encounter for closed fracture: Secondary | ICD-10-CM | POA: Diagnosis not present

## 2018-03-07 DIAGNOSIS — D649 Anemia, unspecified: Secondary | ICD-10-CM | POA: Diagnosis not present

## 2018-03-07 DIAGNOSIS — M810 Age-related osteoporosis without current pathological fracture: Secondary | ICD-10-CM | POA: Diagnosis not present

## 2018-03-07 DIAGNOSIS — E785 Hyperlipidemia, unspecified: Secondary | ICD-10-CM | POA: Diagnosis not present

## 2018-03-07 DIAGNOSIS — M8000XD Age-related osteoporosis with current pathological fracture, unspecified site, subsequent encounter for fracture with routine healing: Secondary | ICD-10-CM | POA: Diagnosis not present

## 2018-03-07 DIAGNOSIS — Z96642 Presence of left artificial hip joint: Secondary | ICD-10-CM | POA: Diagnosis not present

## 2018-03-07 DIAGNOSIS — I10 Essential (primary) hypertension: Secondary | ICD-10-CM | POA: Diagnosis not present

## 2018-03-07 DIAGNOSIS — D638 Anemia in other chronic diseases classified elsewhere: Secondary | ICD-10-CM | POA: Diagnosis not present

## 2018-03-07 DIAGNOSIS — G6289 Other specified polyneuropathies: Secondary | ICD-10-CM | POA: Diagnosis not present

## 2018-03-07 DIAGNOSIS — Z9181 History of falling: Secondary | ICD-10-CM | POA: Diagnosis not present

## 2018-03-07 DIAGNOSIS — W19XXXA Unspecified fall, initial encounter: Secondary | ICD-10-CM | POA: Diagnosis not present

## 2018-03-07 DIAGNOSIS — M25572 Pain in left ankle and joints of left foot: Secondary | ICD-10-CM | POA: Diagnosis not present

## 2018-03-07 DIAGNOSIS — S72002S Fracture of unspecified part of neck of left femur, sequela: Secondary | ICD-10-CM | POA: Diagnosis not present

## 2018-03-07 DIAGNOSIS — M25571 Pain in right ankle and joints of right foot: Secondary | ICD-10-CM | POA: Diagnosis not present

## 2018-03-07 DIAGNOSIS — Z7401 Bed confinement status: Secondary | ICD-10-CM | POA: Diagnosis not present

## 2018-03-07 DIAGNOSIS — E559 Vitamin D deficiency, unspecified: Secondary | ICD-10-CM | POA: Diagnosis not present

## 2018-03-07 DIAGNOSIS — R5381 Other malaise: Secondary | ICD-10-CM | POA: Diagnosis not present

## 2018-03-07 DIAGNOSIS — S72012D Unspecified intracapsular fracture of left femur, subsequent encounter for closed fracture with routine healing: Secondary | ICD-10-CM | POA: Diagnosis not present

## 2018-03-07 DIAGNOSIS — M6281 Muscle weakness (generalized): Secondary | ICD-10-CM | POA: Diagnosis not present

## 2018-03-07 DIAGNOSIS — N179 Acute kidney failure, unspecified: Secondary | ICD-10-CM | POA: Diagnosis not present

## 2018-03-07 DIAGNOSIS — M2550 Pain in unspecified joint: Secondary | ICD-10-CM | POA: Diagnosis not present

## 2018-03-07 DIAGNOSIS — D62 Acute posthemorrhagic anemia: Secondary | ICD-10-CM | POA: Diagnosis not present

## 2018-03-07 LAB — BASIC METABOLIC PANEL
Anion gap: 3 — ABNORMAL LOW (ref 5–15)
BUN: 19 mg/dL (ref 8–23)
CO2: 24 mmol/L (ref 22–32)
Calcium: 7.7 mg/dL — ABNORMAL LOW (ref 8.9–10.3)
Chloride: 111 mmol/L (ref 98–111)
Creatinine, Ser: 1.02 mg/dL — ABNORMAL HIGH (ref 0.44–1.00)
GFR calc Af Amer: 57 mL/min — ABNORMAL LOW (ref >60)
GFR, EST NON AFRICAN AMERICAN: 49 mL/min — AB (ref >60)
GLUCOSE: 109 mg/dL — AB (ref 70–99)
Potassium: 3.9 mmol/L (ref 3.5–5.1)
Sodium: 138 mmol/L (ref 135–145)

## 2018-03-07 MED ORDER — ASPIRIN 325 MG PO TABS: 325.0000 mg | ORAL_TABLET | Freq: Two times a day (BID) | ORAL | Status: DC

## 2018-03-07 NOTE — Progress Notes (Signed)
Per [place] admissions coordinator at [facility] authorization is still pending.   Gordy Goar, LCSW [phone]  

## 2018-03-07 NOTE — Progress Notes (Signed)
Report called in to [place] at [facility].  Suzan Slick, RN

## 2018-03-07 NOTE — Progress Notes (Signed)
Pt transported to [facility].

## 2018-03-07 NOTE — Progress Notes (Signed)
Patient is medically stable for D/C to [facility] today. Per [facility] admissions coordinator at [facility] authorization has been received and patient can come today to room 213. RN will call report at [phone] and arrange EMS for transport. Clinical Child psychotherapist (CSW) sent D/C orders to [facility] via [employer]. Patient is aware of above. Patient's daughters Bonita Quin and Darl Pikes are at bedside and aware of above. Please reconsult if future social work needs arise. CSW signing off.   [employer], LCSW [phone]

## 2018-03-07 NOTE — Discharge Summary (Addendum)
[facility] - [place] at [facility]   PATIENT NAME: Diane Rogers    MR#:  [id number]  DATE OF BIRTH:  16-Dec-1928  DATE OF ADMISSION:  03/02/2018   ADMITTING PHYSICIAN: Bertrum Sol, MD  DATE OF DISCHARGE:  03/07/2018 PRIMARY CARE PHYSICIAN: [facility], [place]   ADMISSION DIAGNOSIS:  Fall [W19.XXXA] Fall, initial encounter [id number]] Subcapital fracture of hip, left, closed, initial encounter (HCC) [S72.012A] Acute left ankle pain [M25.572] DISCHARGE DIAGNOSIS:  Active Problems:   Hip fracture (HCC)  SECONDARY DIAGNOSIS:   Past Medical History:  Diagnosis Date  . Depression   . Glucose intolerance (impaired glucose tolerance)   . Hyperlipidemia   . Hypertension   . Ovarian failure    HOSPITAL COURSE:  1. Acuteleft hip fracture Status post mechanical fall Patient is very functional at baseline.  Status post surgery 03/03/18 Continue PT.  Discontinued Lovenox, changed to aspirin 325 mg twice daily for 6 weeks for DVT prophylaxis per Dr. Hyacinth Meeker.  2.Acute left ankle pain X-ray suspicious for left avulsion fracture of the malleolus No pain , orthopedic surgery following  3.  Hypertensive urgency accelerated hypertension Exacerbated by pain. Continue home regimen.  PRN IV hydralazine.  Monitor and adjust dose of meds as needed  4.History of hyperlipidemia LDL of 98.  Lifestyle modifications  5.  Urinary retention UA is negative.  6.  Acute renal failure due to dehydration Improved with IV fluid.  7.  Acute blood loss anemia Due to surgery, stable.  PT evaluation suggest skilled nursing facility placement. DISCHARGE CONDITIONS:  Stable, discharge to SNF today. CONSULTS OBTAINED:  Treatment Team:  Deeann Saint, MD DRUG ALLERGIES:   Allergies  Allergen Reactions  . Penicillins    DISCHARGE MEDICATIONS:   Allergies as of 03/07/2018      Reactions   Penicillins       Medication List    STOP taking  these medications   hydrochlorothiazide 25 MG tablet Commonly known as:  HYDRODIURIL     TAKE these medications   amLODipine-benazepril 10-20 MG capsule Commonly known as:  LOTREL Take 1 capsule by mouth daily.   aspirin 325 MG tablet Take 1 tablet (325 mg total) by mouth 2 (two) times daily.   bisacodyl 5 MG EC tablet Commonly known as:  bisacodyl Take 1 tablet (5 mg total) by mouth daily as needed for moderate constipation.   enoxaparin 30 MG/0.3ML injection Commonly known as:  LOVENOX Inject 0.3 mLs (30 mg total) into the skin daily.   ferrous sulfate 325 (65 FE) MG tablet Take 1 tablet (325 mg total) by mouth daily with breakfast.   gabapentin 300 MG capsule Commonly known as:  NEURONTIN Take 1 capsule (300 mg total) by mouth 3 (three) times daily.   HYDROcodone-acetaminophen 5-325 MG tablet Commonly known as:  NORCO/VICODIN Take 1 tablet by mouth every 6 (six) hours as needed for up to 3 days for moderate pain (pain score 4-6).   Vitamin D (Ergocalciferol) 1.25 MG (50000 UT) Caps capsule Commonly known as:  DRISDOL Take one capsule weekly For 12 weeks.        DISCHARGE INSTRUCTIONS:  See AVS.  If you experience worsening of your admission symptoms, develop shortness of breath, life threatening emergency, suicidal or homicidal thoughts you must seek medical attention immediately by calling 911 or calling your MD immediately  if symptoms less severe.  You Must read complete instructions/literature along with all the possible adverse reactions/side effects for all the Medicines you  take and that have been prescribed to you. Take any new Medicines after you have completely understood and accpet all the possible adverse reactions/side effects.   Please note  You were cared for by a hospitalist during your hospital stay. If you have any questions about your discharge medications or the care you received while you were in the hospital after you are discharged, you can  call the unit and asked to speak with the hospitalist on call if the hospitalist that took care of you is not available. Once you are discharged, your primary care physician will handle any further medical issues. Please note that NO REFILLS for any discharge medications will be authorized once you are discharged, as it is imperative that you return to your primary care physician (or establish a relationship with a primary care physician if you do not have one) for your aftercare needs so that they can reassess your need for medications and monitor your lab values.    On the day of Discharge:  VITAL SIGNS:  Blood pressure (!) 158/43, pulse 80, temperature 98.6 F (37 C), temperature source Oral, resp. rate 16, height 5\' 1"  (1.549 m), weight 63.5 kg, SpO2 98 %. PHYSICAL EXAMINATION:  GENERAL:  85 y.o.-year-old patient lying in the bed with no acute distress.  EYES: Pupils equal, round, reactive to light and accommodation. No scleral icterus. Extraocular muscles intact.  HEENT: Head atraumatic, normocephalic. Oropharynx and nasopharynx clear.  NECK:  Supple, no jugular venous distention. No thyroid enlargement, no tenderness.  LUNGS: Normal breath sounds bilaterally, no wheezing, rales,rhonchi or crepitation. No use of accessory muscles of respiration.  CARDIOVASCULAR: S1, S2 normal. No murmurs, rubs, or gallops.  ABDOMEN: Soft, non-tender, non-distended. Bowel sounds present. No organomegaly or mass.  EXTREMITIES: No pedal edema, cyanosis, or clubbing.  NEUROLOGIC: Cranial nerves II through XII are intact. Muscle strength 4/5 in all extremities. Sensation intact. Gait not checked.  PSYCHIATRIC: The patient is alert and oriented x 3.  SKIN: No obvious rash, lesion, or ulcer.  DATA REVIEW:   CBC Recent Labs  Lab 03/06/18 0318  WBC 10.8*  HGB 9.5*  HCT 30.3*  PLT 186    Chemistries  Recent Labs  Lab 03/04/18 0431  03/07/18 0409  NA 137   < > 138  K 4.1   < > 3.9  CL 108   < > 111    CO2 24   < > 24  GLUCOSE 151*   < > 109*  BUN 16   < > 19  CREATININE 1.07*   < > 1.02*  CALCIUM 7.9*   < > 7.7*  MG 2.0  --   --    < > = values in this interval not displayed.     Microbiology Results  Results for orders placed or performed during the hospital encounter of 03/02/18  Surgical pcr screen     Status: Abnormal   Collection Time: 03/02/18  8:08 PM  Result Value Ref Range Status   MRSA, PCR POSITIVE (A) NEGATIVE Final   Staphylococcus aureus POSITIVE (A) NEGATIVE Final    Comment: CRITICAL RESULT CALLED TO, READ BACK BY AND VERIFIED WITH: CALLED TO Arlee MuslimLIVIA KIBERENG RN @2135  [facility] (NOTE) The Xpert SA Assay (FDA approved for NASAL specimens in patients 83 years of age and older), is one component of a comprehensive surveillance program. It is not intended to diagnose infection nor to guide or monitor treatment. Performed at [facility], [address] [place], [place]  [phone]     RADIOLOGY:  No results found.   Management plans discussed with the patient, family and they are in agreement.  CODE STATUS: Full Code   TOTAL TIME TAKING CARE OF THIS PATIENT: 33 minutes.    Shaune PollackQing Hakan Nudelman M.D on 03/07/2018 at 2:21 PM  Between 7am to 6pm - Pager - [phone]  After 6pm go to [email or web address] - Social research officer, governmentpassword EPAS [facility]  [facility] [place] [facility]  Office  [phone]  CC: Primary care physician; [facility], [place]   Note: This dictation was prepared with Dragon dictation along with smaller phrase technology. Any transcriptional errors that result from this process are unintentional.

## 2018-03-07 NOTE — Clinical Social Work Placement (Signed)
   CLINICAL SOCIAL WORK PLACEMENT  NOTE  Date:  03/07/2018  Patient Details  Name: Diane Rogers MRN: [id number] Date of Birth: 1928/12/07  Clinical Social Work is seeking post-discharge placement for this patient at the Skilled  Nursing Facility level of care (*CSW will initial, date and re-position this form in  chart as items are completed):  Yes   Patient/family provided with [facility] Clinical Social Work Department's list of facilities offering this level of care within the geographic area requested by the patient (or if unable, by the patient's family).  Yes   Patient/family informed of their freedom to choose among providers that offer the needed level of care, that participate in Medicare, Medicaid or managed care program needed by the patient, have an available bed and are willing to accept the patient.  Yes   Patient/family informed of [facility] [facility]'s ownership interest in [facility] and [facility], as well as of the fact that they are under no obligation to receive care at these facilities.  PASRR submitted to EDS on 03/03/18     PASRR number received on 03/03/18     Existing PASRR number confirmed on       FL2 transmitted to all facilities in geographic area requested by pt/family on 03/05/18     FL2 transmitted to all facilities within larger geographic area on       Patient informed that his/her managed care company has contracts with or will negotiate with certain facilities, including the following:        Yes   Patient/family informed of bed offers received.  Patient chooses bed at [facility] )     Physician recommends and patient chooses bed at      Patient to be transferred to [facility] ) on 03/07/18.  Patient to be transferred to facility by [facility] )     Patient family notified on 03/07/18 of transfer.  Name of family member notified:  (Patient's daughters Bonita Quin and Darl Pikes are aware of D/C today. )     PHYSICIAN       Additional Comment:    _______________________________________________ Harrel Ferrone, Darleen Crocker, LCSW 03/07/2018, 3:01 PM

## 2018-03-07 NOTE — Progress Notes (Signed)
Physical Therapy Treatment Patient Details Name: Diane Rogers MRN: [id number] DOB: 12-24-1928 Today's Date: 03/07/2018    History of Present Illness Pt is a 85 y/o F s/p fall and now with L hemiarthroplasty.      PT Comments    Participated in exercises as described below.  To edge of bed with mod a x 1.  Once sitting, she was able to sit without support today but required supervision for safety.  Stood with mod a x 1 and transferred to commode to void.  Inc urine in bed and on floor.  She was able to increase her gait to 110' PWB RLE today.  She continues to require close min a x 1 for safety with frequent verbal cues for speed and WB status.  Daughter does report improvement over yesterday.  She becomes labile during session stating "I have so far to go."  Encouragement and education given regarding recovery and expectations at rehab.   Follow Up Recommendations  SNF     Equipment Recommendations       Recommendations for Other Services       Precautions / Restrictions Precautions Precautions: Fall;Posterior Hip Restrictions Weight Bearing Restrictions: Yes LLE Weight Bearing: Partial weight bearing LLE Partial Weight Bearing Percentage or Pounds: 50    Mobility  Bed Mobility Overal bed mobility: Needs Assistance Bed Mobility: Supine to Sit     Supine to sit: Mod assist;HOB elevated        Transfers Overall transfer level: Needs assistance   Transfers: Sit to/from Stand Sit to Stand: Mod assist            Ambulation/Gait Ambulation/Gait assistance: Min assist Gait Distance (Feet): 110 Feet Assistive device: Rolling walker (2 wheeled) Gait Pattern/deviations: Step-to pattern;Step-through pattern;Decreased step length - right;Decreased stance time - right;Decreased weight shift to right Gait velocity: decreased       Stairs             Wheelchair Mobility    Modified Rankin (Stroke Patients Only)       Balance Overall balance  assessment: Needs assistance Sitting-balance support: Single extremity supported;Feet supported Sitting balance-[name]: Fair Sitting balance - Comments: supervision required but able to sit unsupported.   Standing balance support: Bilateral upper extremity supported;During functional activity Standing balance-[name]: Poor Standing balance comment: Pt requires BUE for static and dynamic activies.                             Cognition Arousal/Alertness: Awake/alert Behavior During Therapy: WFL for tasks assessed/performed Overall Cognitive Status: Within Functional Limits for tasks assessed                                 General Comments: Overall increased safety today but continues to require cues      Exercises Other Exercises Other Exercises: supine quad and glut sets, ankle pumps, heel slide, ab/add x 10 Other Exercises: toc ommode to void.  inc urine in bed and on floor during transfer.    General Comments        Pertinent Vitals/Pain Pain Assessment: Faces Faces Pain Scale: Hurts a little bit Pain Location: LLE with mobility Pain Descriptors / Indicators: Discomfort;Guarding;Sore Pain Intervention(s): Limited activity within patient's tolerance    Home Living  Prior Function            PT Goals (current goals can now be found in the care plan section) Progress towards PT goals: Progressing toward goals    Frequency    BID      PT Plan Current plan remains appropriate    Co-evaluation              AM-PAC PT "6 Clicks" Mobility   Outcome Measure  Help needed turning from your back to your side while in a flat bed without using bedrails?: A Lot Help needed moving from lying on your back to sitting on the side of a flat bed without using bedrails?: A Lot Help needed moving to and from a bed to a chair (including a wheelchair)?: A Lot Help needed standing up from a chair using your arms  (e.g., wheelchair or bedside chair)?: A Lot Help needed to walk in hospital room?: A Little Help needed climbing 3-5 steps with a railing? : Total 6 Click Score: 12    End of Session Equipment Utilized During Treatment: Gait belt Activity Tolerance: Patient tolerated treatment well Patient left: in chair;with call bell/phone within reach;with chair alarm set;with family/visitor present   Pain - Right/Left: Left Pain - part of body: Hip     Time: 7371-0626 PT Time Calculation (min) (ACUTE ONLY): 40 min  Charges:  $Gait Training: 8-22 mins $Therapeutic Exercise: 8-22 mins $Therapeutic Activity: 8-22 mins                     Danielle Dess, PTA 03/07/18, 11:25 AM

## 2018-03-07 NOTE — Progress Notes (Signed)
EMS called for transport to [facility]. Family at bedside and aware. Patient's belongings packed up. Printed AVS and hard scripts placed in discharge packet.  Suzan Slick, RN

## 2018-03-07 NOTE — Discharge Instructions (Signed)
Fall precaution. PT. ASA bid for 6 weeks per Dr. Hyacinth Meeker.

## 2018-03-08 ENCOUNTER — Other Ambulatory Visit: Payer: Self-pay | Admitting: Adult Health

## 2018-03-08 ENCOUNTER — Encounter: Payer: Self-pay | Admitting: Adult Health

## 2018-03-08 ENCOUNTER — Non-Acute Institutional Stay (SKILLED_NURSING_FACILITY): Payer: Medicare HMO | Admitting: Adult Health

## 2018-03-08 DIAGNOSIS — I10 Essential (primary) hypertension: Secondary | ICD-10-CM | POA: Diagnosis not present

## 2018-03-08 DIAGNOSIS — D638 Anemia in other chronic diseases classified elsewhere: Secondary | ICD-10-CM

## 2018-03-08 DIAGNOSIS — E785 Hyperlipidemia, unspecified: Secondary | ICD-10-CM

## 2018-03-08 DIAGNOSIS — E559 Vitamin D deficiency, unspecified: Secondary | ICD-10-CM | POA: Diagnosis not present

## 2018-03-08 DIAGNOSIS — G6289 Other specified polyneuropathies: Secondary | ICD-10-CM | POA: Diagnosis not present

## 2018-03-08 DIAGNOSIS — D62 Acute posthemorrhagic anemia: Secondary | ICD-10-CM | POA: Diagnosis not present

## 2018-03-08 DIAGNOSIS — S72002S Fracture of unspecified part of neck of left femur, sequela: Secondary | ICD-10-CM | POA: Diagnosis not present

## 2018-03-08 DIAGNOSIS — M8000XD Age-related osteoporosis with current pathological fracture, unspecified site, subsequent encounter for fracture with routine healing: Secondary | ICD-10-CM | POA: Diagnosis not present

## 2018-03-08 LAB — SURGICAL PATHOLOGY

## 2018-03-08 MED ORDER — HYDROCODONE-ACETAMINOPHEN 5-325 MG PO TABS: 1.0000 | ORAL_TABLET | Freq: Four times a day (QID) | ORAL | 0 refills | Status: AC | PRN

## 2018-03-08 NOTE — Progress Notes (Signed)
[facility]   [place] [facility] Room Number: 213 A Place of Service:  SNF (31)   CODE STATUS: Full Code  Allergies  Allergen Reactions  . Penicillins     Chief Complaint  Patient presents with  . Hospitalization Follow-up    Hospital follow up    HPI:  She has been hospitalized after a mechanic fall and suffered a subcapital fracture of her left hip. She did have acute left ankle pain with an x-ray suspicious for a left avulsion fracture of the malleolus; orthopedic surgery is following her. She is here for short term with her goal to return back home. She denies any uncontrolled pain; no constipation; no changes in appetite. Will continue to follow her for her chronic illnesses including: hypertension; dyslipidemia; anemia.   Past Medical History:  Diagnosis Date  . Depression   . Glucose intolerance (impaired glucose tolerance)   . Hyperlipidemia   . Hypertension   . Ovarian failure     Past Surgical History:  Procedure Laterality Date  . HIP ARTHROPLASTY Left 03/03/2018   Procedure: ARTHROPLASTY BIPOLAR HIP (HEMIARTHROPLASTY)-LEFT;  Surgeon: Deeann SaintMiller, Howard, MD;  Location: [facility] ORS;  Service: Orthopedics;  Laterality: Left;  . NO PAST SURGERIES      Social History   Socioeconomic History  . Marital status: Widowed    Spouse name: Not on file  . Number of children: Not on file  . Years of education: Not on file  . Highest education level: Not on file  Occupational History  . Not on file  Social Needs  . Financial resource strain: Not on file  . Food insecurity:    Worry: Not on file    Inability: Not on file  . Transportation needs:    Medical: Not on file    Non-medical: Not on file  Tobacco Use  . Smoking status: Never Smoker  . Smokeless tobacco: Never Used  Substance and Sexual Activity  . Alcohol use: No    Alcohol/week: 0.0 standard drinks  . Drug use: No  . Sexual activity: Not on file  Lifestyle  . Physical activity:   Days per week: Not on file    Minutes per session: Not on file  . Stress: Not on file  Relationships  . Social connections:    Talks on phone: Not on file    Gets together: Not on file    Attends religious service: Not on file    Active member of club or organization: Not on file    Attends meetings of clubs or organizations: Not on file    Relationship status: Not on file  . Intimate partner violence:    Fear of current or ex partner: Not on file    Emotionally abused: Not on file    Physically abused: Not on file    Forced sexual activity: Not on file  Other Topics Concern  . Not on file  Social History Narrative  . Not on file   History reviewed. No pertinent family history.    VITAL SIGNS BP (!) 171/56   Pulse 84   Temp 97.8 F (36.6 C)   Resp 18   Ht 5\' 1"  (1.549 m)   Wt 140 lb (63.5 kg)   SpO2 98%   BMI 26.45 kg/m   Outpatient Encounter Medications as of 03/08/2018  Medication Sig  . amLODipine-benazepril (LOTREL) 10-20 MG capsule Take 1 capsule by mouth daily.  Marland Kitchen. aspirin 325 MG tablet Take 1 tablet (325  mg total) by mouth 2 (two) times daily.  . bisacodyl (BISACODYL) 5 MG EC tablet Take 1 tablet (5 mg total) by mouth daily as needed for moderate constipation.  . enoxaparin (LOVENOX) 30 MG/0.3ML injection Inject 0.3 mLs (30 mg total) into the skin daily.  . ferrous sulfate 325 (65 FE) MG tablet Take 1 tablet (325 mg total) by mouth daily with breakfast.  . gabapentin (NEURONTIN) 300 MG capsule Take 1 capsule (300 mg total) by mouth 3 (three) times daily.  Marland Kitchen. HYDROcodone-acetaminophen (NORCO/VICODIN) 5-325 MG tablet Take 1 tablet by mouth every 6 (six) hours as needed for up to 3 days for moderate pain (pain score 4-6).  . NON FORMULARY Diet Type:  Regular  . Vitamin D, Ergocalciferol, (DRISDOL) 50000 units CAPS capsule Take one capsule weekly For 12 weeks.   No facility-administered encounter medications on file as of 03/08/2018.      SIGNIFICANT DIAGNOSTIC  EXAMS  TODAY:   03-02-18: chest x-ray: No acute disease.  Aortic atherosclerosis.  03-02-18: left ankle x-ray:  1. Soft tissue swelling over the lateral malleolus. Tiny bony density adjacent to the lateral malleolus can not be excluded. This could represent subtle avulsion fracture, age undetermined. No acute abnormality otherwise noted. 2.  Peripheral vascular disease.  03-02-18: left hip and pelvic x-ray: Left subcapital hip fracture with mild displacement. Osteopenia.   LABS REVIEWED TODAY:   03-06-18: wbc 10.8; hgb 9.5; hct 30.3; mcv 93.2; plt 186 03-07-18: glucose 109; bun 19; creat 1.02; k+ 3.9; na++ 138; ca 7.7   Review of Systems  Constitutional: Negative for malaise/fatigue.  Respiratory: Negative for cough and shortness of breath.   Cardiovascular: Negative for chest pain, palpitations and leg swelling.  Gastrointestinal: Negative for abdominal pain, constipation and heartburn.  Musculoskeletal: Positive for joint pain. Negative for back pain and myalgias.       Left hip pain is being managed   Skin: Negative.   Neurological: Negative for dizziness.  Psychiatric/Behavioral: The patient is not nervous/anxious.     Physical Exam Constitutional:      General: She is not in acute distress.    Appearance: She is well-developed. She is not diaphoretic.  Neck:     Musculoskeletal: Neck supple.     Thyroid: No thyromegaly.  Cardiovascular:     Rate and Rhythm: Normal rate and regular rhythm.     Pulses: Normal pulses.     Heart sounds: Normal heart sounds.  Pulmonary:     Effort: Pulmonary effort is normal. No respiratory distress.     Breath sounds: Normal breath sounds.  Abdominal:     General: Bowel sounds are normal. There is no distension.     Palpations: Abdomen is soft.     Tenderness: There is no abdominal tenderness.  Musculoskeletal:     Right lower leg: No edema.     Left lower leg: No edema.     Comments: Is able to move all extremities Is status post  left hip arthroplasty 03-03-18 Kyphosis   Lymphadenopathy:     Cervical: No cervical adenopathy.  Skin:    General: Skin is warm and dry.     Comments: Left hip incision line without signs of infection present   Neurological:     Mental Status: She is alert and oriented to person, place, and time.  Psychiatric:        Mood and Affect: Mood normal.       ASSESSMENT/ PLAN:  TODAY:   1.  Essential hypertension,  benign: is stable will continue lotrel 10/20 mg daily   2. Dyslipidemia: is stable will monitor  3. Anemia of chronic disease: is stable hgb 9.5; will continue iron daily   4. Peripheral neuropathy: is stable will continue neurontin 300 mg three times daily   5. Vit D deficiency: is stable will continue vit D 50,000 units weekly  6. Closed left hip fracture: is stable will continue therapy as directed and will follow up with orthopedics; will continue vicodin 5/325 mg every 6 hours as needed; asa 325 mg twice daily   7. Hypocalcemia: is without change: ca++ 7.7 will begin tums 750 mg three times daily and will monitor   On 03-13-18 will check bmp cbc     MD is aware of resident's narcotic use and is in agreement with current plan of care. We will attempt to wean resident as apropriate   Synthia Innocent NP [facility]  Contact [phone] Monday through Friday 8am- 5pm  After hours call [phone]

## 2018-03-11 ENCOUNTER — Encounter
Admission: RE | Admit: 2018-03-11 | Discharge: 2018-03-11 | Disposition: A | Discharge status: Home / Self Care | Payer: Medicare HMO | Source: Ambulatory Visit | Attending: Internal Medicine | Admitting: Internal Medicine

## 2018-03-13 ENCOUNTER — Other Ambulatory Visit
Admission: RE | Admit: 2018-03-13 | Discharge: 2018-03-13 | Disposition: A | Discharge status: Home / Self Care | Payer: Medicare HMO | Source: Ambulatory Visit | Attending: Adult Health | Admitting: Adult Health

## 2018-03-13 DIAGNOSIS — D638 Anemia in other chronic diseases classified elsewhere: Secondary | ICD-10-CM | POA: Insufficient documentation

## 2018-03-13 DIAGNOSIS — G629 Polyneuropathy, unspecified: Secondary | ICD-10-CM | POA: Insufficient documentation

## 2018-03-13 DIAGNOSIS — N179 Acute kidney failure, unspecified: Secondary | ICD-10-CM | POA: Insufficient documentation

## 2018-03-13 DIAGNOSIS — D62 Acute posthemorrhagic anemia: Secondary | ICD-10-CM | POA: Insufficient documentation

## 2018-03-13 DIAGNOSIS — E559 Vitamin D deficiency, unspecified: Secondary | ICD-10-CM | POA: Insufficient documentation

## 2018-03-13 DIAGNOSIS — E785 Hyperlipidemia, unspecified: Secondary | ICD-10-CM | POA: Insufficient documentation

## 2018-03-13 LAB — BASIC METABOLIC PANEL
Anion gap: 6 (ref 5–15)
BUN: 22 mg/dL (ref 8–23)
CO2: 31 mmol/L (ref 22–32)
Calcium: 9.3 mg/dL (ref 8.9–10.3)
Chloride: 101 mmol/L (ref 98–111)
Creatinine, Ser: 1.09 mg/dL — ABNORMAL HIGH (ref 0.44–1.00)
GFR calc Af Amer: 53 mL/min — ABNORMAL LOW (ref >60)
GFR calc non Af Amer: 46 mL/min — ABNORMAL LOW (ref >60)
Glucose, Bld: 113 mg/dL — ABNORMAL HIGH (ref 70–99)
Potassium: 4 mmol/L (ref 3.5–5.1)
Sodium: 138 mmol/L (ref 135–145)

## 2018-03-13 LAB — CBC WITH DIFFERENTIAL/PLATELET
Abs Immature Granulocytes: 0.09 10*3/uL — ABNORMAL HIGH (ref 0.00–0.07)
Basophils Absolute: 0.1 10*3/uL (ref 0.0–0.1)
Basophils Relative: 0 %
Eosinophils Absolute: 0.1 10*3/uL (ref 0.0–0.5)
Eosinophils Relative: 1 %
HCT: 34.7 % — ABNORMAL LOW (ref 36.0–46.0)
Hemoglobin: 11 g/dL — ABNORMAL LOW (ref 12.0–15.0)
Immature Granulocytes: 1 %
LYMPHS ABS: 1.1 10*3/uL (ref 0.7–4.0)
Lymphocytes Relative: 7 %
MCH: 29.3 pg (ref 26.0–34.0)
MCHC: 31.7 g/dL (ref 30.0–36.0)
MCV: 92.5 fL (ref 80.0–100.0)
MONO ABS: 1.1 10*3/uL — AB (ref 0.1–1.0)
MONOS PCT: 8 %
Neutro Abs: 12.3 10*3/uL — ABNORMAL HIGH (ref 1.7–7.7)
Neutrophils Relative %: 83 %
Platelets: 293 10*3/uL (ref 150–400)
RBC: 3.75 MIL/uL — ABNORMAL LOW (ref 3.87–5.11)
RDW: 14.2 % (ref 11.5–15.5)
WBC: 14.7 10*3/uL — ABNORMAL HIGH (ref 4.0–10.5)
nRBC: 0 % (ref 0.0–0.2)

## 2018-03-15 ENCOUNTER — Non-Acute Institutional Stay (SKILLED_NURSING_FACILITY): Payer: Medicare HMO | Admitting: Adult Health

## 2018-03-15 ENCOUNTER — Encounter: Payer: Self-pay | Admitting: Adult Health

## 2018-03-15 DIAGNOSIS — E559 Vitamin D deficiency, unspecified: Secondary | ICD-10-CM | POA: Diagnosis not present

## 2018-03-15 DIAGNOSIS — S72002S Fracture of unspecified part of neck of left femur, sequela: Secondary | ICD-10-CM

## 2018-03-15 DIAGNOSIS — I10 Essential (primary) hypertension: Secondary | ICD-10-CM | POA: Diagnosis not present

## 2018-03-15 NOTE — Progress Notes (Signed)
[facility]:   [facility] Room Number: 213 A Place of Service:  SNF (31)   [facility]:   [place] [facility] Room Number: 213 A Place of Service:  SNF (31)   CODE STATUS: Full Code  Allergies  Allergen Reactions  . Penicillins     Chief Complaint  Patient presents with  . Medical Management of Chronic Issues    Essential hypertension benign; closed fracture of left hip sequela; vitamin D deficiency; hypocalcemia. Weekly follow up for the first 30 days post hospitalization.     HPI:  She is a 85 year old short term rehab patient being seen for the management of her chronic illnesses: hypertension; left hip fracture; vit d def; hypocalcemia. She denies any uncontrolled left hip fracture; no changes in appetite; no anxiety no insomnia. She has been hospitalized for a left hip fracture. She states that she is doing well with therapy.   Past Medical History:  Diagnosis Date  . Depression   . Glucose intolerance (impaired glucose tolerance)   . Hyperlipidemia   . Hypertension   . Ovarian failure     Past Surgical History:  Procedure Laterality Date  . HIP ARTHROPLASTY Left 03/03/2018   Procedure: ARTHROPLASTY BIPOLAR HIP (HEMIARTHROPLASTY)-LEFT;  Surgeon: Deeann Saint, MD;  Location: [facility] ORS;  Service: Orthopedics;  Laterality: Left;  . NO PAST SURGERIES      Social History   Socioeconomic History  . Marital status: Widowed    Spouse name: Not on file  . Number of children: Not on file  . Years of education: Not on file  . Highest education level: Not on file  Occupational History  . Not on file  Social Needs  . Financial resource strain: Not on file  . Food insecurity:    Worry: Not on file    Inability: Not on file  . Transportation needs:    Medical: Not on file    Non-medical: Not on file  Tobacco Use  . Smoking status: Never Smoker  . Smokeless tobacco: Never Used  Substance and Sexual Activity  . Alcohol use:  No    Alcohol/week: 0.0 standard drinks  . Drug use: No  . Sexual activity: Not on file  Lifestyle  . Physical activity:    Days per week: Not on file    Minutes per session: Not on file  . Stress: Not on file  Relationships  . Social connections:    Talks on phone: Not on file    Gets together: Not on file    Attends religious service: Not on file    Active member of club or organization: Not on file    Attends meetings of clubs or organizations: Not on file    Relationship status: Not on file  . Intimate partner violence:    Fear of current or ex partner: Not on file    Emotionally abused: Not on file    Physically abused: Not on file    Forced sexual activity: Not on file  Other Topics Concern  . Not on file  Social History Narrative  . Not on file   History reviewed. No pertinent family history.    VITAL SIGNS BP (!) 142/53   Pulse 89   Temp 98.1 F (36.7 C)   Resp 18   Ht 5\' 1"  (1.549 m)   Wt 140 lb (63.5 kg)   SpO2 98%   BMI 26.45 kg/m   Outpatient Encounter Medications  as of 03/15/2018  Medication Sig  . acetaminophen (TYLENOL) 325 MG tablet Take 650 mg by mouth every 6 (six) hours as needed.  . Amino Acids-Protein Hydrolys (FEEDING SUPPLEMENT, PRO-STAT SUGAR FREE 64,) LIQD Take 30 mLs by mouth daily.  Marland Kitchen amLODipine-benazepril (LOTREL) 10-20 MG capsule Take 1 capsule by mouth daily.  Marland Kitchen aspirin 325 MG tablet Take 1 tablet (325 mg total) by mouth 2 (two) times daily.  . bisacodyl (BISACODYL) 5 MG EC tablet Take 1 tablet (5 mg total) by mouth daily as needed for moderate constipation.  . calcium carbonate (TUMS - DOSED IN MG ELEMENTAL CALCIUM) 500 MG chewable tablet Chew 1 tablet by mouth 3 (three) times daily.  . [EXPIRED] HYDROcodone-acetaminophen (NORCO/VICODIN) 5-325 MG tablet Take 1 tablet by mouth every 6 (six) hours as needed for up to 7 days for moderate pain.  . Infant Care Products (DERMACLOUD) CREA Apply liberal amount topically to area of skin  irritation two times daily and as needed.  Ok to leave at bedside  . NON FORMULARY Diet Type:  Regular  . Vitamin D, Ergocalciferol, (DRISDOL) 50000 units CAPS capsule Take one capsule weekly For 12 weeks.  . [DISCONTINUED] enoxaparin (LOVENOX) 30 MG/0.3ML injection Inject 0.3 mLs (30 mg total) into the skin daily. (Patient not taking: Reported on 03/15/2018)  . [DISCONTINUED] ferrous sulfate 325 (65 FE) MG tablet Take 1 tablet (325 mg total) by mouth daily with breakfast. (Patient not taking: Reported on 03/15/2018)  . [DISCONTINUED] gabapentin (NEURONTIN) 300 MG capsule Take 1 capsule (300 mg total) by mouth 3 (three) times daily. (Patient not taking: Reported on 03/15/2018)   No facility-administered encounter medications on file as of 03/15/2018.      SIGNIFICANT DIAGNOSTIC EXAMS  PREVIOUS:    03-02-18: chest x-ray: No acute disease.  Aortic atherosclerosis.  03-02-18: left ankle x-ray:  1. Soft tissue swelling over the lateral malleolus. Tiny bony density adjacent to the lateral malleolus can not be excluded. This could represent subtle avulsion fracture, age undetermined. No acute abnormality otherwise noted. 2.  Peripheral vascular disease.  03-02-18: left hip and pelvic x-ray: Left subcapital hip fracture with mild displacement. Osteopenia.  NO NEW EXAMS.    LABS REVIEWED PREVIOUS:   03-06-18: wbc 10.8; hgb 9.5; hct 30.3; mcv 93.2; plt 186 03-07-18: glucose 109; bun 19; creat 1.02; k+ 3.9; na++ 138; ca 7.7   TODAY:   03-13-18: wbc 14.7; hgb 11.0; hct 34.7; mcv 92.5; plt 293; glucose 113; bun 22; creat 1.09; k+ 4.0; na++ 138; ca 9.3    Review of Systems  Constitutional: Negative for malaise/fatigue.  Respiratory: Negative for cough and shortness of breath.   Cardiovascular: Negative for chest pain, palpitations and leg swelling.  Gastrointestinal: Negative for abdominal pain, constipation and heartburn.  Musculoskeletal: Positive for joint pain. Negative for back pain and myalgias.         Left hip pain is managed   Skin: Negative.   Neurological: Negative for dizziness.  Psychiatric/Behavioral: The patient is not nervous/anxious.     Physical Exam Constitutional:      General: She is not in acute distress.    Appearance: She is well-developed. She is not diaphoretic.  Neck:     Musculoskeletal: Neck supple.     Thyroid: No thyromegaly.  Cardiovascular:     Rate and Rhythm: Normal rate and regular rhythm.     Pulses: Normal pulses.     Heart sounds: Normal heart sounds.  Pulmonary:     Effort: Pulmonary effort  is normal. No respiratory distress.     Breath sounds: Normal breath sounds.  Abdominal:     General: Bowel sounds are normal. There is no distension.     Palpations: Abdomen is soft.     Tenderness: There is no abdominal tenderness.  Musculoskeletal:     Right lower leg: No edema.     Left lower leg: No edema.     Comments: Is able to move all extremities Is status post left hip arthroplasty 03-03-18 Kyphosis    Lymphadenopathy:     Cervical: No cervical adenopathy.  Skin:    General: Skin is warm and dry.     Comments:  Left hip incision line without signs of infection present    Neurological:     Mental Status: She is alert and oriented to person, place, and time.  Psychiatric:        Mood and Affect: Mood normal.     ASSESSMENT/ PLAN:  TODAY:   1. Vit D deficiency: is stable will continue vit D 50,000 units weekly  2. Closed left hip fracture: is stable will continue therapy as directed and will follow up with orthopedics; will continue vicodin 5/325 mg every 6 hours as needed; asa 325 mg twice daily  Has completed lovenox therapy   3. Hypocalcemia: is stable ca 9.3  will continue  tums 750 mg three times daily and will monitor   4.  Essential hypertension, benign: b/p 142/53 is stable will continue lotrel 10/20 mg daily   PREVIOUS   5. Dyslipidemia: is stable will monitor  6. Anemia of chronic disease: is stable hgb 11.0 iron  has been stopped   7. Peripheral neuropathy: is stable will neurontin stopped will monitor    Will repeat cbc   MD is aware of resident's narcotic use and is in agreement with current plan of care. We will attempt to wean resident as apropriate   Synthia Innocenteborah Raford Brissett NP [facility]  Contact [phone] Monday through Friday 8am- 5pm  After hours call [phone]

## 2018-03-16 ENCOUNTER — Other Ambulatory Visit
Admission: RE | Admit: 2018-03-16 | Discharge: 2018-03-16 | Disposition: A | Discharge status: Home / Self Care | Payer: Medicare HMO | Source: Ambulatory Visit | Attending: Adult Health | Admitting: Adult Health

## 2018-03-16 DIAGNOSIS — D649 Anemia, unspecified: Secondary | ICD-10-CM | POA: Insufficient documentation

## 2018-03-16 DIAGNOSIS — Z96642 Presence of left artificial hip joint: Secondary | ICD-10-CM | POA: Diagnosis not present

## 2018-03-16 LAB — CBC WITH DIFFERENTIAL/PLATELET
Abs Immature Granulocytes: 0.04 10*3/uL (ref 0.00–0.07)
Basophils Absolute: 0.1 10*3/uL (ref 0.0–0.1)
Basophils Relative: 1 %
Eosinophils Absolute: 0.1 10*3/uL (ref 0.0–0.5)
Eosinophils Relative: 1 %
HCT: 35.7 % — ABNORMAL LOW (ref 36.0–46.0)
Hemoglobin: 11.2 g/dL — ABNORMAL LOW (ref 12.0–15.0)
Immature Granulocytes: 0 %
Lymphocytes Relative: 11 %
Lymphs Abs: 1.1 10*3/uL (ref 0.7–4.0)
MCH: 29 pg (ref 26.0–34.0)
MCHC: 31.4 g/dL (ref 30.0–36.0)
MCV: 92.5 fL (ref 80.0–100.0)
Monocytes Absolute: 0.8 10*3/uL (ref 0.1–1.0)
Monocytes Relative: 8 %
Neutro Abs: 7.7 10*3/uL (ref 1.7–7.7)
Neutrophils Relative %: 79 %
Platelets: 331 10*3/uL (ref 150–400)
RBC: 3.86 MIL/uL — ABNORMAL LOW (ref 3.87–5.11)
RDW: 13.9 % (ref 11.5–15.5)
WBC: 9.8 10*3/uL (ref 4.0–10.5)
nRBC: 0 % (ref 0.0–0.2)

## 2018-03-20 ENCOUNTER — Encounter: Payer: Self-pay | Admitting: Adult Health

## 2018-03-20 ENCOUNTER — Other Ambulatory Visit: Payer: Self-pay | Admitting: Adult Health

## 2018-03-20 ENCOUNTER — Non-Acute Institutional Stay (SKILLED_NURSING_FACILITY): Payer: Medicare HMO | Admitting: Adult Health

## 2018-03-20 DIAGNOSIS — G6289 Other specified polyneuropathies: Secondary | ICD-10-CM

## 2018-03-20 DIAGNOSIS — I10 Essential (primary) hypertension: Secondary | ICD-10-CM

## 2018-03-20 DIAGNOSIS — D638 Anemia in other chronic diseases classified elsewhere: Secondary | ICD-10-CM

## 2018-03-20 DIAGNOSIS — S72002S Fracture of unspecified part of neck of left femur, sequela: Secondary | ICD-10-CM

## 2018-03-20 MED ORDER — AMLODIPINE BESY-BENAZEPRIL HCL 10-20 MG PO CAPS: 1.0000 | ORAL_CAPSULE | Freq: Every day | ORAL | 0 refills | Status: DC

## 2018-03-20 NOTE — Progress Notes (Signed)
Location:   [facility] Room Number: 213 A Place of Service:  SNF (31)    CODE STATUS: Full Code  Allergies  Allergen Reactions  . Penicillins     Chief Complaint  Patient presents with  . Discharge Note    Discharging to home on 03/21/2018    HPI:  She is being discharged to home with home health for pt/ot. She will need a 3:1 commode and a front wheel walker. She will need her prescriptions written and will need to follow up with her medical provider.  She had been hospitalized for a left hip fracture. She was admitted to this facility for short term rehab. She has completed her SNF therapy and is now ready for discharge to home.    Past Medical History:  Diagnosis Date  . Depression   . Glucose intolerance (impaired glucose tolerance)   . Hyperlipidemia   . Hypertension   . Ovarian failure     Past Surgical History:  Procedure Laterality Date  . HIP ARTHROPLASTY Left 03/03/2018   Procedure: ARTHROPLASTY BIPOLAR HIP (HEMIARTHROPLASTY)-LEFT;  Surgeon: Deeann Saint, MD;  Location: [facility] ORS;  Service: Orthopedics;  Laterality: Left;  . NO PAST SURGERIES      Social History   Socioeconomic History  . Marital status: Widowed    Spouse name: Not on file  . Number of children: Not on file  . Years of education: Not on file  . Highest education level: Not on file  Occupational History  . Not on file  Social Needs  . Financial resource strain: Not on file  . Food insecurity:    Worry: Not on file    Inability: Not on file  . Transportation needs:    Medical: Not on file    Non-medical: Not on file  Tobacco Use  . Smoking status: Never Smoker  . Smokeless tobacco: Never Used  Substance and Sexual Activity  . Alcohol use: No    Alcohol/week: 0.0 standard drinks  . Drug use: No  . Sexual activity: Not on file  Lifestyle  . Physical activity:    Days per week: Not on file    Minutes per session: Not on file  . Stress: Not on file   Relationships  . Social connections:    Talks on phone: Not on file    Gets together: Not on file    Attends religious service: Not on file    Active member of club or organization: Not on file    Attends meetings of clubs or organizations: Not on file    Relationship status: Not on file  . Intimate partner violence:    Fear of current or ex partner: Not on file    Emotionally abused: Not on file    Physically abused: Not on file    Forced sexual activity: Not on file  Other Topics Concern  . Not on file  Social History Narrative  . Not on file   History reviewed. No pertinent family history.  VITAL SIGNS BP (!) 133/52   Pulse 77   Temp 98.1 F (36.7 C)   Resp 18   Ht 5\' 1"  (1.549 m)   Wt 140 lb (63.5 kg)   SpO2 98%   BMI 26.45 kg/m   Patient's Medications  New Prescriptions   No medications on file  Previous Medications   ACETAMINOPHEN (TYLENOL) 325 MG TABLET    Take 650 mg by mouth every 6 (six) hours as  needed.   AMINO ACIDS-PROTEIN HYDROLYS (FEEDING SUPPLEMENT, PRO-STAT SUGAR FREE 64,) LIQD    Take 30 mLs by mouth daily.   AMLODIPINE-BENAZEPRIL (LOTREL) 10-20 MG CAPSULE    Take 1 capsule by mouth daily.   ASPIRIN 325 MG TABLET    Take 1 tablet (325 mg total) by mouth 2 (two) times daily.   BISACODYL (BISACODYL) 5 MG EC TABLET    Take 1 tablet (5 mg total) by mouth daily as needed for moderate constipation.   CALCIUM CARBONATE (TUMS - DOSED IN MG ELEMENTAL CALCIUM) 500 MG CHEWABLE TABLET    Chew 1 tablet by mouth 3 (three) times daily.   INFANT CARE PRODUCTS (DERMACLOUD) CREA    Apply liberal amount topically to area of skin irritation two times daily and as needed.  Ok to leave at bedside   NON FORMULARY    Diet Type:  Regular   VITAMIN D, ERGOCALCIFEROL, (DRISDOL) 50000 UNITS CAPS CAPSULE    Take one capsule weekly For 12 weeks.  Modified Medications   No medications on file  Discontinued Medications   No medications on file     SIGNIFICANT DIAGNOSTIC  EXAMS  PREVIOUS:    03-02-18: chest x-ray: No acute disease.  Aortic atherosclerosis.  03-02-18: left ankle x-ray:  1. Soft tissue swelling over the lateral malleolus. Tiny bony density adjacent to the lateral malleolus can not be excluded. This could represent subtle avulsion fracture, age undetermined. No acute abnormality otherwise noted. 2.  Peripheral vascular disease.  03-02-18: left hip and pelvic x-ray: Left subcapital hip fracture with mild displacement. Osteopenia.  NO NEW EXAMS.    LABS REVIEWED PREVIOUS:   03-06-18: wbc 10.8; hgb 9.5; hct 30.3; mcv 93.2; plt 186 03-07-18: glucose 109; bun 19; creat 1.02; k+ 3.9; na++ 138; ca 7.7  03-13-18: wbc 14.7; hgb 11.0; hct 34.7; mcv 92.5; plt 293; glucose 113; bun 22; creat 1.09; k+ 4.0; na++ 138; ca 9.3   NO NEW LABS.    Review of Systems  Constitutional: Negative for malaise/fatigue.  Respiratory: Negative for cough and shortness of breath.   Cardiovascular: Negative for chest pain, palpitations and leg swelling.  Gastrointestinal: Negative for abdominal pain, constipation and heartburn.  Musculoskeletal: Negative for back pain, joint pain and myalgias.  Skin: Negative.   Neurological: Negative for dizziness.  Psychiatric/Behavioral: The patient is not nervous/anxious.     Physical Exam Constitutional:      General: She is not in acute distress.    Appearance: She is well-developed. She is not diaphoretic.  Neck:     Musculoskeletal: Neck supple.     Thyroid: No thyromegaly.  Cardiovascular:     Rate and Rhythm: Normal rate and regular rhythm.     Pulses: Normal pulses.     Heart sounds: Normal heart sounds.  Pulmonary:     Effort: Pulmonary effort is normal. No respiratory distress.     Breath sounds: Normal breath sounds.  Abdominal:     General: Bowel sounds are normal. There is no distension.     Palpations: Abdomen is soft.     Tenderness: There is no abdominal tenderness.  Musculoskeletal:     Right lower  leg: No edema.     Left lower leg: No edema.     Comments: Is able to move all extremities Is status post left hip arthroplasty 03-03-18 Kyphosis     Lymphadenopathy:     Cervical: No cervical adenopathy.  Skin:    General: Skin is warm and dry.  Comments: Left hip incision line without signs of infection present  Neurological:     Mental Status: She is alert and oriented to person, place, and time.  Psychiatric:        Mood and Affect: Mood normal.      ASSESSMENT/ PLAN:   Patient is being discharged with the following home health services:  Pt/ot: to evaluate and treat as indicted for gait balance strength adl training   Patient is being discharged with the following durable medical equipment:  3:1 commode; front wheel walker to allow her maintain her current level independence with her adls.    Patient has been advised to f/u with their PCP in 1-2 weeks to bring them up to date on their rehab stay.  Social services at facility was responsible for arranging this appointment.  Pt was provided with a 30 day supply of prescriptions for medications and refills must be obtained from their PCP.  For controlled substances, a more limited supply may be provided adequate until PCP appointment only.  [facility] 30 day [facility] [address]   Time spent with patient: 40 minutes: discussed medications; dme; home health needs; verbalized understanding.    Synthia Innocenteborah  NP [facility]  Contact [phone] Monday through Friday 8am- 5pm  After hours call [phone]

## 2018-03-22 ENCOUNTER — Other Ambulatory Visit: Payer: Self-pay

## 2018-03-22 NOTE — Patient Outreach (Signed)
[facility]) Care Management  03/22/2018  Diane Rogers [birth year] [id number]     Transition of Care Referral  Referral Date: 03/22/2018  Referral Source: [facility] Discharge Report Date of Admission: unknown Diagnosis: "fracture of left femur s/p repiar" Date of Discharge: 03/20/2018 Facility: [facility] Insurance: [facility]    Outreach attempt # 1 to patient. Spoke briefly with both patient and her daughter-Diane Rogers. Patient states that everything is going fine since she returned home. She reports she "had a smooth transition" and realizes she is "one of the lucky ones." She denies any acute issues or concerns at this time. Patient saw MD prior to discharge from facility and is to follow up with PCP within a month. She denies any issues with transportation. RN CM confirmed with patient that she has all her meds and no issues or concerns regarding them. She voices that her daughter is supportive and assisting her as needed. Daughter states that patient was ordered HHPT/OT at time of discharge. They have not heard anything from agency but was advised it would take about 48 hrs. RN CM confirmed with daughter that she has agency name and contact info. She will follow up with agency if she does not get a call today. They both deny any further RN CM needs or concerns at this time. Patient appreciative of follow up call.    Plan: RN CM will close case as no further interventions needed at this time.    Antionette Fairy, RN,BSN,CCM [facility] Care Management Telephonic Care Management Coordinator Direct Phone: [phone] Toll Free: [phone] Fax: [phone]

## 2018-03-24 DIAGNOSIS — G629 Polyneuropathy, unspecified: Secondary | ICD-10-CM | POA: Diagnosis not present

## 2018-03-24 DIAGNOSIS — M80052D Age-related osteoporosis with current pathological fracture, left femur, subsequent encounter for fracture with routine healing: Secondary | ICD-10-CM | POA: Diagnosis not present

## 2018-03-24 DIAGNOSIS — D649 Anemia, unspecified: Secondary | ICD-10-CM | POA: Diagnosis not present

## 2018-03-24 DIAGNOSIS — E559 Vitamin D deficiency, unspecified: Secondary | ICD-10-CM | POA: Diagnosis not present

## 2018-03-24 DIAGNOSIS — E785 Hyperlipidemia, unspecified: Secondary | ICD-10-CM | POA: Diagnosis not present

## 2018-03-24 DIAGNOSIS — I10 Essential (primary) hypertension: Secondary | ICD-10-CM | POA: Diagnosis not present

## 2018-03-24 DIAGNOSIS — F329 Major depressive disorder, single episode, unspecified: Secondary | ICD-10-CM | POA: Diagnosis not present

## 2018-03-24 DIAGNOSIS — I739 Peripheral vascular disease, unspecified: Secondary | ICD-10-CM | POA: Diagnosis not present

## 2018-03-27 DIAGNOSIS — G629 Polyneuropathy, unspecified: Secondary | ICD-10-CM | POA: Diagnosis not present

## 2018-03-27 DIAGNOSIS — I10 Essential (primary) hypertension: Secondary | ICD-10-CM | POA: Diagnosis not present

## 2018-03-27 DIAGNOSIS — I739 Peripheral vascular disease, unspecified: Secondary | ICD-10-CM | POA: Diagnosis not present

## 2018-03-27 DIAGNOSIS — E559 Vitamin D deficiency, unspecified: Secondary | ICD-10-CM | POA: Diagnosis not present

## 2018-03-27 DIAGNOSIS — D649 Anemia, unspecified: Secondary | ICD-10-CM | POA: Diagnosis not present

## 2018-03-27 DIAGNOSIS — M80052D Age-related osteoporosis with current pathological fracture, left femur, subsequent encounter for fracture with routine healing: Secondary | ICD-10-CM | POA: Diagnosis not present

## 2018-03-27 DIAGNOSIS — F329 Major depressive disorder, single episode, unspecified: Secondary | ICD-10-CM | POA: Diagnosis not present

## 2018-03-27 DIAGNOSIS — E785 Hyperlipidemia, unspecified: Secondary | ICD-10-CM | POA: Diagnosis not present

## 2018-03-29 DIAGNOSIS — I739 Peripheral vascular disease, unspecified: Secondary | ICD-10-CM | POA: Diagnosis not present

## 2018-03-29 DIAGNOSIS — F329 Major depressive disorder, single episode, unspecified: Secondary | ICD-10-CM | POA: Diagnosis not present

## 2018-03-29 DIAGNOSIS — E785 Hyperlipidemia, unspecified: Secondary | ICD-10-CM | POA: Diagnosis not present

## 2018-03-29 DIAGNOSIS — D649 Anemia, unspecified: Secondary | ICD-10-CM | POA: Diagnosis not present

## 2018-03-29 DIAGNOSIS — I10 Essential (primary) hypertension: Secondary | ICD-10-CM | POA: Diagnosis not present

## 2018-03-29 DIAGNOSIS — M80052D Age-related osteoporosis with current pathological fracture, left femur, subsequent encounter for fracture with routine healing: Secondary | ICD-10-CM | POA: Diagnosis not present

## 2018-03-29 DIAGNOSIS — G629 Polyneuropathy, unspecified: Secondary | ICD-10-CM | POA: Diagnosis not present

## 2018-03-29 DIAGNOSIS — E559 Vitamin D deficiency, unspecified: Secondary | ICD-10-CM | POA: Diagnosis not present

## 2018-03-30 DIAGNOSIS — D649 Anemia, unspecified: Secondary | ICD-10-CM | POA: Diagnosis not present

## 2018-03-30 DIAGNOSIS — I739 Peripheral vascular disease, unspecified: Secondary | ICD-10-CM | POA: Diagnosis not present

## 2018-03-30 DIAGNOSIS — F329 Major depressive disorder, single episode, unspecified: Secondary | ICD-10-CM | POA: Diagnosis not present

## 2018-03-30 DIAGNOSIS — E559 Vitamin D deficiency, unspecified: Secondary | ICD-10-CM | POA: Diagnosis not present

## 2018-03-30 DIAGNOSIS — M80052D Age-related osteoporosis with current pathological fracture, left femur, subsequent encounter for fracture with routine healing: Secondary | ICD-10-CM | POA: Diagnosis not present

## 2018-03-30 DIAGNOSIS — I10 Essential (primary) hypertension: Secondary | ICD-10-CM | POA: Diagnosis not present

## 2018-03-30 DIAGNOSIS — E785 Hyperlipidemia, unspecified: Secondary | ICD-10-CM | POA: Diagnosis not present

## 2018-03-30 DIAGNOSIS — G629 Polyneuropathy, unspecified: Secondary | ICD-10-CM | POA: Diagnosis not present

## 2018-04-04 DIAGNOSIS — E559 Vitamin D deficiency, unspecified: Secondary | ICD-10-CM | POA: Diagnosis not present

## 2018-04-04 DIAGNOSIS — I739 Peripheral vascular disease, unspecified: Secondary | ICD-10-CM | POA: Diagnosis not present

## 2018-04-04 DIAGNOSIS — F329 Major depressive disorder, single episode, unspecified: Secondary | ICD-10-CM | POA: Diagnosis not present

## 2018-04-04 DIAGNOSIS — E785 Hyperlipidemia, unspecified: Secondary | ICD-10-CM | POA: Diagnosis not present

## 2018-04-04 DIAGNOSIS — G629 Polyneuropathy, unspecified: Secondary | ICD-10-CM | POA: Diagnosis not present

## 2018-04-04 DIAGNOSIS — M80052D Age-related osteoporosis with current pathological fracture, left femur, subsequent encounter for fracture with routine healing: Secondary | ICD-10-CM | POA: Diagnosis not present

## 2018-04-04 DIAGNOSIS — D649 Anemia, unspecified: Secondary | ICD-10-CM | POA: Diagnosis not present

## 2018-04-04 DIAGNOSIS — I10 Essential (primary) hypertension: Secondary | ICD-10-CM | POA: Diagnosis not present

## 2018-04-06 DIAGNOSIS — G629 Polyneuropathy, unspecified: Secondary | ICD-10-CM | POA: Diagnosis not present

## 2018-04-06 DIAGNOSIS — E559 Vitamin D deficiency, unspecified: Secondary | ICD-10-CM | POA: Diagnosis not present

## 2018-04-06 DIAGNOSIS — F329 Major depressive disorder, single episode, unspecified: Secondary | ICD-10-CM | POA: Diagnosis not present

## 2018-04-06 DIAGNOSIS — D649 Anemia, unspecified: Secondary | ICD-10-CM | POA: Diagnosis not present

## 2018-04-06 DIAGNOSIS — I10 Essential (primary) hypertension: Secondary | ICD-10-CM | POA: Diagnosis not present

## 2018-04-06 DIAGNOSIS — M80052D Age-related osteoporosis with current pathological fracture, left femur, subsequent encounter for fracture with routine healing: Secondary | ICD-10-CM | POA: Diagnosis not present

## 2018-04-06 DIAGNOSIS — E785 Hyperlipidemia, unspecified: Secondary | ICD-10-CM | POA: Diagnosis not present

## 2018-04-06 DIAGNOSIS — I739 Peripheral vascular disease, unspecified: Secondary | ICD-10-CM | POA: Diagnosis not present

## 2018-04-11 DIAGNOSIS — D649 Anemia, unspecified: Secondary | ICD-10-CM | POA: Diagnosis not present

## 2018-04-11 DIAGNOSIS — E785 Hyperlipidemia, unspecified: Secondary | ICD-10-CM | POA: Diagnosis not present

## 2018-04-11 DIAGNOSIS — M80052D Age-related osteoporosis with current pathological fracture, left femur, subsequent encounter for fracture with routine healing: Secondary | ICD-10-CM | POA: Diagnosis not present

## 2018-04-11 DIAGNOSIS — F329 Major depressive disorder, single episode, unspecified: Secondary | ICD-10-CM | POA: Diagnosis not present

## 2018-04-11 DIAGNOSIS — G629 Polyneuropathy, unspecified: Secondary | ICD-10-CM | POA: Diagnosis not present

## 2018-04-11 DIAGNOSIS — E559 Vitamin D deficiency, unspecified: Secondary | ICD-10-CM | POA: Diagnosis not present

## 2018-04-11 DIAGNOSIS — I10 Essential (primary) hypertension: Secondary | ICD-10-CM | POA: Diagnosis not present

## 2018-04-11 DIAGNOSIS — I739 Peripheral vascular disease, unspecified: Secondary | ICD-10-CM | POA: Diagnosis not present

## 2018-04-12 DIAGNOSIS — Z96642 Presence of left artificial hip joint: Secondary | ICD-10-CM | POA: Diagnosis not present

## 2018-04-13 DIAGNOSIS — F329 Major depressive disorder, single episode, unspecified: Secondary | ICD-10-CM | POA: Diagnosis not present

## 2018-04-13 DIAGNOSIS — M80052D Age-related osteoporosis with current pathological fracture, left femur, subsequent encounter for fracture with routine healing: Secondary | ICD-10-CM | POA: Diagnosis not present

## 2018-04-13 DIAGNOSIS — I739 Peripheral vascular disease, unspecified: Secondary | ICD-10-CM | POA: Diagnosis not present

## 2018-04-13 DIAGNOSIS — G629 Polyneuropathy, unspecified: Secondary | ICD-10-CM | POA: Diagnosis not present

## 2018-04-13 DIAGNOSIS — E785 Hyperlipidemia, unspecified: Secondary | ICD-10-CM | POA: Diagnosis not present

## 2018-04-13 DIAGNOSIS — E559 Vitamin D deficiency, unspecified: Secondary | ICD-10-CM | POA: Diagnosis not present

## 2018-04-13 DIAGNOSIS — D649 Anemia, unspecified: Secondary | ICD-10-CM | POA: Diagnosis not present

## 2018-04-13 DIAGNOSIS — I10 Essential (primary) hypertension: Secondary | ICD-10-CM | POA: Diagnosis not present

## 2018-04-18 DIAGNOSIS — E559 Vitamin D deficiency, unspecified: Secondary | ICD-10-CM | POA: Diagnosis not present

## 2018-04-18 DIAGNOSIS — F329 Major depressive disorder, single episode, unspecified: Secondary | ICD-10-CM | POA: Diagnosis not present

## 2018-04-18 DIAGNOSIS — M80052D Age-related osteoporosis with current pathological fracture, left femur, subsequent encounter for fracture with routine healing: Secondary | ICD-10-CM | POA: Diagnosis not present

## 2018-04-18 DIAGNOSIS — I739 Peripheral vascular disease, unspecified: Secondary | ICD-10-CM | POA: Diagnosis not present

## 2018-04-18 DIAGNOSIS — D649 Anemia, unspecified: Secondary | ICD-10-CM | POA: Diagnosis not present

## 2018-04-18 DIAGNOSIS — G629 Polyneuropathy, unspecified: Secondary | ICD-10-CM | POA: Diagnosis not present

## 2018-04-18 DIAGNOSIS — E785 Hyperlipidemia, unspecified: Secondary | ICD-10-CM | POA: Diagnosis not present

## 2018-04-18 DIAGNOSIS — I10 Essential (primary) hypertension: Secondary | ICD-10-CM | POA: Diagnosis not present

## 2018-04-20 DIAGNOSIS — E785 Hyperlipidemia, unspecified: Secondary | ICD-10-CM | POA: Diagnosis not present

## 2018-04-20 DIAGNOSIS — I10 Essential (primary) hypertension: Secondary | ICD-10-CM | POA: Diagnosis not present

## 2018-04-20 DIAGNOSIS — D649 Anemia, unspecified: Secondary | ICD-10-CM | POA: Diagnosis not present

## 2018-04-20 DIAGNOSIS — I739 Peripheral vascular disease, unspecified: Secondary | ICD-10-CM | POA: Diagnosis not present

## 2018-04-20 DIAGNOSIS — E559 Vitamin D deficiency, unspecified: Secondary | ICD-10-CM | POA: Diagnosis not present

## 2018-04-20 DIAGNOSIS — G629 Polyneuropathy, unspecified: Secondary | ICD-10-CM | POA: Diagnosis not present

## 2018-04-20 DIAGNOSIS — F329 Major depressive disorder, single episode, unspecified: Secondary | ICD-10-CM | POA: Diagnosis not present

## 2018-04-20 DIAGNOSIS — M80052D Age-related osteoporosis with current pathological fracture, left femur, subsequent encounter for fracture with routine healing: Secondary | ICD-10-CM | POA: Diagnosis not present

## 2018-04-25 DIAGNOSIS — M80052D Age-related osteoporosis with current pathological fracture, left femur, subsequent encounter for fracture with routine healing: Secondary | ICD-10-CM | POA: Diagnosis not present

## 2018-04-25 DIAGNOSIS — E785 Hyperlipidemia, unspecified: Secondary | ICD-10-CM | POA: Diagnosis not present

## 2018-04-25 DIAGNOSIS — I10 Essential (primary) hypertension: Secondary | ICD-10-CM | POA: Diagnosis not present

## 2018-04-25 DIAGNOSIS — G629 Polyneuropathy, unspecified: Secondary | ICD-10-CM | POA: Diagnosis not present

## 2018-04-25 DIAGNOSIS — F329 Major depressive disorder, single episode, unspecified: Secondary | ICD-10-CM | POA: Diagnosis not present

## 2018-04-25 DIAGNOSIS — D649 Anemia, unspecified: Secondary | ICD-10-CM | POA: Diagnosis not present

## 2018-04-25 DIAGNOSIS — E559 Vitamin D deficiency, unspecified: Secondary | ICD-10-CM | POA: Diagnosis not present

## 2018-04-25 DIAGNOSIS — I739 Peripheral vascular disease, unspecified: Secondary | ICD-10-CM | POA: Diagnosis not present

## 2018-04-27 DIAGNOSIS — M80052D Age-related osteoporosis with current pathological fracture, left femur, subsequent encounter for fracture with routine healing: Secondary | ICD-10-CM | POA: Diagnosis not present

## 2018-04-27 DIAGNOSIS — I739 Peripheral vascular disease, unspecified: Secondary | ICD-10-CM | POA: Diagnosis not present

## 2018-04-27 DIAGNOSIS — G629 Polyneuropathy, unspecified: Secondary | ICD-10-CM | POA: Diagnosis not present

## 2018-04-27 DIAGNOSIS — F329 Major depressive disorder, single episode, unspecified: Secondary | ICD-10-CM | POA: Diagnosis not present

## 2018-04-27 DIAGNOSIS — E559 Vitamin D deficiency, unspecified: Secondary | ICD-10-CM | POA: Diagnosis not present

## 2018-04-27 DIAGNOSIS — D649 Anemia, unspecified: Secondary | ICD-10-CM | POA: Diagnosis not present

## 2018-04-27 DIAGNOSIS — I10 Essential (primary) hypertension: Secondary | ICD-10-CM | POA: Diagnosis not present

## 2018-04-27 DIAGNOSIS — E785 Hyperlipidemia, unspecified: Secondary | ICD-10-CM | POA: Diagnosis not present

## 2018-05-02 DIAGNOSIS — I739 Peripheral vascular disease, unspecified: Secondary | ICD-10-CM | POA: Diagnosis not present

## 2018-05-02 DIAGNOSIS — M80052D Age-related osteoporosis with current pathological fracture, left femur, subsequent encounter for fracture with routine healing: Secondary | ICD-10-CM | POA: Diagnosis not present

## 2018-05-02 DIAGNOSIS — F329 Major depressive disorder, single episode, unspecified: Secondary | ICD-10-CM | POA: Diagnosis not present

## 2018-05-02 DIAGNOSIS — I10 Essential (primary) hypertension: Secondary | ICD-10-CM | POA: Diagnosis not present

## 2018-05-02 DIAGNOSIS — G629 Polyneuropathy, unspecified: Secondary | ICD-10-CM | POA: Diagnosis not present

## 2018-05-02 DIAGNOSIS — E785 Hyperlipidemia, unspecified: Secondary | ICD-10-CM | POA: Diagnosis not present

## 2018-05-02 DIAGNOSIS — D649 Anemia, unspecified: Secondary | ICD-10-CM | POA: Diagnosis not present

## 2018-05-02 DIAGNOSIS — E559 Vitamin D deficiency, unspecified: Secondary | ICD-10-CM | POA: Diagnosis not present

## 2018-05-04 DIAGNOSIS — E785 Hyperlipidemia, unspecified: Secondary | ICD-10-CM | POA: Diagnosis not present

## 2018-05-04 DIAGNOSIS — I739 Peripheral vascular disease, unspecified: Secondary | ICD-10-CM | POA: Diagnosis not present

## 2018-05-04 DIAGNOSIS — G629 Polyneuropathy, unspecified: Secondary | ICD-10-CM | POA: Diagnosis not present

## 2018-05-04 DIAGNOSIS — I10 Essential (primary) hypertension: Secondary | ICD-10-CM | POA: Diagnosis not present

## 2018-05-04 DIAGNOSIS — F329 Major depressive disorder, single episode, unspecified: Secondary | ICD-10-CM | POA: Diagnosis not present

## 2018-05-04 DIAGNOSIS — D649 Anemia, unspecified: Secondary | ICD-10-CM | POA: Diagnosis not present

## 2018-05-04 DIAGNOSIS — M80052D Age-related osteoporosis with current pathological fracture, left femur, subsequent encounter for fracture with routine healing: Secondary | ICD-10-CM | POA: Diagnosis not present

## 2018-05-04 DIAGNOSIS — E559 Vitamin D deficiency, unspecified: Secondary | ICD-10-CM | POA: Diagnosis not present

## 2018-05-10 ENCOUNTER — Ambulatory Visit: Payer: Medicare HMO | Admitting: Family Medicine

## 2020-04-07 ENCOUNTER — Emergency Department: Payer: Medicare HMO

## 2020-04-07 ENCOUNTER — Encounter: Payer: Self-pay | Admitting: Internal Medicine

## 2020-04-07 ENCOUNTER — Other Ambulatory Visit: Payer: Self-pay

## 2020-04-07 ENCOUNTER — Inpatient Hospital Stay
Admission: EM | Admit: 2020-04-07 | Discharge: 2020-04-14 | DRG: 522 | Disposition: A | Discharge status: Home Health | Payer: Medicare HMO | Attending: Internal Medicine | Admitting: Internal Medicine

## 2020-04-07 DIAGNOSIS — D62 Acute posthemorrhagic anemia: Secondary | ICD-10-CM | POA: Diagnosis not present

## 2020-04-07 DIAGNOSIS — Z96641 Presence of right artificial hip joint: Secondary | ICD-10-CM | POA: Diagnosis not present

## 2020-04-07 DIAGNOSIS — D631 Anemia in chronic kidney disease: Secondary | ICD-10-CM | POA: Diagnosis present

## 2020-04-07 DIAGNOSIS — R5381 Other malaise: Secondary | ICD-10-CM | POA: Diagnosis not present

## 2020-04-07 DIAGNOSIS — Z880 Allergy status to penicillin: Secondary | ICD-10-CM | POA: Diagnosis not present

## 2020-04-07 DIAGNOSIS — Z91138 Patient's unintentional underdosing of medication regimen for other reason: Secondary | ICD-10-CM

## 2020-04-07 DIAGNOSIS — S72001A Fracture of unspecified part of neck of right femur, initial encounter for closed fracture: Secondary | ICD-10-CM | POA: Diagnosis present

## 2020-04-07 DIAGNOSIS — S72001D Fracture of unspecified part of neck of right femur, subsequent encounter for closed fracture with routine healing: Secondary | ICD-10-CM | POA: Diagnosis not present

## 2020-04-07 DIAGNOSIS — T461X6A Underdosing of calcium-channel blockers, initial encounter: Secondary | ICD-10-CM | POA: Diagnosis present

## 2020-04-07 DIAGNOSIS — E785 Hyperlipidemia, unspecified: Secondary | ICD-10-CM | POA: Diagnosis present

## 2020-04-07 DIAGNOSIS — N1831 Chronic kidney disease, stage 3a: Secondary | ICD-10-CM | POA: Diagnosis present

## 2020-04-07 DIAGNOSIS — S72011A Unspecified intracapsular fracture of right femur, initial encounter for closed fracture: Principal | ICD-10-CM | POA: Diagnosis present

## 2020-04-07 DIAGNOSIS — R279 Unspecified lack of coordination: Secondary | ICD-10-CM | POA: Diagnosis not present

## 2020-04-07 DIAGNOSIS — D638 Anemia in other chronic diseases classified elsewhere: Secondary | ICD-10-CM | POA: Diagnosis not present

## 2020-04-07 DIAGNOSIS — R52 Pain, unspecified: Secondary | ICD-10-CM

## 2020-04-07 DIAGNOSIS — R8290 Unspecified abnormal findings in urine: Secondary | ICD-10-CM | POA: Diagnosis present

## 2020-04-07 DIAGNOSIS — R600 Localized edema: Secondary | ICD-10-CM | POA: Diagnosis present

## 2020-04-07 DIAGNOSIS — W19XXXA Unspecified fall, initial encounter: Secondary | ICD-10-CM

## 2020-04-07 DIAGNOSIS — Z471 Aftercare following joint replacement surgery: Secondary | ICD-10-CM | POA: Diagnosis not present

## 2020-04-07 DIAGNOSIS — E559 Vitamin D deficiency, unspecified: Secondary | ICD-10-CM | POA: Diagnosis present

## 2020-04-07 DIAGNOSIS — I10 Essential (primary) hypertension: Secondary | ICD-10-CM | POA: Diagnosis present

## 2020-04-07 DIAGNOSIS — S72041A Displaced fracture of base of neck of right femur, initial encounter for closed fracture: Secondary | ICD-10-CM | POA: Diagnosis not present

## 2020-04-07 DIAGNOSIS — I129 Hypertensive chronic kidney disease with stage 1 through stage 4 chronic kidney disease, or unspecified chronic kidney disease: Secondary | ICD-10-CM | POA: Diagnosis not present

## 2020-04-07 DIAGNOSIS — Z8249 Family history of ischemic heart disease and other diseases of the circulatory system: Secondary | ICD-10-CM | POA: Diagnosis not present

## 2020-04-07 DIAGNOSIS — I160 Hypertensive urgency: Secondary | ICD-10-CM | POA: Diagnosis present

## 2020-04-07 DIAGNOSIS — H9190 Unspecified hearing loss, unspecified ear: Secondary | ICD-10-CM | POA: Diagnosis present

## 2020-04-07 DIAGNOSIS — Z20822 Contact with and (suspected) exposure to covid-19: Secondary | ICD-10-CM | POA: Diagnosis present

## 2020-04-07 DIAGNOSIS — Z79899 Other long term (current) drug therapy: Secondary | ICD-10-CM | POA: Diagnosis not present

## 2020-04-07 DIAGNOSIS — M25551 Pain in right hip: Secondary | ICD-10-CM | POA: Diagnosis not present

## 2020-04-07 DIAGNOSIS — E860 Dehydration: Secondary | ICD-10-CM | POA: Diagnosis present

## 2020-04-07 DIAGNOSIS — M25572 Pain in left ankle and joints of left foot: Secondary | ICD-10-CM | POA: Diagnosis not present

## 2020-04-07 DIAGNOSIS — W1830XA Fall on same level, unspecified, initial encounter: Secondary | ICD-10-CM | POA: Diagnosis present

## 2020-04-07 DIAGNOSIS — E538 Deficiency of other specified B group vitamins: Secondary | ICD-10-CM | POA: Diagnosis present

## 2020-04-07 DIAGNOSIS — Z96642 Presence of left artificial hip joint: Secondary | ICD-10-CM | POA: Diagnosis not present

## 2020-04-07 DIAGNOSIS — Z043 Encounter for examination and observation following other accident: Secondary | ICD-10-CM | POA: Diagnosis not present

## 2020-04-07 DIAGNOSIS — Z419 Encounter for procedure for purposes other than remedying health state, unspecified: Secondary | ICD-10-CM

## 2020-04-07 DIAGNOSIS — G629 Polyneuropathy, unspecified: Secondary | ICD-10-CM

## 2020-04-07 DIAGNOSIS — F05 Delirium due to known physiological condition: Secondary | ICD-10-CM | POA: Diagnosis not present

## 2020-04-07 DIAGNOSIS — Z7982 Long term (current) use of aspirin: Secondary | ICD-10-CM

## 2020-04-07 DIAGNOSIS — S72012D Unspecified intracapsular fracture of left femur, subsequent encounter for closed fracture with routine healing: Secondary | ICD-10-CM | POA: Diagnosis not present

## 2020-04-07 DIAGNOSIS — M6281 Muscle weakness (generalized): Secondary | ICD-10-CM | POA: Diagnosis not present

## 2020-04-07 LAB — CBC WITH DIFFERENTIAL/PLATELET
Abs Immature Granulocytes: 0.07 10*3/uL (ref 0.00–0.07)
Basophils Absolute: 0.1 10*3/uL (ref 0.0–0.1)
Basophils Relative: 0 %
Eosinophils Absolute: 0 10*3/uL (ref 0.0–0.5)
Eosinophils Relative: 0 %
HCT: 43.4 % (ref 36.0–46.0)
Hemoglobin: 14.8 g/dL (ref 12.0–15.0)
Immature Granulocytes: 1 %
Lymphocytes Relative: 8 %
Lymphs Abs: 1.2 10*3/uL (ref 0.7–4.0)
MCH: 31.5 pg (ref 26.0–34.0)
MCHC: 34.1 g/dL (ref 30.0–36.0)
MCV: 92.3 fL (ref 80.0–100.0)
Monocytes Absolute: 1.3 10*3/uL — ABNORMAL HIGH (ref 0.1–1.0)
Monocytes Relative: 8 %
Neutro Abs: 12.4 10*3/uL — ABNORMAL HIGH (ref 1.7–7.7)
Neutrophils Relative %: 83 %
Platelets: 254 10*3/uL (ref 150–400)
RBC: 4.7 MIL/uL (ref 3.87–5.11)
RDW: 13.9 % (ref 11.5–15.5)
WBC: 15 10*3/uL — ABNORMAL HIGH (ref 4.0–10.5)
nRBC: 0 % (ref 0.0–0.2)

## 2020-04-07 LAB — TROPONIN I (HIGH SENSITIVITY)
Troponin I (High Sensitivity): 86 ng/L — ABNORMAL HIGH (ref <18)
Troponin I (High Sensitivity): 94 ng/L — ABNORMAL HIGH (ref <18)

## 2020-04-07 LAB — BASIC METABOLIC PANEL
Anion gap: 9 (ref 5–15)
BUN: 21 mg/dL (ref 8–23)
CO2: 25 mmol/L (ref 22–32)
Calcium: 8.9 mg/dL (ref 8.9–10.3)
Chloride: 102 mmol/L (ref 98–111)
Creatinine, Ser: 1.05 mg/dL — ABNORMAL HIGH (ref 0.44–1.00)
GFR, Estimated: 51 mL/min — ABNORMAL LOW (ref >60)
Glucose, Bld: 105 mg/dL — ABNORMAL HIGH (ref 70–99)
Potassium: 4.1 mmol/L (ref 3.5–5.1)
Sodium: 136 mmol/L (ref 135–145)

## 2020-04-07 LAB — PROTIME-INR
INR: 1.1 (ref 0.8–1.2)
Prothrombin Time: 13.5 seconds (ref 11.4–15.2)

## 2020-04-07 LAB — RESP PANEL BY RT-PCR (FLU A&B, COVID) ARPGX2
Influenza A by PCR: NEGATIVE
Influenza B by PCR: NEGATIVE
SARS Coronavirus 2 by RT PCR: NEGATIVE

## 2020-04-07 MED ORDER — MORPHINE SULFATE (PF) 2 MG/ML IV SOLN
2.0000 mg | Freq: Once | INTRAVENOUS | Status: DC
  Filled 2020-04-07: qty 1

## 2020-04-07 MED ORDER — SENNA 8.6 MG PO TABS
1.0000 | ORAL_TABLET | Freq: Two times a day (BID) | ORAL | Status: DC
  Administered 2020-04-08 – 2020-04-12 (×9): 8.6 mg via ORAL
  Filled 2020-04-07 (×10): qty 1

## 2020-04-07 MED ORDER — POLYETHYLENE GLYCOL 3350 17 G PO PACK: 17.0000 g | PACK | Freq: Every day | ORAL | Status: DC | PRN

## 2020-04-07 MED ORDER — HYDRALAZINE HCL 25 MG PO TABS
25.0000 mg | ORAL_TABLET | Freq: Four times a day (QID) | ORAL | Status: DC | PRN
  Administered 2020-04-08 – 2020-04-11 (×5): 25 mg via ORAL
  Filled 2020-04-07 (×5): qty 1

## 2020-04-07 MED ORDER — SODIUM CHLORIDE 0.9 % IV SOLN: Freq: Once | INTRAVENOUS | Status: AC

## 2020-04-07 MED ORDER — MORPHINE SULFATE (PF) 2 MG/ML IV SOLN
0.5000 mg | INTRAVENOUS | Status: DC | PRN
  Administered 2020-04-08: 03:00:00 0.5 mg via INTRAVENOUS

## 2020-04-07 MED ORDER — BISACODYL 5 MG PO TBEC
5.0000 mg | DELAYED_RELEASE_TABLET | Freq: Every day | ORAL | Status: DC | PRN
Start: 2020-04-07 — End: 2020-04-14

## 2020-04-07 MED ORDER — HYDROCODONE-ACETAMINOPHEN 5-325 MG PO TABS
1.0000 | ORAL_TABLET | Freq: Four times a day (QID) | ORAL | Status: DC | PRN
  Administered 2020-04-08 – 2020-04-09 (×3): 2 via ORAL
  Filled 2020-04-07 (×3): qty 2

## 2020-04-07 MED ORDER — ACETAMINOPHEN 500 MG PO TABS
1000.0000 mg | ORAL_TABLET | Freq: Once | ORAL | Status: AC
  Administered 2020-04-07: 1000 mg via ORAL
  Filled 2020-04-07: qty 2

## 2020-04-07 NOTE — ED Notes (Signed)
Pt moved from hallway bed to cpod for admission.  Pt alert.  purewick in place.

## 2020-04-07 NOTE — ED Notes (Signed)
Admitting at bedside 

## 2020-04-07 NOTE — H&P (Signed)
History and Physical    Diane Rogers AUQ:[id number] DOB: [birth year] DOA: 04/07/2020  PCP: [facility]  Patient coming from: Home, lives with daughter  I have personally briefly reviewed patient's old medical records in [facility]  Chief Complaint: Right hip pain  HPI: Diane Rogers is a 87 y.o. female with medical history significant of hypertension, hyperlipidemia, depression, impaired glucose tolerance presented to the ED with complaints of progressively worsening right hip pain.  Patient reports sustaining a mechanical fall a week ago Saturday while ambulating with her walker in the home.  She cannot recall exactly what she was doing at the time.  She denies having any preceding dizziness, lightheadedness, chest pain, palpitations or other symptoms prior to the fall.  She did not require medical attention at the time and was able to ambulate.  Reports her doctor told her to do some gentle exercise and patient was able to ambulate.  However, the mild intermittent pain became more severe and persistent this week, prompting evaluation today.    Patient is otherwise been in good health recently with no recent infections, cough or sore throat, fevers or chills, nausea vomiting or diarrhea, dysuria or urinary frequency or any other complaints.  Currently without any symptoms other than hip pain.  Denies having any chest pain.  Patient lives with her daughter.  At baseline she ambulates with a walker.   ED Course: Uncontrolled blood pressure 222/58 otherwise normal vitals.  Labs mostly unremarkable other than leukocytosis of 15k, hs-troponin 86.  Chest x-ray negative, showed mild cardiomegaly.  Pelvis and right femur x-rays showed acutely displaced right femoral neck fracture.  ED physician discussed case with on-call orthopedic surgeon, Dr. Odis Luster.    Patient admitted to hospitalist service with orthopedic consult for repair of right hip fracture.   Review of  Systems: As per HPI otherwise 10 point review of systems negative.    Past Medical History:  Diagnosis Date  . Depression   . Glucose intolerance (impaired glucose tolerance)   . Hyperlipidemia   . Hypertension   . Ovarian failure     Past Surgical History:  Procedure Laterality Date  . HIP ARTHROPLASTY Left 03/03/2018   Procedure: ARTHROPLASTY BIPOLAR HIP (HEMIARTHROPLASTY)-LEFT;  Surgeon: Deeann Saint, MD;  Location: [facility] ORS;  Service: Orthopedics;  Laterality: Left;  . NO PAST SURGERIES       reports that she has never smoked. She has never used smokeless tobacco. She reports that she does not drink alcohol and does not use drugs.  Allergies  Allergen Reactions  . Penicillins     Family history Hypertension in multiple family members    Prior to Admission medications   Medication Sig Start Date End Date Taking? Authorizing Provider  acetaminophen (TYLENOL) 325 MG tablet Take 650 mg by mouth every 6 (six) hours as needed. 03/10/18   [provider]  amLODipine-benazepril (LOTREL) 10-20 MG capsule Take 1 capsule by mouth daily. 03/20/18   Sharee Holster, NP  aspirin 325 MG tablet Take 1 tablet (325 mg total) by mouth 2 (two) times daily. 03/07/18   Shaune Pollack, MD  bisacodyl (BISACODYL) 5 MG EC tablet Take 1 tablet (5 mg total) by mouth daily as needed for moderate constipation. 03/06/18   Shaune Pollack, MD  calcium carbonate (TUMS - DOSED IN MG ELEMENTAL CALCIUM) 500 MG chewable tablet Chew 1 tablet by mouth 3 (three) times daily. 03/08/18   [provider]  Infant Care Products (DERMACLOUD) CREA Apply  liberal amount topically to area of skin irritation two times daily and as needed.  Ok to leave at bedside 03/15/18   [provider]  NON FORMULARY Diet Type:  Regular 03/07/18   [provider]    Physical Exam: Vitals:   04/07/20 1401 04/07/20 1402 04/07/20 1514  BP: (!) 222/58  (!) 190/61  Pulse: 75  76  Resp: 18  18  Temp: 98.1 F (36.7  C)    TempSrc: Oral    SpO2: 98%  98%  Weight:  64 kg   Height:  5\' 1"  (1.549 m)     Constitutional: NAD, calm, comfortable Eyes: EOMI, small symmetric pupils, lids and conjunctivae normal ENMT: Mucous membranes are moist.  Hard of hearing.  Respiratory: CTAB, no wheezing, no crackles. Normal respiratory effort. No accessory muscle use.  Cardiovascular: RRR, no murmurs / rubs / gallops. No extremity edema. 2+ pedal pulses.  Abdomen: soft, NT, ND, no masses or HSM palpated. +Bowel sounds.  Musculoskeletal: no clubbing / cyanosis.  Right lower extremity shortened and externally rotated. Skin: dry, intact, normal color, normal temperature Neurologic: CN 2-12 grossly intact. Normal speech.  Grossly non-focal exam. Psychiatric: Alert and oriented x 3. Normal mood. Congruent affect.  Normal judgement and insight.    Labs on Admission: I have personally reviewed following labs and imaging studies  CBC: Recent Labs  Lab 04/07/20 1629  WBC 15.0*  NEUTROABS 12.4*  HGB 14.8  HCT 43.4  MCV 92.3  PLT 254   Basic Metabolic Panel: Recent Labs  Lab 04/07/20 1629  NA 136  K 4.1  CL 102  CO2 25  GLUCOSE 105*  BUN 21  CREATININE 1.05*  CALCIUM 8.9   GFR: Estimated Creatinine Clearance: 31.1 mL/min (A) (by C-G formula based on SCr of 1.05 mg/dL (H)). Liver Function Tests: No results for input(s): AST, ALT, ALKPHOS, BILITOT, PROT, ALBUMIN in the last 168 hours. No results for input(s): LIPASE, AMYLASE in the last 168 hours. No results for input(s): AMMONIA in the last 168 hours. Coagulation Profile: Recent Labs  Lab 04/07/20 1629  INR 1.1   Cardiac Enzymes: No results for input(s): CKTOTAL, CKMB, CKMBINDEX, TROPONINI in the last 168 hours. BNP (last 3 results) No results for input(s): PROBNP in the last 8760 hours. HbA1C: No results for input(s): HGBA1C in the last 72 hours. CBG: No results for input(s): GLUCAP in the last 168 hours. Lipid Profile: No results for  input(s): CHOL, HDL, LDLCALC, TRIG, CHOLHDL, LDLDIRECT in the last 72 hours. Thyroid Function Tests: No results for input(s): TSH, T4TOTAL, FREET4, T3FREE, THYROIDAB in the last 72 hours. Anemia Panel: No results for input(s): VITAMINB12, FOLATE, FERRITIN, TIBC, IRON, RETICCTPCT in the last 72 hours. Urine analysis:    Component Value Date/Time   COLORURINE STRAW (A) 03/04/2018 2346   APPEARANCEUR CLEAR (A) 03/04/2018 2346   LABSPEC 1.004 (L) 03/04/2018 2346   PHURINE 6.0 03/04/2018 2346   GLUCOSEU NEGATIVE 03/04/2018 2346   HGBUR NEGATIVE 03/04/2018 2346   BILIRUBINUR NEGATIVE 03/04/2018 2346   KETONESUR NEGATIVE 03/04/2018 2346   PROTEINUR NEGATIVE 03/04/2018 2346   NITRITE NEGATIVE 03/04/2018 2346   LEUKOCYTESUR NEGATIVE 03/04/2018 2346    Radiological Exams on Admission: DG Chest 1 View  Result Date: 04/07/2020 CLINICAL DATA:  04/09/2020 2 days ago EXAM: CHEST  1 VIEW COMPARISON:  03/02/2018 FINDINGS: Mild cardiomegaly with aortic atherosclerosis. No focal opacity or pleural effusion. No pneumothorax. IMPRESSION: No active disease. Mild cardiomegaly. Electronically Signed   By: 03/04/2018  Jake Samples M.D.   On: 04/07/2020 15:11   DG Pelvis 1-2 Views  Result Date: 04/07/2020 CLINICAL DATA:  Fall with hip pain EXAM: PELVIS - 1-2 VIEW COMPARISON:  03/03/2018 FINDINGS: Previous left hip replacement with normal alignment. Pubic symphysis and rami appear intact. Acute mildly displaced right femoral neck fracture. IMPRESSION: Acute mildly displaced right femoral neck fracture. Electronically Signed   By: Jasmine Pang M.D.   On: 04/07/2020 15:09   DG Femur Min 2 Views Right  Result Date: 04/07/2020 CLINICAL DATA:  Right-sided hip pain after fall 2 days ago EXAM: RIGHT FEMUR 2 VIEWS COMPARISON:  03/03/2018 FINDINGS: Acute displaced right femoral neck fracture. Femoral head projects in joint. The remainder of the right femur is intact. There are vascular calcifications. IMPRESSION: Acute displaced right  femoral neck fracture. Electronically Signed   By: Jasmine Pang M.D.   On: 04/07/2020 15:09    EKG: Independently reviewed.  Normal sinus rhythm at 71 bpm, left axis deviation, nonspecific T wave changes, no ST elevation  Assessment/Plan Principal Problem:   Closed right hip fracture [facility]) Active Problems:   Essential hypertension, benign   Lower extremity edema   Dyslipidemia   Anemia of chronic disease   Vitamin D deficiency   Peripheral neuropathy    Right hip fracture -sustained a mechanical fall on 2/19.  Patient was initially able to ambulate but has had progressive pain since and found with fracture on imaging today as above. --Ortho consulted, Dr. Odis Luster --SCDs for DVT prophylaxis pending plan for surgery --Pain control as needed --Bowel regimen --N.p.o. for now with maintenance IV hydration  Essential hypertension -as needed hydralazine for now.  Continue home meds pending med history.  Hyperlipidemia -pending med history continue home regimen     DVT prophylaxis: SCDs Start: 04/07/20 1727   Code Status: Full Family Communication: Daughter Bonita Quin at bedside during admission encounter Disposition Plan: DC home with home health versus SNF following surgery and therapy evaluations Consults called: Ortho Dr. Odis Luster contacted by ED physician  Status is: Inpatient  Remains inpatient appropriate because:Inpatient level of care appropriate due to severity of illness with hip fracture pending surgery and therapy evaluations postop.   Dispo: The patient is from: Home              Anticipated d/c is to: SNF              Patient currently is not medically stable to d/c.   Difficult to place patient No     Pennie Banter, DO [facility]  04/07/2020, 5:51 PM    If 7PM-7AM, please contact night-coverage. How to contact the [facility] Attending or Consulting provider 7A - 7P or covering provider during after hours 7P -7A, for this patient?    1. Check the care  team in [facility] and look for a) attending/consulting [facility] provider listed and b) the [facility] team listed 2. Log into [email or web address] and use [facility] universal password to access. If you do not have the password, please contact the hospital operator. 3. Locate the [facility] provider you are looking for under [facility] and page to a number that you can be directly reached. 4. If you still have difficulty reaching the provider, please page the [facility] (Director on Call) for the Hospitalists listed on amion for assistance.

## 2020-04-07 NOTE — ED Notes (Signed)
Refusing in and out cath.

## 2020-04-07 NOTE — ED Notes (Signed)
MD at bedside to update on plan of care.  

## 2020-04-07 NOTE — ED Notes (Signed)
Patient transported to X-ray 

## 2020-04-07 NOTE — ED Notes (Signed)
Patient refusing CT head. Family/patient hesitant about bloodwork and EKG.

## 2020-04-07 NOTE — ED Triage Notes (Signed)
Patient coming from home. Fall 2 days ago. Reports increasing pain to right leg. Patent denies pain when she isnt moving leg. Patient alert and oriented x2 at baseline.

## 2020-04-07 NOTE — ED Provider Notes (Signed)
[facility] Emergency Department Provider Note  ____________________________________________   Event Date/Time   First MD Initiated Contact with Patient 04/07/20 1403     (approximate)  I have reviewed the triage vital signs and the nursing notes.   HISTORY  Chief Complaint Leg Pain    HPI Diane Rogers is a 87 y.o. female with history of hypertension, hyperlipidemia, here with follow-up.  The patient actually fell last weekend.  She initially had minimal pain at the time.  She was able to ambulate.  However, over the last 24 hours, she has had acute worsening of pain and now is unable to bear weight.  She has severe, aching, throbbing, 8 out of 10 pain in the right leg with any kind movement or weightbearing.  She is unable bear weight today.  No distal numbness or weakness.  No other complaints.  She did not hit her head.  No neck pain or stiffness.        Past Medical History:  Diagnosis Date  . Depression   . Glucose intolerance (impaired glucose tolerance)   . Hyperlipidemia   . Hypertension   . Ovarian failure     Patient Active Problem List   Diagnosis Date Noted  . Dyslipidemia 03/13/2018  . Anemia of chronic disease 03/13/2018  . Vitamin D deficiency 03/13/2018  . Peripheral neuropathy 03/13/2018  . Hypocalcemia 03/13/2018  . Hip fracture (HCC) 03/02/2018  . Lower extremity edema 07/21/2015  . Depression, endogenous (HCC) 08/01/2014  . Essential hypertension, benign 09/12/2006    Past Surgical History:  Procedure Laterality Date  . HIP ARTHROPLASTY Left 03/03/2018   Procedure: ARTHROPLASTY BIPOLAR HIP (HEMIARTHROPLASTY)-LEFT;  Surgeon: Deeann SaintMiller, Howard, MD;  Location: [facility] ORS;  Service: Orthopedics;  Laterality: Left;  . NO PAST SURGERIES      Prior to Admission medications   Medication Sig Start Date End Date Taking? Authorizing Provider  acetaminophen (TYLENOL) 325 MG tablet Take 650 mg by mouth every 6 (six) hours as  needed. 03/10/18   [provider]  amLODipine-benazepril (LOTREL) 10-20 MG capsule Take 1 capsule by mouth daily. 03/20/18   Sharee HolsterGreen, Deborah S, NP  aspirin 325 MG tablet Take 1 tablet (325 mg total) by mouth 2 (two) times daily. 03/07/18   Shaune Pollackhen, Qing, MD  bisacodyl (BISACODYL) 5 MG EC tablet Take 1 tablet (5 mg total) by mouth daily as needed for moderate constipation. 03/06/18   Shaune Pollackhen, Qing, MD  calcium carbonate (TUMS - DOSED IN MG ELEMENTAL CALCIUM) 500 MG chewable tablet Chew 1 tablet by mouth 3 (three) times daily. 03/08/18   [provider]  Infant Care Products (DERMACLOUD) CREA Apply liberal amount topically to area of skin irritation two times daily and as needed.  Ok to leave at bedside 03/15/18   [provider]  NON FORMULARY Diet Type:  Regular 03/07/18   [provider]    Allergies Penicillins  No family history on file.  Social History Social History   Tobacco Use  . Smoking status: Never Smoker  . Smokeless tobacco: Never Used  Substance Use Topics  . Alcohol use: No    Alcohol/week: 0.0 standard drinks  . Drug use: No    Review of Systems  Review of Systems  Constitutional: Positive for fatigue. Negative for fever.  HENT: Negative for congestion and sore throat.   Eyes: Negative for visual disturbance.  Respiratory: Negative for cough and shortness of breath.   Cardiovascular: Negative for chest pain.  Gastrointestinal: Negative  for abdominal pain, diarrhea, nausea and vomiting.  Genitourinary: Negative for flank pain.  Musculoskeletal: Positive for arthralgias and gait problem. Negative for back pain and neck pain.  Skin: Negative for rash and wound.  Neurological: Positive for weakness.  All other systems reviewed and are negative.    ____________________________________________  PHYSICAL EXAM:      VITAL SIGNS: ED Triage Vitals  Enc Vitals Group     BP 04/07/20 1401 (!) 222/58     Pulse Rate 04/07/20 1401 75     Resp  04/07/20 1401 18     Temp 04/07/20 1401 98.1 F (36.7 C)     Temp Source 04/07/20 1401 Oral     SpO2 04/07/20 1401 98 %     Weight 04/07/20 1402 141 lb 1.5 oz (64 kg)     Height 04/07/20 1402 5\' 1"  (1.549 m)     Head Circumference --      Peak Flow --      Pain Score 04/07/20 1401 0     Pain Loc --      Pain Edu? --      Excl. in GC? --      Physical Exam Vitals and nursing note reviewed.  Constitutional:      General: She is not in acute distress.    Appearance: She is well-developed.  HENT:     Head: Normocephalic and atraumatic.  Eyes:     Conjunctiva/sclera: Conjunctivae normal.  Cardiovascular:     Rate and Rhythm: Normal rate and regular rhythm.     Heart sounds: Normal heart sounds. No murmur heard. No friction rub.  Pulmonary:     Effort: Pulmonary effort is normal. No respiratory distress.     Breath sounds: Normal breath sounds. No wheezing or rales.  Abdominal:     General: Abdomen is flat. There is no distension.     Palpations: Abdomen is soft.     Tenderness: There is no abdominal tenderness.  Musculoskeletal:     Cervical back: Neck supple.     Comments: Slight shortening and external rotation of the right lower extremity.  Skin:    General: Skin is warm.     Capillary Refill: Capillary refill takes less than 2 seconds.  Neurological:     Mental Status: She is alert and oriented to person, place, and time.     Motor: No abnormal muscle tone.     Comments: Distal strength and sensation 5 out of 5 and intact throughout the right lower extremity.       ____________________________________________   LABS (all labs ordered are listed, but only abnormal results are displayed)  Labs Reviewed  CBC WITH DIFFERENTIAL/PLATELET - Abnormal; Notable for the following components:      Result Value   WBC 15.0 (*)    Neutro Abs 12.4 (*)    Monocytes Absolute 1.3 (*)    All other components within normal limits  BASIC METABOLIC PANEL  PROTIME-INR  TROPONIN  I (HIGH SENSITIVITY)    ____________________________________________  EKG: Normal sinus rhythm, ventricular rate 71.  PR 158, QRS 76, QTc 454.  No acute ST elevations depression but no acute evidence of acute ischemia or infarct. ________________________________________  RADIOLOGY All imaging, including plain films, CT scans, and ultrasounds, independently reviewed by me, and interpretations confirmed via formal radiology reads.  ED MD interpretation:   Chest x-ray: Clear X-ray pelvis: Right femoral neck fracture DG femur: Right femoral neck fracture  Official radiology report(s): DG Chest 1 View  Result Date: 04/07/2020 CLINICAL DATA:  Larey Seat 2 days ago EXAM: CHEST  1 VIEW COMPARISON:  03/02/2018 FINDINGS: Mild cardiomegaly with aortic atherosclerosis. No focal opacity or pleural effusion. No pneumothorax. IMPRESSION: No active disease. Mild cardiomegaly. Electronically Signed   By: Jasmine Pang M.D.   On: 04/07/2020 15:11   DG Pelvis 1-2 Views  Result Date: 04/07/2020 CLINICAL DATA:  Fall with hip pain EXAM: PELVIS - 1-2 VIEW COMPARISON:  03/03/2018 FINDINGS: Previous left hip replacement with normal alignment. Pubic symphysis and rami appear intact. Acute mildly displaced right femoral neck fracture. IMPRESSION: Acute mildly displaced right femoral neck fracture. Electronically Signed   By: Jasmine Pang M.D.   On: 04/07/2020 15:09   DG Femur Min 2 Views Right  Result Date: 04/07/2020 CLINICAL DATA:  Right-sided hip pain after fall 2 days ago EXAM: RIGHT FEMUR 2 VIEWS COMPARISON:  03/03/2018 FINDINGS: Acute displaced right femoral neck fracture. Femoral head projects in joint. The remainder of the right femur is intact. There are vascular calcifications. IMPRESSION: Acute displaced right femoral neck fracture. Electronically Signed   By: Jasmine Pang M.D.   On: 04/07/2020 15:09    ____________________________________________  PROCEDURES   Procedure(s) performed (including  Critical Care):  .1-3 Lead EKG Interpretation Performed by: Shaune Pollack, MD Authorized by: Shaune Pollack, MD     Interpretation: normal     ECG rate:  70-90   ECG rate assessment: normal     Rhythm: sinus rhythm     Ectopy: none     Conduction: normal   Comments:     Indication: Fall    ____________________________________________  INITIAL IMPRESSION / MDM / ASSESSMENT AND PLAN / ED COURSE  As part of my medical decision making, I reviewed the following data within the electronic MEDICAL RECORD NUMBER Nursing notes reviewed and incorporated, Old chart reviewed, Notes from prior ED visits, and [place] Controlled Substance Database       *SHERLONDA FLATER was evaluated in Emergency Department on 04/07/2020 for the symptoms described in the history of present illness. She was evaluated in the context of the global COVID-19 pandemic, which necessitated consideration that the patient might be at risk for infection with the SARS-CoV-2 virus that causes COVID-19. Institutional protocols and algorithms that pertain to the evaluation of patients at risk for COVID-19 are in a state of rapid change based on information released by regulatory bodies including the CDC and federal and state organizations. These policies and algorithms were followed during the patient's care in the ED.  Some ED evaluations and interventions may be delayed as a result of limited staffing during the pandemic.*     Medical Decision Making: 87 year old female here with right hip pain after mechanical fall several days ago.  Exam as above, patient with shortening and external rotation of the right leg but is neurovascularly intact.  Imaging shows right femoral neck fracture.  Lab work is pending.  She denies any head injury or neck pain and refuses head CT, which is reasonable in setting of no neurological symptoms and the patient is not on anticoagulants.  Discussed with Dr. Odis Luster of orthopedics who will see the patient.   Will admit to medicine.  ____________________________________________  FINAL CLINICAL IMPRESSION(S) / ED DIAGNOSES  Final diagnoses:  Closed fracture of right hip, initial encounter [facility])     MEDICATIONS GIVEN DURING THIS VISIT:  Medications  morphine 2 MG/ML injection 2 mg (2 mg Intravenous Not Given 04/07/20 1651)  acetaminophen (TYLENOL) tablet 1,000 mg (1,000  mg Oral Given 04/07/20 1430)     ED Discharge Orders    None       Note:  This document was prepared using Dragon voice recognition software and may include unintentional dictation errors.   Shaune Pollack, MD 04/07/20 [phone]

## 2020-04-08 DIAGNOSIS — S72001D Fracture of unspecified part of neck of right femur, subsequent encounter for closed fracture with routine healing: Secondary | ICD-10-CM

## 2020-04-08 DIAGNOSIS — I10 Essential (primary) hypertension: Secondary | ICD-10-CM | POA: Diagnosis not present

## 2020-04-08 LAB — BASIC METABOLIC PANEL
Anion gap: 7 (ref 5–15)
BUN: 23 mg/dL (ref 8–23)
CO2: 28 mmol/L (ref 22–32)
Calcium: 8.6 mg/dL — ABNORMAL LOW (ref 8.9–10.3)
Chloride: 105 mmol/L (ref 98–111)
Creatinine, Ser: 0.96 mg/dL (ref 0.44–1.00)
GFR, Estimated: 57 mL/min — ABNORMAL LOW (ref >60)
Glucose, Bld: 103 mg/dL — ABNORMAL HIGH (ref 70–99)
Potassium: 4.1 mmol/L (ref 3.5–5.1)
Sodium: 140 mmol/L (ref 135–145)

## 2020-04-08 LAB — SURGICAL PCR SCREEN
MRSA, PCR: NEGATIVE
Staphylococcus aureus: NEGATIVE

## 2020-04-08 LAB — CBC
HCT: 41.7 % (ref 36.0–46.0)
Hemoglobin: 13.7 g/dL (ref 12.0–15.0)
MCH: 30.6 pg (ref 26.0–34.0)
MCHC: 32.9 g/dL (ref 30.0–36.0)
MCV: 93.1 fL (ref 80.0–100.0)
Platelets: 231 10*3/uL (ref 150–400)
RBC: 4.48 MIL/uL (ref 3.87–5.11)
RDW: 14 % (ref 11.5–15.5)
WBC: 12.2 10*3/uL — ABNORMAL HIGH (ref 4.0–10.5)
nRBC: 0 % (ref 0.0–0.2)

## 2020-04-08 LAB — URINALYSIS, COMPLETE (UACMP) WITH MICROSCOPIC
Bilirubin Urine: NEGATIVE
Glucose, UA: NEGATIVE mg/dL
Ketones, ur: 5 mg/dL — AB
Nitrite: POSITIVE — AB
Protein, ur: 30 mg/dL — AB
Specific Gravity, Urine: 1.019 (ref 1.005–1.030)
pH: 5 (ref 5.0–8.0)

## 2020-04-08 MED ORDER — SODIUM CHLORIDE 0.9 % IV SOLN
1.0000 g | INTRAVENOUS | Status: DC
  Administered 2020-04-08 – 2020-04-10 (×3): 1 g via INTRAVENOUS
  Filled 2020-04-08: qty 10
  Filled 2020-04-08 (×3): qty 1

## 2020-04-08 MED ORDER — ENSURE ENLIVE PO LIQD
237.0000 mL | Freq: Two times a day (BID) | ORAL | Status: DC
  Administered 2020-04-10 – 2020-04-11 (×3): 237 mL via ORAL

## 2020-04-08 MED ORDER — ADULT MULTIVITAMIN W/MINERALS CH
1.0000 | ORAL_TABLET | Freq: Every day | ORAL | Status: DC
  Administered 2020-04-10 – 2020-04-14 (×5): 1 via ORAL
  Filled 2020-04-08 (×5): qty 1

## 2020-04-08 MED ORDER — HYDRALAZINE HCL 20 MG/ML IJ SOLN
10.0000 mg | Freq: Four times a day (QID) | INTRAMUSCULAR | Status: DC | PRN
  Administered 2020-04-10: 10 mg via INTRAVENOUS
  Filled 2020-04-08 (×2): qty 1

## 2020-04-08 MED ORDER — SODIUM CHLORIDE 0.9 % IV SOLN
INTRAVENOUS | Status: DC | PRN
  Administered 2020-04-08 – 2020-04-09 (×2): 1000 mL via INTRAVENOUS

## 2020-04-08 MED ORDER — CHLORHEXIDINE GLUCONATE CLOTH 2 % EX PADS
6.0000 | MEDICATED_PAD | Freq: Every day | CUTANEOUS | Status: DC
  Administered 2020-04-08 – 2020-04-14 (×5): 6 via TOPICAL

## 2020-04-08 MED ORDER — TRANEXAMIC ACID 1000 MG/10ML IV SOLN
2000.0000 mg | Freq: Once | INTRAVENOUS | Status: DC
  Filled 2020-04-08: qty 20

## 2020-04-08 NOTE — Evaluation (Signed)
Occupational Therapy Evaluation Patient Details Name: Diane Rogers MRN: [id number] DOB: 07-21-1928 Today's Date: 04/08/2020    History of Present Illness Pt is an 87 y/o F with PMH: HTN, HLD, depression and fall with L hemiarthroplasty in Jan 2020. Pt now presents after sustaining mechanical fall. Pelvis and right femur x-rays showed acutely displaced right femoral neck fracture. Pt with sx sched for 04/09/2020.   Clinical Impression   Pt seen for OT evaluation this date in setting of acute hospitalization d/t fall with surgery scheduled tomorrow, April 09, 2020. Pt pleasant and agreeable throughout session. Pt's daughter present for much of session and does express frustration with general hospital stay which is relayed to LPN by this author. Pt on bed rest orders this date which limits mobility aspects of treatment. OT assesses pt's functional strength and ROM. UE ROM WFL, with MMT grossly 4-/5, and some tremors noted which pt attributes to "nerves". Pt noted to have limited AROM of R LE with only DF/PF grossly adequate on assessment (PROM and MMT not assessed to R LE). Pt with function AROM at bed level of L LE. ADL assessment completed at bed level d/t bed rest orders and pt currently requiring SETUP to MIN A with UB ADLs including self-feeding (again, 2/2 some light tremors) and MAX A required to assist for LB ADLs at bed level. Education provided re: role of OT in acute setting, POC relative to pt's personal goals for after surgery, importance of resuming OOB activity as soon as approved by Careers adviser. Pt with good understanding and agreeable. Will require new OT orders following surgery along with WB precautions if any. Will continue to follow acutely (as appropriate and as approved by MD). Based on limited assessment performed today, anticipate pt will require f/u with rehabilitation, but will continue to update as more assessment and treatment are able to occur.     Follow Up Recommendations   SNF    Equipment Recommendations  3 in 1 bedside commode    Recommendations for Other Services       Precautions / Restrictions Precautions Precautions: Fall Restrictions Weight Bearing Restrictions: No      Mobility Bed Mobility               General bed mobility comments: deferred    Transfers                 General transfer comment: NT, bed rest orders    Balance                                           ADL either performed or assessed with clinical judgement   ADL Overall ADL's : Needs assistance/impaired                                       General ADL Comments: all ADLs assessed at bed level d/t bed rest orders, pt requires SETUP to MIN A Wtih UB ADLs this date 2/2 UE tremors that pt attribute to feeling nervous. Pt requries MAX A for LB ADLs at bed level this date d/t limited ROM of R LE     Vision Patient Visual Report: No change from baseline       Perception     Praxis      Pertinent  Vitals/Pain Pain Assessment: 0-10 Pain Score: 0-No pain Pain Location: pt on bed rest at time of OT evaluation, does reports very slight discomfort, but no actual pain. Does demo decreased ROM of R LE, anticipate pt would have significant pain with mobilization Pain Intervention(s): Limited activity within patient's tolerance     Hand Dominance Right   Extremity/Trunk Assessment Upper Extremity Assessment Upper Extremity Assessment: Overall WFL for tasks assessed;Generalized weakness (ROM WFL, MMT grossly 4-/5. Noted some UE shaking this date, pt states not normal at baseline and she feels this is d/t nerves 2/2 pending surgery.)   Lower Extremity Assessment Lower Extremity Assessment: RLE deficits/detail;LLE deficits/detail RLE Deficits / Details: limited AROM in hip or knee, PROM not assessed for pt comfort and d/t pending surgery. Pt with adequate DF/PF and ankle rotation, but otherwise unable to meaningfully move  R LE LLE Deficits / Details: ROM WFL, MMT grossly 4-/5       Communication Communication Communication: HOH   Cognition Arousal/Alertness: Awake/alert Behavior During Therapy: WFL for tasks assessed/performed;Anxious Overall Cognitive Status: Within Functional Limits for tasks assessed                                 General Comments: pt is HOH, so some reposnses are delayed, but she is oriented and appropriate with all commands. Pt is generally kind and agreeable. Pt's hands noted to be somewhat shaky which she attributes to nerves. Note: pt's daughter Bonita Quin is present in room for most of session and expressing frustration.   General Comments       Exercises Other Exercises Other Exercises: OT Educates pt and dtr re: role of OT in acute setting, POC relative to pt's personal goals for after surgery, importance of resuming OOB activity as soon as approved by surgeon and UB strengthening as it will play a role in offloading weight from post-op LE with use of RW. Pt with good understanding.   Shoulder Instructions      Home Living Family/patient expects to be discharged to:: Private residence Living Arrangements: Children (daughter) Available Help at Discharge: Family;Available PRN/intermittently (dtr works from home) Type of Home: House Home Access: Stairs to enter Secretary/administrator of Steps: 2 Entrance Stairs-Rails:  (railing in the middle) Home Layout: Multi-level Alternate Level Stairs-Number of Steps: 6 Alternate Level Stairs-Rails: Right Bathroom Shower/Tub: Producer, television/film/video: Standard     Home Equipment: Cane - single point;Walker - 2 wheels;Shower seat          Prior Functioning/Environment Level of Independence: Independent with assistive device(s)        Comments: Pt states that she still drives, gets own groceries, primarily used [facility] for fxl mobility. Goes to the movies with friends.        OT Problem List: Decreased  strength;Decreased range of motion;Decreased activity tolerance;Impaired balance (sitting and/or standing);Decreased knowledge of use of DME or AE;Pain;Increased edema      OT Treatment/Interventions: Self-care/ADL training;DME and/or AE instruction;Therapeutic activities;Balance training;Therapeutic exercise;Energy conservation;Patient/family education    OT Goals(Current goals can be found in the care plan section) Acute Rehab OT Goals Patient Stated Goal: to get stronger and get back to independence OT Goal Formulation: With patient Time For Goal Achievement: 04/22/20 Potential to Achieve Goals: Good ADL Goals Pt Will Perform Lower Body Dressing: with mod assist;with adaptive equipment;sitting/lateral leans Pt Will Transfer to Toilet: with mod assist;stand pivot transfer;bedside commode Pt/caregiver will Perform Home Exercise Program:  Increased strength;Both right and left upper extremity;With minimal assist  OT Frequency: Min 1X/week   Barriers to D/C:            Co-evaluation              AM-PAC OT "6 Clicks" Daily Activity     Outcome Measure Help from another person eating meals?: A Little Help from another person taking care of personal grooming?: A Little Help from another person toileting, which includes using toliet, bedpan, or urinal?: A Lot Help from another person bathing (including washing, rinsing, drying)?: A Lot Help from another person to put on and taking off regular upper body clothing?: A Little Help from another person to put on and taking off regular lower body clothing?: A Lot 6 Click Score: 15   End of Session Nurse Communication: Mobility status;Other (comment) (notified of daughter's expressed frustrations during session including feeling she didn't know when pt's surgery was scheduled for.)  Activity Tolerance: Treatment limited secondary to medical complications (Comment) (limited 2/2 bedrest orders) Patient left: in bed;with call bell/phone  within reach;with bed alarm set;with family/visitor present  OT Visit Diagnosis: Muscle weakness (generalized) (M62.81);History of falling (Z91.81);Pain Pain - Right/Left: Right Pain - part of body: Leg                Time: 1010-1043 OT Time Calculation (min): 33 min Charges:  OT General Charges $OT Visit: 1 Visit OT Evaluation $OT Eval Moderate Complexity: 1 Mod OT Treatments $Self Care/Home Management : 8-22 mins  Rejeana Brock, MS, OTR/L ascom [phone] 04/08/20, 11:18 AM

## 2020-04-08 NOTE — Plan of Care (Signed)
New care plan initiated 

## 2020-04-08 NOTE — Progress Notes (Signed)
Initial Nutrition Assessment  DOCUMENTATION CODES:   Not applicable  INTERVENTION:  Recommend liberalizing diet to regular.  Provide Ensure Enlive po BID, each supplement provides 350 kcal and 20 grams of protein. Patient prefers chocolate.  Provide MVI po daily.  NUTRITION DIAGNOSIS:   Increased nutrient needs related to post-op healing as evidenced by estimated needs.  GOAL:   Patient will meet greater than or equal to 90% of their needs  MONITOR:   PO intake,Supplement acceptance,Labs,Weight trends,I & O's  REASON FOR ASSESSMENT:   Consult Assessment of nutrition requirement/status  ASSESSMENT:   87 year old female with PMHx of HLD, HTN, depression admitted with right hip femoral neck fracture.   Met with patient and her daughter at bedside. Patient had difficulty hearing RD so a lot of history provided by daughter. Patient lives with her other daughter. She typically eats 2 meals per day. Appetite and intake have naturally decreased over time but she still eats fairly well per daughter's report. Patient does not like the food here so plan is for family to bring in outside food she does enjoy. Plan is for surgery tomorrow. Discussed increased nutrient needs for post-operative healing. Patient is amenable to trying chocolate Ensure Enlive to help meet protein needs but she does not want to try them today. Tomorrow patient will be NPO for surgery, which is scheduled for the afternoon, so will order ONS to start on 3/3.  Daughter reports patient has likely lost weight over time but they are unsure of UBW or exact weight trend. Per chart patient was documented to be 63.5 kg on 03/20/2018 and is now documented to be 66.7 kg (147.1 lbs).  Medications reviewed and include: senna 1 tablet BID, ceftriaxone.  Labs reviewed.  Patient does not meet criteria for malnutrition at this time.  Discussed with MD via secure chat. Okay to liberalize diet to regular.  NUTRITION - FOCUSED  PHYSICAL EXAM:  Flowsheet Row Most Recent Value  Orbital Region No depletion  Upper Arm Region No depletion  Thoracic and Lumbar Region No depletion  Buccal Region No depletion  Temple Region Mild depletion  Clavicle Bone Region No depletion  Clavicle and Acromion Bone Region Mild depletion  Scapular Bone Region No depletion  Dorsal Hand Mild depletion  Patellar Region No depletion  Anterior Thigh Region No depletion  Posterior Calf Region No depletion  Edema (RD Assessment) None  Hair Reviewed  Eyes Reviewed  Mouth Reviewed  Skin Reviewed  Nails Reviewed     Diet Order:   Diet Order            Diet NPO time specified  Diet effective midnight           Diet Heart Room service appropriate? Yes; Fluid consistency: Thin  Diet effective now                EDUCATION NEEDS:   No education needs have been identified at this time  Skin:  Skin Assessment: Reviewed RN Assessment  Last BM:  04/07/2020 per chart  Height:   Ht Readings from Last 1 Encounters:  04/07/20 _0  (1.549 m)   Weight:   Wt Readings from Last 1 Encounters:  04/07/20 66.7 kg   BMI:  Body mass index is 27.79 kg/m.  Estimated Nutritional Needs:   Kcal:  1600-1800  Protein:  80-90 grams  Fluid:  1.6 L/day  Jacklynn Barnacle, MS, RD, LDN Pager number available on Amion

## 2020-04-08 NOTE — Progress Notes (Addendum)
PROGRESS NOTE    Diane Rogers   NUU:[id number]  DOB: 18-Jan-1929  PCP: [facility]    DOA: 04/07/2020 LOS: 1   Brief Narrative   87 y.o. female with medical history significant of hypertension, hyperlipidemia, depression, impaired glucose tolerance presented to the ED 04/07/20 with complaints of progressively worsening right hip pain.  Patient reports sustaining a mechanical fall a week ago Saturday while ambulating with her walker in the home.  In the ED, BP uncontrolled 222/58.  Leukocytosis on labs which were otherwise unremarkable.   Pelvis and right femur x-rays showed acutely displaced right femoral neck fracture.  ED physician discussed case with on-call orthopedic surgeon, Dr. Odis Luster.    Plan is for surgery tomorrow 3/2.     Assessment & Plan   Principal Problem:   Closed right hip fracture [facility]) Active Problems:   Essential hypertension, benign   Lower extremity edema   Dyslipidemia   Anemia of chronic disease   Vitamin D deficiency   Peripheral neuropathy   Right hip fracture -sustained a mechanical fall on 2/19.  Patient was initially able to ambulate but has had progressive pain since and found with fracture on imaging today as above. --Ortho consulted, Dr. Odis Luster --SCDs for DVT prophylaxis pending plan for surgery --Pain control as needed --Bowel regimen --N.p.o. after midnight  Abnormal UA / ?UTI - pt denies symptoms but has UA consistent with infection and leukocytosis (infection vs reactive).  On Rocephin since having surgery.  Urine culture pending.   Essential hypertension -as needed hydralazine for now.  Continue home meds pending med history.  Hyperlipidemia -pending med history continue home regimen     Patient BMI: Body mass index is 27.79 kg/m.   DVT prophylaxis: SCDs Start: 04/07/20 1727   Diet:  Diet Orders (From admission, onward)    Start     Ordered   04/08/20 1654  Diet regular Room service appropriate?  Yes; Fluid consistency: Thin  Diet effective now       Question Answer Comment  Room service appropriate? Yes   Fluid consistency: Thin      04/08/20 1653            Code Status: Full Code    Subjective 04/08/20    Pt awake sitting up in bed.  Two daughters at bedside.  Pt reported minimal pain, but was having quite a bit earlier, medication relieved it.  Denies any other complaints.   Disposition Plan & Communication   Status is: Inpatient  Inpatient status appropriate as patient is undergoing surgery for hip fracture tomorrow, requiring IV meds.  Dispo: The patient is from: home              Anticipated d/c is to: home vs SNF pending postop PT eval              Patient currently NOT medically stable for d/c.   Difficult to place patient No   Consults, Procedures, Significant Events   Consultants:   Orthopedics  Procedures:   3/2 - surgical repair of R hip fracture  Antimicrobials:  Anti-infectives (From admission, onward)   Start     Dose/Rate Route Frequency Ordered Stop   04/08/20 1000  cefTRIAXone (ROCEPHIN) 1 g in sodium chloride 0.9 % 100 mL IVPB        1 g 200 mL/hr over 30 Minutes Intravenous Every 24 hours 04/08/20 0853          Micro    Objective  Vitals:   04/08/20 0416 04/08/20 0459 04/08/20 1011 04/08/20 1535  BP: (!) 180/74 (!) 157/85 (!) 171/77 (!) 192/64  Pulse: 72 78 80 70  Resp: 18  17 16   Temp: 98.1 F (36.7 C)  98.4 F (36.9 C) (!) 97.4 F (36.3 C)  TempSrc: Oral     SpO2: 98%  (!) 89% 97%  Weight:      Height:        Intake/Output Summary (Last 24 hours) at 04/08/2020 1804 Last data filed at 04/08/2020 1500 Gross per 24 hour  Intake 121.36 ml  Output 400 ml  Net -278.64 ml   Filed Weights   04/07/20 1402 04/07/20 2052  Weight: 64 kg 66.7 kg    Physical Exam:  General exam: awake, alert, no acute distress Respiratory system: CTAB, no wheezes, rales rhonchi, normal respiratory effort. Cardiovascular system: normal  S1/S2, RRR, no pedal edema.   Gastrointestinal system: soft, NT, ND, +bowel sounds. Central nervous system: A&O x3. no gross focal neurologic deficits, normal speech Extremities: RLE externally rotated and shortened, no edema, normal tone Psychiatry: normal mood, congruent affect, judgement and insight appear normal  Labs   Data Reviewed: I have personally reviewed following labs and imaging studies  CBC: Recent Labs  Lab 04/07/20 1629 04/08/20 0514  WBC 15.0* 12.2*  NEUTROABS 12.4*  --   HGB 14.8 13.7  HCT 43.4 41.7  MCV 92.3 93.1  PLT 254 231   Basic Metabolic Panel: Recent Labs  Lab 04/07/20 1629 04/08/20 0514  NA 136 140  K 4.1 4.1  CL 102 105  CO2 25 28  GLUCOSE 105* 103*  BUN 21 23  CREATININE 1.05* 0.96  CALCIUM 8.9 8.6*   GFR: Estimated Creatinine Clearance: 34.7 mL/min (by C-G formula based on SCr of 0.96 mg/dL). Liver Function Tests: No results for input(s): AST, ALT, ALKPHOS, BILITOT, PROT, ALBUMIN in the last 168 hours. No results for input(s): LIPASE, AMYLASE in the last 168 hours. No results for input(s): AMMONIA in the last 168 hours. Coagulation Profile: Recent Labs  Lab 04/07/20 1629  INR 1.1   Cardiac Enzymes: No results for input(s): CKTOTAL, CKMB, CKMBINDEX, TROPONINI in the last 168 hours. BNP (last 3 results) No results for input(s): PROBNP in the last 8760 hours. HbA1C: No results for input(s): HGBA1C in the last 72 hours. CBG: No results for input(s): GLUCAP in the last 168 hours. Lipid Profile: No results for input(s): CHOL, HDL, LDLCALC, TRIG, CHOLHDL, LDLDIRECT in the last 72 hours. Thyroid Function Tests: No results for input(s): TSH, T4TOTAL, FREET4, T3FREE, THYROIDAB in the last 72 hours. Anemia Panel: No results for input(s): VITAMINB12, FOLATE, FERRITIN, TIBC, IRON, RETICCTPCT in the last 72 hours. Sepsis Labs: No results for input(s): PROCALCITON, LATICACIDVEN in the last 168 hours.  Recent Results (from the past 240  hour(s))  Resp Panel by RT-PCR (Flu A&B, Covid) Nasopharyngeal Swab     Status: None   Collection Time: 04/07/20  5:16 PM   Specimen: Nasopharyngeal Swab; Nasopharyngeal(NP) swabs in vial transport medium  Result Value Ref Range Status   SARS Coronavirus 2 by RT PCR NEGATIVE NEGATIVE Final    Comment: (NOTE) SARS-CoV-2 target nucleic acids are NOT DETECTED.  The SARS-CoV-2 RNA is generally detectable in upper respiratory specimens during the acute phase of infection. The lowest concentration of SARS-CoV-2 viral copies this assay can detect is 138 copies/mL. A negative result does not preclude SARS-Cov-2 infection and should not be used as the sole basis for treatment or  other patient management decisions. A negative result may occur with  improper specimen collection/handling, submission of specimen other than nasopharyngeal swab, presence of viral mutation(s) within the areas targeted by this assay, and inadequate number of viral copies(<138 copies/mL). A negative result must be combined with clinical observations, patient history, and epidemiological information. The expected result is Negative.  Fact Sheet for Patients:  BloggerCourse.com  Fact Sheet for Healthcare Providers:  SeriousBroker.it  This test is no t yet approved or cleared by the [place] FDA and  has been authorized for detection and/or diagnosis of SARS-CoV-2 by FDA under an Emergency Use Authorization (EUA). This EUA will remain  in effect (meaning this test can be used) for the duration of the COVID-19 declaration under Section 564(b)(1) of the Act, 21 U.S.C.section 360bbb-3(b)(1), unless the authorization is terminated  or revoked sooner.       Influenza A by PCR NEGATIVE NEGATIVE Final   Influenza B by PCR NEGATIVE NEGATIVE Final    Comment: (NOTE) The Xpert Xpress SARS-CoV-2/FLU/RSV plus assay is intended as an aid in the diagnosis of influenza from  Nasopharyngeal swab specimens and should not be used as a sole basis for treatment. Nasal washings and aspirates are unacceptable for Xpert Xpress SARS-CoV-2/FLU/RSV testing.  Fact Sheet for Patients: BloggerCourse.com  Fact Sheet for Healthcare Providers: SeriousBroker.it  This test is not yet approved or cleared by the [place] FDA and has been authorized for detection and/or diagnosis of SARS-CoV-2 by FDA under an Emergency Use Authorization (EUA). This EUA will remain in effect (meaning this test can be used) for the duration of the COVID-19 declaration under Section 564(b)(1) of the Act, 21 U.S.C. section 360bbb-3(b)(1), unless the authorization is terminated or revoked.  Performed at [facility], [address] [place], [place] [address]   Surgical pcr screen     Status: None   Collection Time: 04/08/20  1:33 AM   Specimen: Nasal Mucosa; Nasal Swab  Result Value Ref Range Status   MRSA, PCR NEGATIVE NEGATIVE Final   Staphylococcus aureus NEGATIVE NEGATIVE Final    Comment: (NOTE) The Xpert SA Assay (FDA approved for NASAL specimens in patients 39 years of age and older), is one component of a comprehensive surveillance program. It is not intended to diagnose infection nor to guide or monitor treatment. Performed at [facility], [address], [place], [place] [address]       Imaging Studies   DG Chest 1 View  Result Date: 04/07/2020 CLINICAL DATA:  Larey Seat 2 days ago EXAM: CHEST  1 VIEW COMPARISON:  03/02/2018 FINDINGS: Mild cardiomegaly with aortic atherosclerosis. No focal opacity or pleural effusion. No pneumothorax. IMPRESSION: No active disease. Mild cardiomegaly. Electronically Signed   By: Jasmine Pang M.D.   On: 04/07/2020 15:11   DG Pelvis 1-2 Views  Result Date: 04/07/2020 CLINICAL DATA:  Fall with hip pain EXAM: PELVIS - 1-2 VIEW COMPARISON:  03/03/2018 FINDINGS: Previous left  hip replacement with normal alignment. Pubic symphysis and rami appear intact. Acute mildly displaced right femoral neck fracture. IMPRESSION: Acute mildly displaced right femoral neck fracture. Electronically Signed   By: Jasmine Pang M.D.   On: 04/07/2020 15:09   DG Femur Min 2 Views Right  Result Date: 04/07/2020 CLINICAL DATA:  Right-sided hip pain after fall 2 days ago EXAM: RIGHT FEMUR 2 VIEWS COMPARISON:  03/03/2018 FINDINGS: Acute displaced right femoral neck fracture. Femoral head projects in joint. The remainder of the right femur is intact. There are vascular  calcifications. IMPRESSION: Acute displaced right femoral neck fracture. Electronically Signed   By: Jasmine PangKim  Fujinaga M.D.   On: 04/07/2020 15:09     Medications   Scheduled Meds: . Chlorhexidine Gluconate Cloth  6 each Topical Daily  . [START ON 04/10/2020] feeding supplement  237 mL Oral BID BM  .  morphine injection  2 mg Intravenous Once  . [START ON 04/10/2020] multivitamin with minerals  1 tablet Oral Daily  . senna  1 tablet Oral BID  . tranexamic acid (CYKLOKAPRON) topical - INTRAOP  2,000 mg Topical Once   Continuous Infusions: . sodium chloride 1,000 mL (04/08/20 1221)  . cefTRIAXone (ROCEPHIN)  IV 1 g (04/08/20 1224)       LOS: 1 day    Time spent: 25 minutes    Pennie BanterKelly A Aashka Salomone, DO [facility]  04/08/2020, 6:04 PM      If 7PM-7AM, please contact night-coverage. How to contact the [facility] Attending or Consulting provider 7A - 7P or covering provider during after hours 7P -7A, for this patient?    1. Check the care team in [facility] and look for a) attending/consulting [facility] provider listed and b) the [facility] team listed 2. Log into [email or web address] and use [place]'s universal password to access. If you do not have the password, please contact the hospital operator. 3. Locate the [facility] provider you are looking for under [facility] and page to a number that you can be directly reached. 4. If you still have  difficulty reaching the provider, please page the [facility] (Director on Call) for the Hospitalists listed on amion for assistance.

## 2020-04-08 NOTE — Consult Note (Signed)
ORTHOPAEDIC CONSULTATION  REQUESTING PHYSICIAN: Pennie Banter, DO  Chief Complaint: right hip pain  HPI: Diane Rogers is a 87 y.o. female who complains of right hip pain after fall. Please see H&P and ED notes for details. Denies any numbness, tingling or constitutional symptoms.  Past Medical History:  Diagnosis Date  . Depression   . Glucose intolerance (impaired glucose tolerance)   . Hyperlipidemia   . Hypertension   . Ovarian failure    Past Surgical History:  Procedure Laterality Date  . HIP ARTHROPLASTY Left 03/03/2018   Procedure: ARTHROPLASTY BIPOLAR HIP (HEMIARTHROPLASTY)-LEFT;  Surgeon: Deeann Saint, MD;  Location: [facility] ORS;  Service: Orthopedics;  Laterality: Left;  . NO PAST SURGERIES     Social History   Socioeconomic History  . Marital status: Widowed    Spouse name: Not on file  . Number of children: Not on file  . Years of education: Not on file  . Highest education level: Not on file  Occupational History  . Not on file  Tobacco Use  . Smoking status: Never Smoker  . Smokeless tobacco: Never Used  Substance and Sexual Activity  . Alcohol use: No    Alcohol/week: 0.0 standard drinks  . Drug use: No  . Sexual activity: Not on file  Other Topics Concern  . Not on file  Social History Narrative  . Not on file   Social Determinants of Health   Financial Resource Strain: Not on file  Food Insecurity: Not on file  Transportation Needs: Not on file  Physical Activity: Not on file  Stress: Not on file  Social Connections: Not on file   History reviewed. No pertinent family history. Allergies  Allergen Reactions  . Penicillins Other (See Comments)    Unknown reaction, was always told she had allergy   Prior to Admission medications   Medication Sig Start Date End Date Taking? Authorizing Provider  acetaminophen (TYLENOL) 325 MG tablet Take 650 mg by mouth every 6 (six) hours as needed. Patient not taking: No sig reported 03/10/18    [provider]  amLODipine-benazepril (LOTREL) 10-20 MG capsule Take 1 capsule by mouth daily. Patient not taking: No sig reported 03/20/18   Sharee Holster, NP  aspirin 325 MG tablet Take 1 tablet (325 mg total) by mouth 2 (two) times daily. Patient not taking: No sig reported 03/07/18   Shaune Pollack, MD  bisacodyl (BISACODYL) 5 MG EC tablet Take 1 tablet (5 mg total) by mouth daily as needed for moderate constipation. Patient not taking: No sig reported 03/06/18   Shaune Pollack, MD  calcium carbonate (TUMS - DOSED IN MG ELEMENTAL CALCIUM) 500 MG chewable tablet Chew 1 tablet by mouth 3 (three) times daily. Patient not taking: No sig reported 03/08/18   [provider]  Infant Care Products (DERMACLOUD) CREA Apply liberal amount topically to area of skin irritation two times daily and as needed.  Ok to leave at bedside 03/15/18   [provider]  NON FORMULARY Diet Type:  Regular 03/07/18   [provider]   DG Chest 1 View  Result Date: 04/07/2020 CLINICAL DATA:  Larey Seat 2 days ago EXAM: CHEST  1 VIEW COMPARISON:  03/02/2018 FINDINGS: Mild cardiomegaly with aortic atherosclerosis. No focal opacity or pleural effusion. No pneumothorax. IMPRESSION: No active disease. Mild cardiomegaly. Electronically Signed   By: Jasmine Pang M.D.   On: 04/07/2020 15:11   DG Pelvis 1-2 Views  Result Date: 04/07/2020 CLINICAL DATA:  Fall with  hip pain EXAM: PELVIS - 1-2 VIEW COMPARISON:  03/03/2018 FINDINGS: Previous left hip replacement with normal alignment. Pubic symphysis and rami appear intact. Acute mildly displaced right femoral neck fracture. IMPRESSION: Acute mildly displaced right femoral neck fracture. Electronically Signed   By: Jasmine Pang M.D.   On: 04/07/2020 15:09   DG Femur Min 2 Views Right  Result Date: 04/07/2020 CLINICAL DATA:  Right-sided hip pain after fall 2 days ago EXAM: RIGHT FEMUR 2 VIEWS COMPARISON:  03/03/2018 FINDINGS: Acute displaced right femoral neck  fracture. Femoral head projects in joint. The remainder of the right femur is intact. There are vascular calcifications. IMPRESSION: Acute displaced right femoral neck fracture. Electronically Signed   By: Jasmine Pang M.D.   On: 04/07/2020 15:09    Positive ROS: All other systems have been reviewed and were otherwise negative with the exception of those mentioned in the HPI and as above.  Physical Exam: General: Alert, no acute distress Cardiovascular: No pedal edema Respiratory: No cyanosis, no use of accessory musculature GI: No organomegaly, abdomen is soft and non-tender Skin: No lesions in the area of chief complaint Neurologic: Sensation intact distally Psychiatric: Patient is competent for consent with normal mood and affect Lymphatic: No axillary or cervical lymphadenopathy  MUSCULOSKELETAL: Right leg short, externally rotated. Compartments soft. Good cap refill. Motor and sensory intact distally.  Assessment: Right hip femoral neck fracture, closed, displaced  Plan: Plan a right hip hemiarthroplasty tomorrow.   The diagnosis, risks, benefits and alternatives to treatment are all discussed in detail with the patient and family. Risks include but are not limited to bleeding, infection, deep vein thrombosis, pulmonary embolism, nerve or vascular injury, non-union, repeat operation, persistent pain, weakness, stiffness and death. She understands and is eager to proceed.     Lyndle Herrlich, MD    04/08/2020 1:08 PM

## 2020-04-08 NOTE — Progress Notes (Signed)
PT Cancellation Note  Patient Details Name: Diane Rogers MRN: [id number] DOB: 1928-05-30   Cancelled Treatment:    Reason Eval/Treat Not Completed: Medical issues which prohibited therapy (Consult received and chart reviewed.  Per discussion with primary RN, patient scheduled for surgical repair to R hip 04/09/2020.  Will hold PT evaluation until procedure complete and patient cleared for activity.  Of note, will require new orders post-op; please re-consult as medically appropriate.)   Kristen H. Manson Passey, PT, DPT, NCS 04/08/20, 9:41 AM [phone]

## 2020-04-08 NOTE — Hospital Course (Signed)
87 y.o. female with medical history significant of hypertension, hyperlipidemia, depression, impaired glucose tolerance presented to the ED 04/07/20 with complaints of progressively worsening right hip pain.  Patient reports sustaining a mechanical fall a week ago Saturday while ambulating with her walker in the home.  In the ED, BP uncontrolled 222/58.  Leukocytosis on labs which were otherwise unremarkable.   Pelvis and right femur x-rays showed acutely displaced right femoral neck fracture.  ED physician discussed case with on-call orthopedic surgeon, Dr. Odis Luster.    Plan is for surgery tomorrow 3/2.

## 2020-04-09 ENCOUNTER — Inpatient Hospital Stay: Payer: Medicare HMO

## 2020-04-09 ENCOUNTER — Inpatient Hospital Stay: Payer: Medicare HMO | Admitting: Certified Registered"

## 2020-04-09 ENCOUNTER — Encounter
Admission: EM | Disposition: A | Discharge status: Home Health | Payer: Self-pay | Source: Home / Self Care | Attending: Internal Medicine

## 2020-04-09 ENCOUNTER — Encounter: Payer: Self-pay | Admitting: Internal Medicine

## 2020-04-09 DIAGNOSIS — S72001D Fracture of unspecified part of neck of right femur, subsequent encounter for closed fracture with routine healing: Secondary | ICD-10-CM | POA: Diagnosis not present

## 2020-04-09 DIAGNOSIS — I10 Essential (primary) hypertension: Secondary | ICD-10-CM | POA: Diagnosis not present

## 2020-04-09 HISTORY — PX: HIP ARTHROPLASTY: SHX981

## 2020-04-09 LAB — CBC
HCT: 43.1 % (ref 36.0–46.0)
Hemoglobin: 13.8 g/dL (ref 12.0–15.0)
MCH: 29.9 pg (ref 26.0–34.0)
MCHC: 32 g/dL (ref 30.0–36.0)
MCV: 93.3 fL (ref 80.0–100.0)
Platelets: 250 10*3/uL (ref 150–400)
RBC: 4.62 MIL/uL (ref 3.87–5.11)
RDW: 14 % (ref 11.5–15.5)
WBC: 12.3 10*3/uL — ABNORMAL HIGH (ref 4.0–10.5)
nRBC: 0 % (ref 0.0–0.2)

## 2020-04-09 LAB — URINE CULTURE

## 2020-04-09 SURGERY — HEMIARTHROPLASTY, HIP, DIRECT ANTERIOR APPROACH, FOR FRACTURE
Anesthesia: Spinal | Site: Hip | Laterality: Right

## 2020-04-09 MED ORDER — FENTANYL CITRATE (PF) 100 MCG/2ML IJ SOLN: 25.0000 ug | INTRAMUSCULAR | Status: DC | PRN

## 2020-04-09 MED ORDER — ONDANSETRON HCL 4 MG PO TABS: 4.0000 mg | ORAL_TABLET | Freq: Four times a day (QID) | ORAL | Status: DC | PRN

## 2020-04-09 MED ORDER — TRANEXAMIC ACID-NACL 1000-0.7 MG/100ML-% IV SOLN
INTRAVENOUS | Status: DC | PRN
  Administered 2020-04-09: 1000 mg via INTRAVENOUS

## 2020-04-09 MED ORDER — HYDROCODONE-ACETAMINOPHEN 5-325 MG PO TABS
1.0000 | ORAL_TABLET | ORAL | Status: DC | PRN
  Administered 2020-04-09 – 2020-04-12 (×3): 2 via ORAL
  Filled 2020-04-09 (×3): qty 2

## 2020-04-09 MED ORDER — METOCLOPRAMIDE HCL 10 MG PO TABS: 5.0000 mg | ORAL_TABLET | Freq: Three times a day (TID) | ORAL | Status: DC | PRN

## 2020-04-09 MED ORDER — EPHEDRINE SULFATE 50 MG/ML IJ SOLN
INTRAMUSCULAR | Status: DC | PRN
  Administered 2020-04-09: 7.5 mg via INTRAVENOUS

## 2020-04-09 MED ORDER — ONDANSETRON HCL 4 MG/2ML IJ SOLN: 4.0000 mg | Freq: Once | INTRAMUSCULAR | Status: DC | PRN

## 2020-04-09 MED ORDER — METOCLOPRAMIDE HCL 5 MG/ML IJ SOLN: 5.0000 mg | Freq: Three times a day (TID) | INTRAMUSCULAR | Status: DC | PRN

## 2020-04-09 MED ORDER — AMLODIPINE BESYLATE 5 MG PO TABS
5.0000 mg | ORAL_TABLET | Freq: Every day | ORAL | Status: DC
  Administered 2020-04-10 – 2020-04-14 (×5): 5 mg via ORAL
  Filled 2020-04-09 (×5): qty 1

## 2020-04-09 MED ORDER — PHENYLEPHRINE HCL (PRESSORS) 10 MG/ML IV SOLN
INTRAVENOUS | Status: DC | PRN
  Administered 2020-04-09 (×4): 100 ug via INTRAVENOUS

## 2020-04-09 MED ORDER — SODIUM CHLORIDE 0.9 % IV SOLN
12.5000 mg/h | Freq: Once | INTRAVENOUS | Status: DC
  Administered 2020-04-09: 12.5 mg/h via INTRAVENOUS

## 2020-04-09 MED ORDER — LACTATED RINGERS IV SOLN: INTRAVENOUS | Status: DC

## 2020-04-09 MED ORDER — PROPOFOL 500 MG/50ML IV EMUL
INTRAVENOUS | Status: AC
  Filled 2020-04-09: qty 50

## 2020-04-09 MED ORDER — LABETALOL HCL 5 MG/ML IV SOLN
INTRAVENOUS | Status: AC
  Administered 2020-04-09: 20 mg via INTRAVENOUS
  Filled 2020-04-09: qty 4

## 2020-04-09 MED ORDER — BUPIVACAINE HCL (PF) 0.5 % IJ SOLN
INTRAMUSCULAR | Status: AC
  Filled 2020-04-09: qty 10

## 2020-04-09 MED ORDER — PROPOFOL 500 MG/50ML IV EMUL
INTRAVENOUS | Status: DC | PRN
  Administered 2020-04-09: 15 ug/kg/min via INTRAVENOUS

## 2020-04-09 MED ORDER — KETAMINE HCL 50 MG/5ML IJ SOSY
PREFILLED_SYRINGE | INTRAMUSCULAR | Status: AC
  Filled 2020-04-09: qty 5

## 2020-04-09 MED ORDER — ACETAMINOPHEN 325 MG PO TABS
325.0000 mg | ORAL_TABLET | Freq: Four times a day (QID) | ORAL | Status: DC | PRN
  Administered 2020-04-13 – 2020-04-14 (×3): 650 mg via ORAL
  Filled 2020-04-09 (×3): qty 2

## 2020-04-09 MED ORDER — DOCUSATE SODIUM 100 MG PO CAPS
100.0000 mg | ORAL_CAPSULE | Freq: Two times a day (BID) | ORAL | Status: DC
  Administered 2020-04-09 – 2020-04-12 (×7): 100 mg via ORAL
  Filled 2020-04-09 (×8): qty 1

## 2020-04-09 MED ORDER — SODIUM CHLORIDE 0.9 % IV SOLN
INTRAVENOUS | Status: DC | PRN
  Administered 2020-04-09: 40 ug/min via INTRAVENOUS

## 2020-04-09 MED ORDER — HYDROCODONE-ACETAMINOPHEN 5-325 MG PO TABS
1.0000 | ORAL_TABLET | ORAL | Status: DC | PRN
  Administered 2020-04-09: 2 via ORAL
  Filled 2020-04-09: qty 2

## 2020-04-09 MED ORDER — ONDANSETRON HCL 4 MG/2ML IJ SOLN: 4.0000 mg | Freq: Four times a day (QID) | INTRAMUSCULAR | Status: DC | PRN

## 2020-04-09 MED ORDER — LABETALOL HCL 5 MG/ML IV SOLN: 20.0000 mg | Freq: Once | INTRAVENOUS | Status: AC

## 2020-04-09 MED ORDER — ASPIRIN EC 81 MG PO TBEC: 81.0000 mg | DELAYED_RELEASE_TABLET | Freq: Every day | ORAL | Status: DC

## 2020-04-09 MED ORDER — PROPOFOL 10 MG/ML IV BOLUS
INTRAVENOUS | Status: DC | PRN
  Administered 2020-04-09 (×2): 20 mg via INTRAVENOUS
  Administered 2020-04-09: 10 mg via INTRAVENOUS

## 2020-04-09 MED ORDER — KETOROLAC TROMETHAMINE 15 MG/ML IJ SOLN
7.5000 mg | Freq: Four times a day (QID) | INTRAMUSCULAR | Status: AC
  Administered 2020-04-10 (×3): 7.5 mg via INTRAVENOUS
  Filled 2020-04-09 (×3): qty 1

## 2020-04-09 MED ORDER — BUPIVACAINE HCL (PF) 0.5 % IJ SOLN
INTRAMUSCULAR | Status: DC | PRN
  Administered 2020-04-09: 2.5 mL

## 2020-04-09 MED ORDER — KETAMINE HCL 10 MG/ML IJ SOLN
INTRAMUSCULAR | Status: DC | PRN
  Administered 2020-04-09 (×2): 10 mg via INTRAVENOUS

## 2020-04-09 MED ORDER — BUPIVACAINE-EPINEPHRINE (PF) 0.25% -1:200000 IJ SOLN
INTRAMUSCULAR | Status: DC | PRN
  Administered 2020-04-09: 30 mL via PERINEURAL

## 2020-04-09 MED ORDER — OXYCODONE HCL 5 MG PO TABS: 5.0000 mg | ORAL_TABLET | Freq: Once | ORAL | Status: DC | PRN

## 2020-04-09 MED ORDER — MEPERIDINE HCL 50 MG/ML IJ SOLN
INTRAMUSCULAR | Status: AC
  Filled 2020-04-09: qty 1

## 2020-04-09 MED ORDER — OXYCODONE HCL 5 MG/5ML PO SOLN
5.0000 mg | Freq: Once | ORAL | Status: DC | PRN
Start: 2020-04-09 — End: 2020-04-09

## 2020-04-09 MED ORDER — TRAMADOL HCL 50 MG PO TABS
50.0000 mg | ORAL_TABLET | Freq: Four times a day (QID) | ORAL | Status: DC
Start: 2020-04-10 — End: 2020-04-14
  Administered 2020-04-10 – 2020-04-12 (×7): 50 mg via ORAL
  Filled 2020-04-09 (×10): qty 1

## 2020-04-09 SURGICAL SUPPLY — 50 items
APL PRP STRL LF DISP 70% ISPRP (MISCELLANEOUS) ×1
BLADE SAGITTAL WIDE XTHICK NO (BLADE) ×2 IMPLANT
BRUSH SCRUB EZ  4% CHG (MISCELLANEOUS) ×2
BRUSH SCRUB EZ 4% CHG (MISCELLANEOUS) ×2 IMPLANT
CHLORAPREP W/TINT 26 (MISCELLANEOUS) ×2 IMPLANT
COVER WAND RF STERILE (DRAPES) ×2 IMPLANT
DRAPE 3/4 80X56 (DRAPES) ×2 IMPLANT
DRAPE C-ARM 42X72 X-RAY (DRAPES) ×2 IMPLANT
DRAPE STERI IOBAN 125X83 (DRAPES) IMPLANT
DRSG AQUACEL AG ADV 3.5X10 (GAUZE/BANDAGES/DRESSINGS) ×1 IMPLANT
DRSG AQUACEL AG ADV 3.5X14 (GAUZE/BANDAGES/DRESSINGS) IMPLANT
ELECT BLADE 6.5 EXT (BLADE) ×2 IMPLANT
ELECT REM PT RETURN 9FT ADLT (ELECTROSURGICAL) ×2
ELECTRODE REM PT RTRN 9FT ADLT (ELECTROSURGICAL) ×1 IMPLANT
FEM HEAD COCR 28MM 3 (Orthopedic Implant) ×2 IMPLANT
FEMORAL HEAD COCR 28MM 3 (Orthopedic Implant) IMPLANT
GAUZE XEROFORM 1X8 LF (GAUZE/BANDAGES/DRESSINGS) ×1 IMPLANT
GLOVE INDICATOR 8.0 STRL GRN (GLOVE) ×2 IMPLANT
GLOVE SURG ORTHO LTX SZ8 (GLOVE) ×4 IMPLANT
GOWN STRL REUS W/ TWL LRG LVL3 (GOWN DISPOSABLE) ×1 IMPLANT
GOWN STRL REUS W/ TWL XL LVL3 (GOWN DISPOSABLE) ×1 IMPLANT
GOWN STRL REUS W/TWL LRG LVL3 (GOWN DISPOSABLE) ×2
GOWN STRL REUS W/TWL XL LVL3 (GOWN DISPOSABLE) ×2
HOOD PEEL AWAY FLYTE STAYCOOL (MISCELLANEOUS) ×6 IMPLANT
IRRIGATION SURGIPHOR STRL (IV SOLUTION) IMPLANT
IV NS 1000ML (IV SOLUTION) ×2
IV NS 1000ML BAXH (IV SOLUTION) ×1 IMPLANT
KIT PATIENT CARE HANA TABLE (KITS) ×2 IMPLANT
KIT TURNOVER CYSTO (KITS) ×2 IMPLANT
LINER BIPOLAR 28MM (Liner) ×1 IMPLANT
MANIFOLD NEPTUNE II (INSTRUMENTS) ×2 IMPLANT
MAT ABSORB  FLUID 56X50 GRAY (MISCELLANEOUS) ×1
MAT ABSORB FLUID 56X50 GRAY (MISCELLANEOUS) ×1 IMPLANT
NDL SAFETY ECLIPSE 18X1.5 (NEEDLE) ×2 IMPLANT
NDL SPNL 20GX3.5 QUINCKE YW (NEEDLE) ×1 IMPLANT
NEEDLE HYPO 18GX1.5 SHARP (NEEDLE) ×4
NEEDLE HYPO 22GX1.5 SAFETY (NEEDLE) ×2 IMPLANT
NEEDLE SPNL 20GX3.5 QUINCKE YW (NEEDLE) ×2 IMPLANT
PACK HIP PROSTHESIS (MISCELLANEOUS) ×2 IMPLANT
PADDING CAST BLEND 4X4 NS (MISCELLANEOUS) ×4 IMPLANT
PILLOW ABDUCTION MEDIUM (MISCELLANEOUS) ×2 IMPLANT
PULSAVAC PLUS IRRIG FAN TIP (DISPOSABLE) ×2
STAPLER SKIN PROX 35W (STAPLE) ×2 IMPLANT
STEM STD COLLAR SZ3 POLARSTEM (Stem) ×1 IMPLANT
SUT BONE WAX W31G (SUTURE) ×1 IMPLANT
SUT DVC 2 QUILL PDO  T11 36X36 (SUTURE) ×1
SUT DVC 2 QUILL PDO T11 36X36 (SUTURE) ×1 IMPLANT
SUT VIC AB 2-0 CT1 18 (SUTURE) ×2 IMPLANT
SYR 20ML LL LF (SYRINGE) ×2 IMPLANT
TIP FAN IRRIG PULSAVAC PLUS (DISPOSABLE) ×1 IMPLANT

## 2020-04-09 NOTE — Anesthesia Preprocedure Evaluation (Addendum)
Anesthesia Evaluation  Patient identified by MRN, date of birth, ID band Patient awake    Reviewed: Allergy & Precautions, H&P , NPO status , Patient's Chart, lab work & pertinent test results  History of Anesthesia Complications Negative for: history of anesthetic complications  Airway Mallampati: II  TM Distance: >3 FB     Dental  (+) Upper Dentures   Pulmonary neg pulmonary ROS, neg sleep apnea, neg COPD,    breath sounds clear to auscultation       Cardiovascular hypertension, (-) angina(-) Past MI and (-) Cardiac Stents (-) dysrhythmias  Rhythm:regular Rate:Normal  Poorly controlled HTN.  224/68 in pre-op.  Pt's daughters report pt's blood pressure is "always high."  Denies CP or H/A.   Neuro/Psych PSYCHIATRIC DISORDERS Depression Dementia peripheral neuropathy negative neurological ROS     GI/Hepatic negative GI ROS, Neg liver ROS,   Endo/Other  negative endocrine ROS  Renal/GU      Musculoskeletal   Abdominal   Peds  Hematology negative hematology ROS (+)   Anesthesia Other Findings Past Medical History: No date: Depression No date: Glucose intolerance (impaired glucose tolerance) No date: Hyperlipidemia No date: Hypertension No date: Ovarian failure  Past Surgical History: 03/03/2018: HIP ARTHROPLASTY; Left     Comment:  Procedure: ARTHROPLASTY BIPOLAR HIP               (HEMIARTHROPLASTY)-LEFT;  Surgeon: Deeann Saint, MD;                Location: [facility] ORS;  Service: Orthopedics;  Laterality:               Left; No date: NO PAST SURGERIES  BMI    Body Mass Index: 27.79 kg/m      Reproductive/Obstetrics negative OB ROS                            Anesthesia Physical Anesthesia Plan  ASA: III  Anesthesia Plan: Spinal   Post-op Pain Management:    Induction:   PONV Risk Score and Plan: Propofol infusion and Ondansetron  Airway Management Planned:   Additional  Equipment:   Intra-op Plan:   Post-operative Plan:   Informed Consent: I have reviewed the patients History and Physical, chart, labs and discussed the procedure including the risks, benefits and alternatives for the proposed anesthesia with the patient or authorized representative who has indicated his/her understanding and acceptance.     Dental Advisory Given  Plan Discussed with: Anesthesiologist, CRNA and Surgeon  Anesthesia Plan Comments: (Consent obtained from patient and pt's daughters at bedside.  KR)        Anesthesia Quick Evaluation

## 2020-04-09 NOTE — Transfer of Care (Signed)
Immediate Anesthesia Transfer of Care Note  Patient: Diane Rogers  Procedure(s) Performed: ARTHROPLASTY BIPOLAR HIP (HEMIARTHROPLASTY) (Right Hip)  Patient Location: PACU  Anesthesia Type:Spinal  Level of Consciousness: awake  Airway & Oxygen Therapy: Patient Spontanous Breathing and Patient connected to nasal cannula oxygen  Post-op Assessment: Report given to RN and Post -op Vital signs reviewed and stable  Post vital signs: Reviewed and stable    Last Vitals:  Vitals Value Taken Time  BP 111/41 04/09/20 1810  Temp    Pulse 64 04/09/20 1811  Resp 14 04/09/20 1812  SpO2 99 % 04/09/20 1811  Vitals shown include unvalidated device data.  Last Pain:  Vitals:   04/09/20 1135  TempSrc: Oral  PainSc:          Complications: No complications documented.

## 2020-04-09 NOTE — H&P (Signed)
The patient has been re-examined, and the chart reviewed, and there have been no interval changes to the documented history and physical.  Plan a right hip hemiarthroplasty today.  Anesthesia is not consulted regarding a peripheral nerve block for post-operative pain.  The risks, benefits, and alternatives have been discussed at length, and the patient is willing to proceed.     

## 2020-04-09 NOTE — Progress Notes (Signed)
Rechecked patients BP it is now 184/78

## 2020-04-09 NOTE — Progress Notes (Addendum)
Progress Note    Diane Rogers  QBH:[id number] DOB: 1928-02-27  DOA: 04/07/2020 PCP: [facility] [place] [place]      Brief Narrative:    Medical records reviewed and are as summarized below:  Diane Rogers is a 88 y.o. female with medical history significant of hypertension, hyperlipidemia, depression, impaired glucose tolerance presented to the ED with complaints of progressively worsening right hip pain.  She said she sustained a fall on 03/29/2020.  She presented to the ED on 04/07/2020.  She was found to have acute right femoral neck fracture.   Assessment/Plan:   Principal Problem:   Closed right hip fracture (HCC) Active Problems:   Essential hypertension, benign   Lower extremity edema   Dyslipidemia   Anemia of chronic disease   Vitamin D deficiency   Peripheral neuropathy   Nutrition Problem: Increased nutrient needs Etiology: post-op healing  Signs/Symptoms: estimated needs   Body mass index is 27.79 kg/m.     Closed right hip fracture s/p mechanical fall: Analgesics as needed for pain.  Plan for right hip surgery today.  Follow-up with orthopedic surgeon for further management.  Hypertension/Hypertensive urgency: She has Lotrel listed on her admission med rec but she has not been taking it.  Start Amlodipine  CKD stage IIIa: Creatinine stable.      Diet Order            Diet NPO time specified Except for: Sips with Meds  Diet effective now                    Consultants:  Orthopedic surgeon  Procedures:  Plan for right hip repair today    Medications:   . Chlorhexidine Gluconate Cloth  6 each Topical Daily  . [START ON 04/10/2020] feeding supplement  237 mL Oral BID BM  .  morphine injection  2 mg Intravenous Once  . [START ON 04/10/2020] multivitamin with minerals  1 tablet Oral Daily  . senna  1 tablet Oral BID  . tranexamic acid (CYKLOKAPRON) topical - INTRAOP  2,000 mg Topical Once   Continuous  Infusions: . sodium chloride 1,000 mL (04/09/20 0951)  . cefTRIAXone (ROCEPHIN)  IV 1 g (04/09/20 0973)     Anti-infectives (From admission, onward)   Start     Dose/Rate Route Frequency Ordered Stop   04/08/20 1000  cefTRIAXone (ROCEPHIN) 1 g in sodium chloride 0.9 % 100 mL IVPB        1 g 200 mL/hr over 30 Minutes Intravenous Every 24 hours 04/08/20 0853               Family Communication/Anticipated D/C date and plan/Code Status   DVT prophylaxis: SCDs Start: 04/07/20 1727     Code Status: Full Code  Family Communication: None Disposition Plan:    Status is: Inpatient  Remains inpatient appropriate because:Unsafe d/c plan   Dispo: The patient is from: Home              Anticipated d/c is to: SNF              Patient currently is not medically stable to d/c.   Difficult to place patient No           Subjective:   C/o right hip pain  Objective:    Vitals:   04/09/20 0418 04/09/20 0749 04/09/20 1135 04/09/20 1139  BP: (!) 163/61 (!) 166/79 (!) 143/121 (!) 184/78  Pulse: 82 72 73 76  Resp: 16 18 18    Temp: 97.7 F (36.5 C) 98.7 F (37.1 C) 99.1 F (37.3 C)   TempSrc:  Oral Oral   SpO2: 97% 100% 96%   Weight:      Height:       No data found.   Intake/Output Summary (Last 24 hours) at 04/09/2020 1250 Last data filed at 04/08/2020 2041 Gross per 24 hour  Intake 242.69 ml  Output 375 ml  Net -132.31 ml   Filed Weights   04/07/20 1402 04/07/20 2052  Weight: 64 kg 66.7 kg    Exam:  GEN: NAD SKIN: No rash EYES: EOMI ENT: MMM CV: RRR PULM: CTA B ABD: soft, ND, NT, +BS CNS: AAO x 3, non focal EXT: Mild right hip swelling and tenderness without erythema        Data Reviewed:   I have personally reviewed following labs and imaging studies:  Labs: Labs show the following:   Basic Metabolic Panel: Recent Labs  Lab 04/07/20 1629 04/08/20 0514  NA 136 140  K 4.1 4.1  CL 102 105  CO2 25 28  GLUCOSE 105* 103*  BUN 21  23  CREATININE 1.05* 0.96  CALCIUM 8.9 8.6*   GFR Estimated Creatinine Clearance: 34.7 mL/min (by C-G formula based on SCr of 0.96 mg/dL). Liver Function Tests: No results for input(s): AST, ALT, ALKPHOS, BILITOT, PROT, ALBUMIN in the last 168 hours. No results for input(s): LIPASE, AMYLASE in the last 168 hours. No results for input(s): AMMONIA in the last 168 hours. Coagulation profile Recent Labs  Lab 04/07/20 1629  INR 1.1    CBC: Recent Labs  Lab 04/07/20 1629 04/08/20 0514 04/09/20 0429  WBC 15.0* 12.2* 12.3*  NEUTROABS 12.4*  --   --   HGB 14.8 13.7 13.8  HCT 43.4 41.7 43.1  MCV 92.3 93.1 93.3  PLT 254 231 250   Cardiac Enzymes: No results for input(s): CKTOTAL, CKMB, CKMBINDEX, TROPONINI in the last 168 hours. BNP (last 3 results) No results for input(s): PROBNP in the last 8760 hours. CBG: No results for input(s): GLUCAP in the last 168 hours. D-Dimer: No results for input(s): DDIMER in the last 72 hours. Hgb A1c: No results for input(s): HGBA1C in the last 72 hours. Lipid Profile: No results for input(s): CHOL, HDL, LDLCALC, TRIG, CHOLHDL, LDLDIRECT in the last 72 hours. Thyroid function studies: No results for input(s): TSH, T4TOTAL, T3FREE, THYROIDAB in the last 72 hours.  Invalid input(s): FREET3 Anemia work up: No results for input(s): VITAMINB12, FOLATE, FERRITIN, TIBC, IRON, RETICCTPCT in the last 72 hours. Sepsis Labs: Recent Labs  Lab 04/07/20 1629 04/08/20 0514 04/09/20 0429  WBC 15.0* 12.2* 12.3*    Microbiology Recent Results (from the past 240 hour(s))  Resp Panel by RT-PCR (Flu A&B, Covid) Nasopharyngeal Swab     Status: None   Collection Time: 04/07/20  5:16 PM   Specimen: Nasopharyngeal Swab; Nasopharyngeal(NP) swabs in vial transport medium  Result Value Ref Range Status   SARS Coronavirus 2 by RT PCR NEGATIVE NEGATIVE Final    Comment: (NOTE) SARS-CoV-2 target nucleic acids are NOT DETECTED.  The SARS-CoV-2 RNA is generally  detectable in upper respiratory specimens during the acute phase of infection. The lowest concentration of SARS-CoV-2 viral copies this assay can detect is 138 copies/mL. A negative result does not preclude SARS-Cov-2 infection and should not be used as the sole basis for treatment or other patient management decisions. A negative result may occur with  improper specimen  collection/handling, submission of specimen other than nasopharyngeal swab, presence of viral mutation(s) within the areas targeted by this assay, and inadequate number of viral copies(<138 copies/mL). A negative result must be combined with clinical observations, patient history, and epidemiological information. The expected result is Negative.  Fact Sheet for Patients:  BloggerCourse.com  Fact Sheet for Healthcare Providers:  SeriousBroker.it  This test is no t yet approved or cleared by the [place] FDA and  has been authorized for detection and/or diagnosis of SARS-CoV-2 by FDA under an Emergency Use Authorization (EUA). This EUA will remain  in effect (meaning this test can be used) for the duration of the COVID-19 declaration under Section 564(b)(1) of the Act, 21 U.S.C.section 360bbb-3(b)(1), unless the authorization is terminated  or revoked sooner.       Influenza A by PCR NEGATIVE NEGATIVE Final   Influenza B by PCR NEGATIVE NEGATIVE Final    Comment: (NOTE) The Xpert Xpress SARS-CoV-2/FLU/RSV plus assay is intended as an aid in the diagnosis of influenza from Nasopharyngeal swab specimens and should not be used as a sole basis for treatment. Nasal washings and aspirates are unacceptable for Xpert Xpress SARS-CoV-2/FLU/RSV testing.  Fact Sheet for Patients: BloggerCourse.com  Fact Sheet for Healthcare Providers: SeriousBroker.it  This test is not yet approved or cleared by the [place] FDA  and has been authorized for detection and/or diagnosis of SARS-CoV-2 by FDA under an Emergency Use Authorization (EUA). This EUA will remain in effect (meaning this test can be used) for the duration of the COVID-19 declaration under Section 564(b)(1) of the Act, 21 U.S.C. section 360bbb-3(b)(1), unless the authorization is terminated or revoked.  Performed at [facility], [address], [place], [place] [address]   Surgical pcr screen     Status: None   Collection Time: 04/08/20  1:33 AM   Specimen: Nasal Mucosa; Nasal Swab  Result Value Ref Range Status   MRSA, PCR NEGATIVE NEGATIVE Final   Staphylococcus aureus NEGATIVE NEGATIVE Final    Comment: (NOTE) The Xpert SA Assay (FDA approved for NASAL specimens in patients 89 years of age and older), is one component of a comprehensive surveillance program. It is not intended to diagnose infection nor to guide or monitor treatment. Performed at [facility], [address] [place], [place] [address]   Urine Culture     Status: Abnormal   Collection Time: 04/08/20  3:30 AM   Specimen: Urine, Random  Result Value Ref Range Status   Specimen Description   Final    URINE, RANDOM Performed at [facility], [address] [place], [place] [address]    Special Requests   Final    NONE Performed at [facility], [address] [place], [place] [address]    Culture MULTIPLE SPECIES PRESENT, SUGGEST RECOLLECTION (A)  Final   Report Status 04/09/2020 FINAL  Final    Procedures and diagnostic studies:  DG Chest 1 View  Result Date: 04/07/2020 CLINICAL DATA:  Larey Seat 2 days ago EXAM: CHEST  1 VIEW COMPARISON:  03/02/2018 FINDINGS: Mild cardiomegaly with aortic atherosclerosis. No focal opacity or pleural effusion. No pneumothorax. IMPRESSION: No active disease. Mild cardiomegaly. Electronically Signed   By: Jasmine Pang M.D.   On: 04/07/2020 15:11   DG Pelvis 1-2 Views  Result Date:  04/07/2020 CLINICAL DATA:  Fall with hip pain EXAM: PELVIS - 1-2 VIEW COMPARISON:  03/03/2018 FINDINGS: Previous left hip replacement with normal alignment. Pubic symphysis and rami appear intact. Acute  mildly displaced right femoral neck fracture. IMPRESSION: Acute mildly displaced right femoral neck fracture. Electronically Signed   By: Jasmine Pang M.D.   On: 04/07/2020 15:09   DG Femur Min 2 Views Right  Result Date: 04/07/2020 CLINICAL DATA:  Right-sided hip pain after fall 2 days ago EXAM: RIGHT FEMUR 2 VIEWS COMPARISON:  03/03/2018 FINDINGS: Acute displaced right femoral neck fracture. Femoral head projects in joint. The remainder of the right femur is intact. There are vascular calcifications. IMPRESSION: Acute displaced right femoral neck fracture. Electronically Signed   By: Jasmine Pang M.D.   On: 04/07/2020 15:09               LOS: 2 days   Jordanny Waddington  [facility]   Pager on [email or web address]. If 7PM-7AM, please contact night-coverage at [email or web address]     04/09/2020, 12:50 PM

## 2020-04-09 NOTE — Anesthesia Procedure Notes (Signed)
Spinal  Start time: 04/09/2020 4:45 PM End time: 04/09/2020 4:53 PM Staffing Performed: resident/CRNA  Anesthesiologist: Yevette Edwards, MD Resident/CRNA: Elmarie Mainland, CRNA Preanesthetic Checklist Completed: patient identified, IV checked, site marked, risks and benefits discussed, surgical consent, monitors and equipment checked, pre-op evaluation and timeout performed Spinal Block Patient position: sitting Prep: ChloraPrep Patient monitoring: continuous pulse ox and blood pressure Approach: midline Location: L3-4 Injection technique: single-shot Needle Needle type: Pencan  Needle gauge: 24 G Assessment Sensory level: T10 Additional Notes Neg paresthesia, negative heme

## 2020-04-09 NOTE — Op Note (Signed)
04/07/2020 - 04/09/2020  5:59 PM  PATIENT:  Diane Rogers   MRN: [id number]  PRE-OPERATIVE DIAGNOSIS:  Displaced Subcapital fracture right hip   POST-OPERATIVE DIAGNOSIS: Same  Procedure: Right Hip Anterior Hip Hemiarthroplasty   Surgeon: Dola Argyle. Odis Luster, MD   Assist: Altamese Cabal, PA-C  Anesthesia: Spinal   EBL: 5 mL   Specimens: None   Drains: None   Components used: A size 3 Polarstem [employer], a 43 mm bipolar head    Description of the procedure in detail: After informed consent was obtained and the appropriate extremity marked in the pre-operative holding area, the patient was taken to the operating room and placed in the supine position on the fracture table. All pressure points were well padded and bilateral lower extremities were place in traction spars. The hip was prepped and draped in standard sterile fashion. A spinal anesthetic had been delivered by the anesthesia team. The skin and subcutaneous tissues were injected with a mixture of Marcaine with epinephrine for post-operative pain. A longitudinal incision approximately 10 cm in length was carried out from the anterior superior iliac spine to the greater trochanter. The tensor fascia was divided and blunt dissection was taken down to the level of the joint capsule. The lateral circumflex vessels were cauterized. Deep retractors were placed and a portion of the anterior capsule was excised. Using fluoroscopy the neck cut was planned and carried out with a sagittal saw. The head was passed from the field with use of a corkscrew and hip skid. Deep retractors were placed along the acetabulum and bony and soft tissue debris was removed.   Attention was then turned to the proximal femur. The leg was placed in extension and external rotation. The canal was opened and sequentially broached to a size 3. The trial components were placed and the hip relocated. The components were found to be in good position using  fluoroscopy. The hip was dislocated and the trial components removed. The final components were impacted in to position and the hip relocated. The final components were again check with fluoroscopy and found to be in good position. Hemostasis was achieved with electrocautery. The deep capsule was injected with Marcaine and epinephrine. The wound was irrigated with bacitracin laced normal saline and the tensor fascia closed with #2 Quill suture. The subcutaneous tissues were closed with 2-0 vicryl and staples for the skin. A sterile dressing was applied and an abduction pillow. Patient tolerated the procedure well and there were no apparent complication. Patient was taken to the recovery room in good condition.    Dola Argyle. Odis Luster, MD  04/09/2020 5:59 PM

## 2020-04-09 NOTE — Progress Notes (Signed)
PATIENT SHAKING/ TEMP IS 97.8. STILL SHAKING AFTER PUTTING ON WARM BLANKETS. PAGED DR. ADAMS AND ONE TIME ORDER FOR DEMEROL 12.5 MG GIVEN.

## 2020-04-10 ENCOUNTER — Encounter: Payer: Self-pay | Admitting: Orthopedic Surgery

## 2020-04-10 DIAGNOSIS — S72001D Fracture of unspecified part of neck of right femur, subsequent encounter for closed fracture with routine healing: Secondary | ICD-10-CM | POA: Diagnosis not present

## 2020-04-10 LAB — CBC WITH DIFFERENTIAL/PLATELET
Abs Immature Granulocytes: 0.05 10*3/uL (ref 0.00–0.07)
Basophils Absolute: 0.1 10*3/uL (ref 0.0–0.1)
Basophils Relative: 1 %
Eosinophils Absolute: 0.1 10*3/uL (ref 0.0–0.5)
Eosinophils Relative: 1 %
HCT: 36.9 % (ref 36.0–46.0)
Hemoglobin: 12.3 g/dL (ref 12.0–15.0)
Immature Granulocytes: 0 %
Lymphocytes Relative: 7 %
Lymphs Abs: 0.9 10*3/uL (ref 0.7–4.0)
MCH: 30.9 pg (ref 26.0–34.0)
MCHC: 33.3 g/dL (ref 30.0–36.0)
MCV: 92.7 fL (ref 80.0–100.0)
Monocytes Absolute: 1 10*3/uL (ref 0.1–1.0)
Monocytes Relative: 8 %
Neutro Abs: 11 10*3/uL — ABNORMAL HIGH (ref 1.7–7.7)
Neutrophils Relative %: 83 %
Platelets: 199 10*3/uL (ref 150–400)
RBC: 3.98 MIL/uL (ref 3.87–5.11)
RDW: 14 % (ref 11.5–15.5)
WBC: 13.2 10*3/uL — ABNORMAL HIGH (ref 4.0–10.5)
nRBC: 0 % (ref 0.0–0.2)

## 2020-04-10 MED ORDER — ASPIRIN EC 81 MG PO TBEC
81.0000 mg | DELAYED_RELEASE_TABLET | Freq: Two times a day (BID) | ORAL | Status: DC
  Administered 2020-04-10 – 2020-04-14 (×9): 81 mg via ORAL
  Filled 2020-04-10 (×9): qty 1

## 2020-04-10 NOTE — Anesthesia Postprocedure Evaluation (Signed)
Anesthesia Post Note  Patient: Diane Rogers  Procedure(s) Performed: ARTHROPLASTY BIPOLAR HIP (HEMIARTHROPLASTY) (Right Hip)  Patient location during evaluation: Nursing Unit Anesthesia Type: Spinal Level of consciousness: awake and alert and oriented Pain management: pain level controlled Vital Signs Assessment: post-procedure vital signs reviewed and stable Respiratory status: spontaneous breathing and respiratory function stable Cardiovascular status: stable Postop Assessment: no headache, no backache, no apparent nausea or vomiting, patient able to bend at knees, able to ambulate and adequate PO intake Anesthetic complications: no   No complications documented.   Last Vitals:  Vitals:   04/09/20 2329 04/10/20 0356  BP: (!) 142/51 (!) 154/55  Pulse: 86 79  Resp: 16 16  Temp: 36.8 C 36.8 C  SpO2: 94% 95%    Last Pain:  Vitals:   04/09/20 2329  TempSrc: Oral  PainSc:                  Zachary George

## 2020-04-10 NOTE — Progress Notes (Addendum)
Progress Note    Diane Rogers  TGY:[id number] DOB: 09-07-1928  DOA: 04/07/2020 PCP: [facility]      Brief Narrative:    Medical records reviewed and are as summarized below:  Diane Rogers is a 87 y.o. female with medical history significant of hypertension, hyperlipidemia, depression, impaired glucose tolerance presented to the ED with complaints of progressively worsening right hip pain.  She said she sustained a fall on 03/29/2020.  She presented to the ED on 04/07/2020.  She was found to have acute right femoral neck fracture.   Assessment/Plan:   Principal Problem:   Closed right hip fracture (HCC) Active Problems:   Essential hypertension, benign   Lower extremity edema   Dyslipidemia   Anemia of chronic disease   Vitamin D deficiency   Peripheral neuropathy   Nutrition Problem: Increased nutrient needs Etiology: post-op healing  Signs/Symptoms: estimated needs   Body mass index is 27.79 kg/m.     Closed right hip fracture s/p mechanical fall: S/p right hip fracture-gastroplasty 04/09/2020.  Continue analgesics as needed for pain.  Follow-up with orthopedic surgeon.   Hypertension/Hypertensive urgency: Continue amlodipine  CKD stage IIIa: Creatinine stable.  Abnormal urinalysis: Urine culture showed multiple species.  She has already received 3 doses of IV Rocephin. Discontinue IV Rocephin.    Diet Order            Diet regular Room service appropriate? Yes; Fluid consistency: Thin  Diet effective now                    Consultants:  Orthopedic surgeon  Procedures:  Right hip anterior hip hemiarthroplasty on 04/09/2020    Medications:   . amLODipine  5 mg Oral Daily  . aspirin EC  81 mg Oral BID  . Chlorhexidine Gluconate Cloth  6 each Topical Daily  . docusate sodium  100 mg Oral BID  . feeding supplement  237 mL Oral BID BM  . ketorolac  7.5 mg Intravenous Q6H  .  morphine injection  2 mg  Intravenous Once  . multivitamin with minerals  1 tablet Oral Daily  . senna  1 tablet Oral BID  . traMADol  50 mg Oral Q6H  . tranexamic acid (CYKLOKAPRON) topical - INTRAOP  2,000 mg Topical Once   Continuous Infusions: . sodium chloride 10 mL/hr at 04/10/20 0554  . lactated ringers Stopped (04/09/20 2332)     Anti-infectives (From admission, onward)   Start     Dose/Rate Route Frequency Ordered Stop   04/08/20 1000  cefTRIAXone (ROCEPHIN) 1 g in sodium chloride 0.9 % 100 mL IVPB  Status:  Discontinued        1 g 200 mL/hr over 30 Minutes Intravenous Every 24 hours 04/08/20 0853 04/10/20 1135             Family Communication/Anticipated D/C date and plan/Code Status   DVT prophylaxis: SCDs Start: 04/09/20 2002 SCDs Start: 04/07/20 1727     Code Status: Full Code  Family Communication: Discussed with her daughter, Bonita Quin, at the bedside Disposition Plan:    Status is: Inpatient  Remains inpatient appropriate because:Unsafe d/c plan   Dispo: The patient is from: Home              Anticipated d/c is to: Home              Patient currently is not medically stable to d/c.   Difficult to place patient No  Subjective:   C/o hip pain.  Her daughter, Bonita Quin, is at the bedside.  Her nurse was also at the bedside.  Objective:    Vitals:   04/09/20 2329 04/10/20 0356 04/10/20 0753 04/10/20 1120  BP: (!) 142/51 (!) 154/55 (!) 160/61 140/63  Pulse: 86 79 82 96  Resp: 16 16 19 19   Temp: 98.3 F (36.8 C) 98.3 F (36.8 C) 98 F (36.7 C) (!) 97.3 F (36.3 C)  TempSrc: Oral  Oral Oral  SpO2: 94% 95% 96% 96%  Weight:      Height:       No data found.   Intake/Output Summary (Last 24 hours) at 04/10/2020 1459 Last data filed at 04/10/2020 1401 Gross per 24 hour  Intake 2144.98 ml  Output 850 ml  Net 1294.98 ml   Filed Weights   04/07/20 1402 04/07/20 2052 04/09/20 1408  Weight: 64 kg 66.7 kg 66.7 kg    Exam:  GEN: NAD SKIN: Warm and  dry EYES: No pallor or icterus ENT: MMM CV: RRR PULM: CTA B ABD: soft, ND, NT, +BS CNS: AAO x 3, non focal EXT: Dressing on right hip surgical wound is clean, dry and intact.       Data Reviewed:   I have personally reviewed following labs and imaging studies:  Labs: Labs show the following:   Basic Metabolic Panel: Recent Labs  Lab 04/07/20 1629 04/08/20 0514  NA 136 140  K 4.1 4.1  CL 102 105  CO2 25 28  GLUCOSE 105* 103*  BUN 21 23  CREATININE 1.05* 0.96  CALCIUM 8.9 8.6*   GFR Estimated Creatinine Clearance: 34.7 mL/min (by C-G formula based on SCr of 0.96 mg/dL). Liver Function Tests: No results for input(s): AST, ALT, ALKPHOS, BILITOT, PROT, ALBUMIN in the last 168 hours. No results for input(s): LIPASE, AMYLASE in the last 168 hours. No results for input(s): AMMONIA in the last 168 hours. Coagulation profile Recent Labs  Lab 04/07/20 1629  INR 1.1    CBC: Recent Labs  Lab 04/07/20 1629 04/08/20 0514 04/09/20 0429 04/10/20 0618  WBC 15.0* 12.2* 12.3* 13.2*  NEUTROABS 12.4*  --   --  11.0*  HGB 14.8 13.7 13.8 12.3  HCT 43.4 41.7 43.1 36.9  MCV 92.3 93.1 93.3 92.7  PLT 254 231 250 199   Cardiac Enzymes: No results for input(s): CKTOTAL, CKMB, CKMBINDEX, TROPONINI in the last 168 hours. BNP (last 3 results) No results for input(s): PROBNP in the last 8760 hours. CBG: No results for input(s): GLUCAP in the last 168 hours. D-Dimer: No results for input(s): DDIMER in the last 72 hours. Hgb A1c: No results for input(s): HGBA1C in the last 72 hours. Lipid Profile: No results for input(s): CHOL, HDL, LDLCALC, TRIG, CHOLHDL, LDLDIRECT in the last 72 hours. Thyroid function studies: No results for input(s): TSH, T4TOTAL, T3FREE, THYROIDAB in the last 72 hours.  Invalid input(s): FREET3 Anemia work up: No results for input(s): VITAMINB12, FOLATE, FERRITIN, TIBC, IRON, RETICCTPCT in the last 72 hours. Sepsis Labs: Recent Labs  Lab  04/07/20 1629 04/08/20 0514 04/09/20 0429 04/10/20 0618  WBC 15.0* 12.2* 12.3* 13.2*    Microbiology Recent Results (from the past 240 hour(s))  Resp Panel by RT-PCR (Flu A&B, Covid) Nasopharyngeal Swab     Status: None   Collection Time: 04/07/20  5:16 PM   Specimen: Nasopharyngeal Swab; Nasopharyngeal(NP) swabs in vial transport medium  Result Value Ref Range Status   SARS Coronavirus 2 by RT PCR  NEGATIVE NEGATIVE Final    Comment: (NOTE) SARS-CoV-2 target nucleic acids are NOT DETECTED.  The SARS-CoV-2 RNA is generally detectable in upper respiratory specimens during the acute phase of infection. The lowest concentration of SARS-CoV-2 viral copies this assay can detect is 138 copies/mL. A negative result does not preclude SARS-Cov-2 infection and should not be used as the sole basis for treatment or other patient management decisions. A negative result may occur with  improper specimen collection/handling, submission of specimen other than nasopharyngeal swab, presence of viral mutation(s) within the areas targeted by this assay, and inadequate number of viral copies(<138 copies/mL). A negative result must be combined with clinical observations, patient history, and epidemiological information. The expected result is Negative.  Fact Sheet for Patients:  BloggerCourse.com  Fact Sheet for Healthcare Providers:  SeriousBroker.it  This test is no t yet approved or cleared by the [place] FDA and  has been authorized for detection and/or diagnosis of SARS-CoV-2 by FDA under an Emergency Use Authorization (EUA). This EUA will remain  in effect (meaning this test can be used) for the duration of the COVID-19 declaration under Section 564(b)(1) of the Act, 21 U.S.C.section 360bbb-3(b)(1), unless the authorization is terminated  or revoked sooner.       Influenza A by PCR NEGATIVE NEGATIVE Final   Influenza B by PCR  NEGATIVE NEGATIVE Final    Comment: (NOTE) The Xpert Xpress SARS-CoV-2/FLU/RSV plus assay is intended as an aid in the diagnosis of influenza from Nasopharyngeal swab specimens and should not be used as a sole basis for treatment. Nasal washings and aspirates are unacceptable for Xpert Xpress SARS-CoV-2/FLU/RSV testing.  Fact Sheet for Patients: BloggerCourse.com  Fact Sheet for Healthcare Providers: SeriousBroker.it  This test is not yet approved or cleared by the [place] FDA and has been authorized for detection and/or diagnosis of SARS-CoV-2 by FDA under an Emergency Use Authorization (EUA). This EUA will remain in effect (meaning this test can be used) for the duration of the COVID-19 declaration under Section 564(b)(1) of the Act, 21 U.S.C. section 360bbb-3(b)(1), unless the authorization is terminated or revoked.  Performed at [facility], [address], [place], [place] [address]   Surgical pcr screen     Status: None   Collection Time: 04/08/20  1:33 AM   Specimen: Nasal Mucosa; Nasal Swab  Result Value Ref Range Status   MRSA, PCR NEGATIVE NEGATIVE Final   Staphylococcus aureus NEGATIVE NEGATIVE Final    Comment: (NOTE) The Xpert SA Assay (FDA approved for NASAL specimens in patients 64 years of age and older), is one component of a comprehensive surveillance program. It is not intended to diagnose infection nor to guide or monitor treatment. Performed at [facility], [address] [place], [place] [address]   Urine Culture     Status: Abnormal   Collection Time: 04/08/20  3:30 AM   Specimen: Urine, Random  Result Value Ref Range Status   Specimen Description   Final    URINE, RANDOM Performed at [facility], [address] [place], [place] [address]    Special Requests   Final    NONE Performed at [facility], [address] [place], [place] [address]     Culture MULTIPLE SPECIES PRESENT, SUGGEST RECOLLECTION (A)  Final   Report Status 04/09/2020 FINAL  Final    Procedures and diagnostic studies:  DG HIP OPERATIVE UNILAT W OR W/O PELVIS RIGHT  Result Date: 04/09/2020 CLINICAL DATA:  RIGHT hip  replacement EXAM: OPERATIVE RIGHT HIP (WITH PELVIS IF PERFORMED)  VIEWS TECHNIQUE: Fluoroscopic spot image(s) were submitted for interpretation post-operatively. COMPARISON:  None. FINDINGS: Intraoperative fluoroscopic images demonstrating RIGHT hip arthroplasty. Hardware appears intact and appropriately position. Fluoroscopy was provided for 7 seconds. IMPRESSION: Intraoperative fluoroscopic images of the RIGHT hip arthroplasty. No evidence of surgical complicating feature. Electronically Signed   By: Bary RichardStan  Maynard M.D.   On: 04/09/2020 18:32               LOS: 3 days   Briannah Lona  [facility]   Pager on [email or web address]. If 7PM-7AM, please contact night-coverage at [email or web address]     04/10/2020, 2:59 PM

## 2020-04-10 NOTE — Care Management Important Message (Signed)
Important Message  Patient Details  Name: Diane Rogers MRN: [id number] Date of Birth: 22-Jun-1928   Medicare Important Message Given:  Yes     [name] A Blayklee Mable 04/10/2020, 11:57 AM

## 2020-04-10 NOTE — Progress Notes (Signed)
  Subjective:  Patient reports pain as mild to moderate.    Objective:   VITALS:   Vitals:   04/09/20 1915 04/09/20 2146 04/09/20 2329 04/10/20 0356  BP: (!) 160/87 (!) 177/66 (!) 142/51 (!) 154/55  Pulse: 60 79 86 79  Resp: 13 16 16 16   Temp:  97.8 F (36.6 C) 98.3 F (36.8 C) 98.3 F (36.8 C)  TempSrc:  Oral Oral   SpO2: 98% 97% 94% 95%  Weight:      Height:        PHYSICAL EXAM:  Neurologically intact ABD soft Neurovascular intact Sensation intact distally Intact pulses distally Dorsiflexion/Plantar flexion intact Incision: moderate drainage and dressing changed No cellulitis present Compartment soft  LABS  Results for orders placed or performed during the hospital encounter of 04/07/20 (from the past 24 hour(s))  CBC with Differential/Platelet     Status: Abnormal   Collection Time: 04/10/20  6:18 AM  Result Value Ref Range   WBC 13.2 (H) 4.0 - 10.5 K/uL   RBC 3.98 3.87 - 5.11 MIL/uL   Hemoglobin 12.3 12.0 - 15.0 g/dL   HCT 06/10/20 46.9 - 62.9 %   MCV 92.7 80.0 - 100.0 fL   MCH 30.9 26.0 - 34.0 pg   MCHC 33.3 30.0 - 36.0 g/dL   RDW 52.8 41.3 - 24.4 %   Platelets 199 150 - 400 K/uL   nRBC 0.0 0.0 - 0.2 %   Neutrophils Relative % 83 %   Neutro Abs 11.0 (H) 1.7 - 7.7 K/uL   Lymphocytes Relative 7 %   Lymphs Abs 0.9 0.7 - 4.0 K/uL   Monocytes Relative 8 %   Monocytes Absolute 1.0 0.1 - 1.0 K/uL   Eosinophils Relative 1 %   Eosinophils Absolute 0.1 0.0 - 0.5 K/uL   Basophils Relative 1 %   Basophils Absolute 0.1 0.0 - 0.1 K/uL   Immature Granulocytes 0 %   Abs Immature Granulocytes 0.05 0.00 - 0.07 K/uL    DG HIP OPERATIVE UNILAT W OR W/O PELVIS RIGHT  Result Date: 04/09/2020 CLINICAL DATA:  RIGHT hip replacement EXAM: OPERATIVE RIGHT HIP (WITH PELVIS IF PERFORMED)  VIEWS TECHNIQUE: Fluoroscopic spot image(s) were submitted for interpretation post-operatively. COMPARISON:  None. FINDINGS: Intraoperative fluoroscopic images demonstrating RIGHT hip  arthroplasty. Hardware appears intact and appropriately position. Fluoroscopy was provided for 7 seconds. IMPRESSION: Intraoperative fluoroscopic images of the RIGHT hip arthroplasty. No evidence of surgical complicating feature. Electronically Signed   By: 06/09/2020 M.D.   On: 04/09/2020 18:32    Assessment/Plan: 1 Day Post-Op   Principal Problem:   Closed right hip fracture (HCC) Active Problems:   Essential hypertension, benign   Lower extremity edema   Dyslipidemia   Anemia of chronic disease   Vitamin D deficiency   Peripheral neuropathy   Advance diet Up with therapy  Aspirin 81 mg BID for 30 days Hemoglobin stable WBAT in RLE Discharge per hospitalist Follow up in 2 weeks for staple removal.   06/09/2020 , PA-C 04/10/2020, 6:50 AM

## 2020-04-10 NOTE — Progress Notes (Signed)
Physical Therapy Treatment Patient Details Name: Diane Rogers MRN: [id number] DOB: 1928/05/14 Today's Date: 04/10/2020    History of Present Illness Pt is an 87 y/o F with PMH that includes HTN, HLD, depression and fall with L hemiarthroplasty in Jan 2020. Pt now presents after sustaining mechanical fall with displaced right femoral neck fracture and is s/p right hip anterior hip hemiarthroplasty.    PT Comments    Pt was pleasant and motivated to participate during the session and made some minimal progress towards goals.  Pt required grossly less physical assistance during the third of three sit to/from stand transfers with extra cuing for increased trunk flexion and RLE force generation.  Pt was able to clear her R foot from the floor on one occasion this session but was unable to clear/advance her LLE.  Pt's SpO2 and HR were both WNL during the session.  Pt will benefit from PT services in a SNF setting upon discharge to safely address deficits listed in patient problem list for decreased caregiver assistance and eventual return to PLOF.     Follow Up Recommendations  SNF;Supervision/Assistance - 24 hour     Equipment Recommendations  3in1 (PT)    Recommendations for Other Services       Precautions / Restrictions Precautions Precautions: Fall;Anterior Hip Precaution Booklet Issued: Yes (comment) Restrictions Weight Bearing Restrictions: Yes RLE Weight Bearing: Weight bearing as tolerated    Mobility  Bed Mobility Overal bed mobility: Needs Assistance Bed Mobility: Supine to Sit;Sit to Supine     Supine to sit: Mod assist;Max assist Sit to supine: Mod assist;Max assist   General bed mobility comments: Mod to max A for BLE and trunk control    Transfers Overall transfer level: Needs assistance Equipment used: Rolling walker (2 wheeled) Transfers: Sit to/from Stand Sit to Stand: From elevated surface;+2 physical assistance;Max assist;Mod assist          General transfer comment: +2 Max A x 2 sit to/from stand transfers and +2 Mod A x 1 to come to standing and to maintain standing position  Ambulation/Gait             General Gait Details: Unable; pt able to lift her L foot from the floor x 1 but unable to advance the LLE   Stairs             Wheelchair Mobility    Modified Rankin (Stroke Patients Only)       Balance Overall balance assessment: Needs assistance   Sitting balance-[name]: Poor Sitting balance - Comments: Occasional min A for sittign balance     Standing balance-[name]: Poor Standing balance comment: constant physical assist to prevent LOB in standing                            Cognition Arousal/Alertness: Awake/alert Behavior During Therapy: WFL for tasks assessed/performed Overall Cognitive Status: Within Functional Limits for tasks assessed                                        Exercises Total Joint Exercises Ankle Circles/Pumps: AROM;Strengthening;Both;10 reps Quad Sets: Strengthening;Both;10 reps Gluteal Sets: Strengthening;Both;10 reps Long Arc Quad: AROM;Strengthening;Both;10 reps Knee Flexion: AROM;Strengthening;Both;10 reps Other Exercises Other Exercises: HEP education with pt and daughter per handout Other Exercises: Satic standing x 3 with cues for increased weight acceptance to the  RLE Other Exercises: Satic standing x 2 with cues for increased weight acceptance to the RLE Other Exercises: Anterior hip precaution education with pt and daughter    General Comments        Pertinent Vitals/Pain Pain Assessment: 0-10 Pain Score: 6  Pain Location: R hip Pain Descriptors / Indicators: Sore Pain Intervention(s): Premedicated before session;Monitored during session    Home Living Family/patient expects to be discharged to:: Private residence Living Arrangements: Children Available Help at Discharge: Family;Available 24 hours/day Type of  Home: House Home Access: Stairs to enter Entrance Stairs-Rails:  (railing in the middle) Home Layout: Multi-level Home Equipment: Cane - single point;Walker - 2 wheels;Shower seat      Prior Function Level of Independence: Independent with assistive device(s)      Comments: Mod Ind amb with a SPC or RW, no other falls in the last 6 months, limited community ambulator, Ind with ALDs   PT Goals (current goals can now be found in the care plan section) Acute Rehab PT Goals Patient Stated Goal: To walk better and get out of the house PT Goal Formulation: With patient Time For Goal Achievement: 04/23/20 Potential to Achieve Goals: Fair Progress towards PT goals: Progressing toward goals    Frequency    BID      PT Plan Current plan remains appropriate    Co-evaluation              AM-PAC PT "6 Clicks" Mobility   Outcome Measure  Help needed turning from your back to your side while in a flat bed without using bedrails?: A Lot Help needed moving from lying on your back to sitting on the side of a flat bed without using bedrails?: A Lot Help needed moving to and from a bed to a chair (including a wheelchair)?: A Lot Help needed standing up from a chair using your arms (e.g., wheelchair or bedside chair)?: A Lot Help needed to walk in hospital room?: Total Help needed climbing 3-5 steps with a railing? : Total 6 Click Score: 10    End of Session Equipment Utilized During Treatment: Gait belt Activity Tolerance: Patient tolerated treatment well Patient left: in bed;with call bell/phone within reach;with bed alarm set;with SCD's reapplied;with family/visitor present Nurse Communication: Mobility status;Weight bearing status PT Visit Diagnosis: Unsteadiness on feet (R26.81);History of falling (Z91.81);Muscle weakness (generalized) (M62.81);Difficulty in walking, not elsewhere classified (R26.2);Pain Pain - Right/Left: Right Pain - part of body: Hip     Time:  6237-6283 PT Time Calculation (min) (ACUTE ONLY): 28 min  Charges:  $Therapeutic Exercise: 8-22 mins $Therapeutic Activity: 8-22 mins                     D. Scott Diane Rogers PT, DPT 04/10/20, 5:02 PM

## 2020-04-10 NOTE — Evaluation (Signed)
Physical Therapy Evaluation Patient Details Name: Diane Rogers MRN: [id number] DOB: Mar 18, 1928 Today's Date: 04/10/2020   History of Present Illness  Pt is an 87 y/o F with PMH that includes HTN, HLD, depression and fall with L hemiarthroplasty in Jan 2020. Pt now presents after sustaining mechanical fall with displaced right femoral neck fracture and is s/p right hip anterior hip hemiarthroplasty.    Clinical Impression  Pt was pleasant and motivated to participate during the session but ultimately was very limited with all functional tasks.  Pt required extensive assistance with bed mobility and transfers and was unable to lift/advance either LE while in standing and required constant physical assistance to prevent LOB.  Pt's SpO2 and HR were WNL on room air during the session with pt reporting no R hip pain at rest and min to mod R hip pain with activity.  Pt will benefit from PT services in a SNF setting upon discharge to safely address deficits listed in patient problem list for decreased caregiver assistance and eventual return to PLOF.      Follow Up Recommendations SNF;Supervision/Assistance - 24 hour    Equipment Recommendations  3in1 (PT)    Recommendations for Other Services       Precautions / Restrictions Precautions Precautions: Fall;Anterior Hip Precaution Booklet Issued: No Restrictions Weight Bearing Restrictions: Yes RLE Weight Bearing: Weight bearing as tolerated      Mobility  Bed Mobility Overal bed mobility: Needs Assistance Bed Mobility: Supine to Sit;Sit to Supine     Supine to sit: Mod assist;Max assist Sit to supine: Mod assist;Max assist   General bed mobility comments: Mod to max A for BLE and trunk control    Transfers Overall transfer level: Needs assistance Equipment used: Rolling walker (2 wheeled) Transfers: Sit to/from Stand Sit to Stand: From elevated surface;+2 physical assistance;Max assist         General transfer  comment: +2 max A to come to standing and to maintain standing position  Ambulation/Gait             General Gait Details: Unable; pt not able to lift either LE from the floor in standing  Stairs            Wheelchair Mobility    Modified Rankin (Stroke Patients Only)       Balance Overall balance assessment: Needs assistance   Sitting balance-[name]: Poor Sitting balance - Comments: Occasional min A for sittign balance     Standing balance-[name]: Poor Standing balance comment: constant physical assist to prevent LOB in standing                             Pertinent Vitals/Pain Pain Assessment: No/denies pain Pain Location: 0/10 pain at rest, limited pain with weight bearing Pain Intervention(s): Monitored during session    Home Living Family/patient expects to be discharged to:: Private residence Living Arrangements: Children Available Help at Discharge: Family;Available 24 hours/day Type of Home: House Home Access: Stairs to enter Entrance Stairs-Rails:  (railing in the middle) Entrance Stairs-Number of Steps: 2 Home Layout: Multi-level Home Equipment: Cane - single point;Walker - 2 wheels;Shower seat      Prior Function Level of Independence: Independent with assistive device(s)         Comments: Mod Ind amb with a SPC or RW, no other falls in the last 6 months, limited community ambulator, Ind with ALDs     Hand Dominance  Dominant Hand: Right    Extremity/Trunk Assessment   Upper Extremity Assessment Upper Extremity Assessment: Generalized weakness    Lower Extremity Assessment Lower Extremity Assessment: Generalized weakness;RLE deficits/detail RLE Deficits / Details: R hip flex <3/5, R knee flex/ext >/= 3/5 LLE Deficits / Details: LLE strength grossly 3+ to 4-/5       Communication   Communication: HOH  Cognition Arousal/Alertness: Awake/alert Behavior During Therapy: WFL for tasks  assessed/performed Overall Cognitive Status: Within Functional Limits for tasks assessed                                        General Comments      Exercises Total Joint Exercises Ankle Circles/Pumps: AROM;Strengthening;Both;5 reps;10 reps Quad Sets: Strengthening;Both;10 reps Gluteal Sets: Strengthening;Both;10 reps Long Arc Quad: AROM;Strengthening;Both;10 reps Knee Flexion: AROM;Strengthening;Both;10 reps Other Exercises Other Exercises: HEP education with pt and daughter for BLE APs, QS, and GS x 10 each every 1-2 hours Other Exercises: Static sitting balance training and anterior weight shifting activities in sitting to facilitate improved transfer performance Other Exercises: Satic standing x 2 with cues for increased weight acceptance to the RLE Other Exercises: Anterior hip precaution education with pt and daughter   Assessment/Plan    PT Assessment Patient needs continued PT services  PT Problem List Decreased strength;Decreased activity tolerance;Decreased balance;Decreased mobility;Decreased knowledge of use of DME;Pain       PT Treatment Interventions DME instruction;Gait training;Stair training;Functional mobility training;Therapeutic activities;Therapeutic exercise;Balance training;Patient/family education    PT Goals (Current goals can be found in the Care Plan section)  Acute Rehab PT Goals Patient Stated Goal: To walk better and get out of the house PT Goal Formulation: With patient Time For Goal Achievement: 04/23/20 Potential to Achieve Goals: Fair    Frequency BID   Barriers to discharge Inaccessible home environment      Co-evaluation               AM-PAC PT "6 Clicks" Mobility  Outcome Measure Help needed turning from your back to your side while in a flat bed without using bedrails?: A Lot Help needed moving from lying on your back to sitting on the side of a flat bed without using bedrails?: A Lot Help needed moving to  and from a bed to a chair (including a wheelchair)?: A Lot Help needed standing up from a chair using your arms (e.g., wheelchair or bedside chair)?: A Lot Help needed to walk in hospital room?: Total Help needed climbing 3-5 steps with a railing? : Total 6 Click Score: 10    End of Session Equipment Utilized During Treatment: Gait belt Activity Tolerance: Patient tolerated treatment well Patient left: in bed;with call bell/phone within reach;with bed alarm set;with SCD's reapplied;with family/visitor present Nurse Communication: Mobility status;Weight bearing status PT Visit Diagnosis: Unsteadiness on feet (R26.81);History of falling (Z91.81);Muscle weakness (generalized) (M62.81);Difficulty in walking, not elsewhere classified (R26.2);Pain Pain - Right/Left: Right Pain - part of body: Hip    Time: 1032-1110 PT Time Calculation (min) (ACUTE ONLY): 38 min   Charges:   PT Evaluation $PT Eval Moderate Complexity: 1 Mod PT Treatments $Therapeutic Exercise: 8-22 mins $Therapeutic Activity: 8-22 mins        D. Scott Joline Encalada PT, DPT 04/10/20, 2:00 PM

## 2020-04-10 NOTE — TOC Initial Note (Addendum)
Transition of Care [facility]) - Initial/Assessment Note    Patient Details  Name: Diane Rogers MRN: [id number] Date of Birth: Feb 22, 1928  Transition of [facility] CM/SW Contact:    Magnus Ivan, LCSW Phone Number: 04/10/2020, 3:14 PM  Clinical Narrative:                CSW met with patient and patient's 2 daughters at bedside. Patient lives with daughter [name] who provides 24/7 supervision. Family provides transportation. PCP is [facility], patient has not been in a while but they plan to reestablish care there. Pharmacy is Paediatric nurse on [address]. Patient has a RW, cane, and 3 in 1 at home. Patient went to [facility] for [facility]-term rehab in the past. Patient has not had the COVID vaccines. Family is refusing SNF, wants Home Health. Patient had [facility] in the past and wants to use them again. Referral made to [facility] with [facility] for PT, OT, RN, Aide who said they can see patient Monday 3/7. Will continue to follow for discharge planning.   Expected Discharge Plan: [place] Barriers to Discharge: Continued Medical Work up   Patient Goals and CMS Choice Patient states their goals for this hospitalization and ongoing recovery are:: home with home health CMS Medicare.gov Compare Post Acute Care list provided to:: Patient Represenative (must comment) Choice offered to / list presented to : Adult Children  Expected Discharge Plan and Services Expected Discharge Plan: [place]       Living arrangements for the past 2 months: [place]: PT,OT,RN,Nurse's Aide [place] Agency: [facility] [employer] [facility] [employer] Date [place]: 04/10/20   Representative spoke with at [place]: Drue Novel  Prior Living Arrangements/Services Living arrangements for the past 2 months: [place] with:: Adult Children Patient language and need for interpreter reviewed::  Yes Do you feel safe going back to the place where you live?: Yes      Need for Family Participation in Patient Care: Yes (Comment) Care giver support system in place?: Yes (comment) Current home services: DME Criminal Activity/Legal Involvement Pertinent to Current Situation/Hospitalization: No - Comment as needed  Activities of Daily Living Home Assistive Devices/Equipment: Walker (specify type),Cane (specify quad or straight) ADL Screening (condition at time of admission) Patient's cognitive ability adequate to safely complete daily activities?: Yes Is the patient deaf or have difficulty hearing?: Yes Does the patient have difficulty seeing, even when wearing glasses/contacts?: Yes Does the patient have difficulty concentrating, remembering, or making decisions?: Yes Patient able to express need for assistance with ADLs?: Yes Does the patient have difficulty dressing or bathing?: No Independently performs ADLs?: No Communication: Independent Dressing (OT): Independent with device (comment) Grooming: Independent with device (comment) Is this a change from baseline?: Pre-admission baseline Feeding: Independent Bathing: Independent with device (comment) Toileting: Independent with device (comment) In/Out Bed: Independent with device (comment) Walks in Home: Independent with device (comment) Does the patient have difficulty walking or climbing stairs?: Yes Weakness of Legs: Both Weakness of Arms/Hands: None  Permission Sought/Granted Permission sought to share information with : Facility Art therapist granted to share information with : Yes, Verbal Permission Granted     Permission granted to share info w AGENCY: HH, DME agencies as needed  Emotional Assessment       Orientation: : Oriented to Self,Oriented to Place,Oriented to  Time,Oriented to Situation Alcohol / Substance Use: Not Applicable Psych Involvement: No (comment)  Admission  diagnosis:  Pain [R52] Fall [W19.XXXA] Closed right hip fracture ([place]) [S72.001A] Fall, initial encounter [W19.XXXA] Closed fracture of right hip, initial encounter [facility]) [S72.001A] Patient Active Problem List   Diagnosis Date Noted  . Dyslipidemia 03/13/2018  . Anemia of chronic disease 03/13/2018  . Vitamin D deficiency 03/13/2018  . Peripheral neuropathy 03/13/2018  . Hypocalcemia 03/13/2018  . Closed right hip fracture ([place]) 03/02/2018  . Lower extremity edema 07/21/2015  . Depression, endogenous ([place]) 08/01/2014  . Essential hypertension, benign 09/12/2006   PCP:  [place]:   [facility] [address] [place] [address] [place] [address] Phone: [phone] Fax: [phone]  [place] [facility] - [address] [place], [place] [address] Phone: [phone] Fax: [phone]     Social Determinants of Health (SDOH) Interventions    Readmission Risk Interventions No flowsheet data found.

## 2020-04-11 DIAGNOSIS — I10 Essential (primary) hypertension: Secondary | ICD-10-CM | POA: Diagnosis not present

## 2020-04-11 DIAGNOSIS — S72001D Fracture of unspecified part of neck of right femur, subsequent encounter for closed fracture with routine healing: Secondary | ICD-10-CM | POA: Diagnosis not present

## 2020-04-11 LAB — CBC WITH DIFFERENTIAL/PLATELET
Abs Immature Granulocytes: 0.05 10*3/uL (ref 0.00–0.07)
Basophils Absolute: 0.1 10*3/uL (ref 0.0–0.1)
Basophils Relative: 0 %
Eosinophils Absolute: 0.2 10*3/uL (ref 0.0–0.5)
Eosinophils Relative: 1 %
HCT: 36.8 % (ref 36.0–46.0)
Hemoglobin: 11.8 g/dL — ABNORMAL LOW (ref 12.0–15.0)
Immature Granulocytes: 0 %
Lymphocytes Relative: 10 %
Lymphs Abs: 1.2 10*3/uL (ref 0.7–4.0)
MCH: 30 pg (ref 26.0–34.0)
MCHC: 32.1 g/dL (ref 30.0–36.0)
MCV: 93.6 fL (ref 80.0–100.0)
Monocytes Absolute: 0.9 10*3/uL (ref 0.1–1.0)
Monocytes Relative: 7 %
Neutro Abs: 10.1 10*3/uL — ABNORMAL HIGH (ref 1.7–7.7)
Neutrophils Relative %: 82 %
Platelets: 195 10*3/uL (ref 150–400)
RBC: 3.93 MIL/uL (ref 3.87–5.11)
RDW: 14 % (ref 11.5–15.5)
WBC: 12.5 10*3/uL — ABNORMAL HIGH (ref 4.0–10.5)
nRBC: 0 % (ref 0.0–0.2)

## 2020-04-11 LAB — SURGICAL PATHOLOGY

## 2020-04-11 MED ORDER — BENAZEPRIL HCL 20 MG PO TABS
20.0000 mg | ORAL_TABLET | Freq: Every day | ORAL | Status: DC
  Administered 2020-04-11 – 2020-04-14 (×4): 20 mg via ORAL
  Filled 2020-04-11 (×4): qty 1

## 2020-04-11 MED ORDER — BUSPIRONE HCL 10 MG PO TABS
5.0000 mg | ORAL_TABLET | Freq: Three times a day (TID) | ORAL | Status: DC
  Administered 2020-04-11 – 2020-04-14 (×8): 5 mg via ORAL
  Filled 2020-04-11 (×8): qty 1

## 2020-04-11 NOTE — Progress Notes (Signed)
Physical Therapy Treatment Patient Details Name: Diane Rogers MRN: [id number] DOB: 01/07/1929 Today's Date: 04/11/2020    History of Present Illness Pt is an 87 y/o F with PMH that includes HTN, HLD, depression and fall with L hemiarthroplasty in Jan 2020. Pt now presents after sustaining mechanical fall with displaced right femoral neck fracture and is s/p right hip anterior hip hemiarthroplasty.    PT Comments    Pt was pleasant and motivated to participate during the session and put forth good effort throughout.  Pt required significant physical assistance with bed mobility tasks and to come to standing from an elevated surface along with cues for proper sequencing during transfer training.  Pt was able to take several effortful steps from the bed to the chair with min A for stability before needing to return to sitting secondary to fatigue.  Pt will benefit from PT services in a SNF setting upon discharge to safely address deficits listed in patient problem list for decreased caregiver assistance and eventual return to PLOF.     Follow Up Recommendations  SNF;Supervision/Assistance - 24 hour     Equipment Recommendations  3in1 (PT)    Recommendations for Other Services       Precautions / Restrictions Precautions Precautions: Fall;Anterior Hip Precaution Booklet Issued: Yes (comment) Restrictions Weight Bearing Restrictions: Yes RLE Weight Bearing: Weight bearing as tolerated    Mobility  Bed Mobility Overal bed mobility: Needs Assistance Bed Mobility: Supine to Sit     Supine to sit: Mod assist;Max assist     General bed mobility comments: Mod to max A for BLE and trunk control    Transfers Overall transfer level: Needs assistance Equipment used: Rolling walker (2 wheeled) Transfers: Sit to/from Stand Sit to Stand: Mod assist;+2 physical assistance         General transfer comment: Mod verbal and tactile cues for foot and hand placement and for  increased trunk flexion  Ambulation/Gait Ambulation/Gait assistance: Min assist;+2 physical assistance Gait Distance (Feet): 2 Feet Assistive device: Rolling walker (2 wheeled) Gait Pattern/deviations: Step-to pattern;Decreased step length - left;Decreased stance time - right;Antalgic;Trunk flexed Gait velocity: decreased   General Gait Details: Pt able to take several very small, effortful steps forwards/backwards from EOB to chair with min A for stability   Stairs             Wheelchair Mobility    Modified Rankin (Stroke Patients Only)       Balance Overall balance assessment: Needs assistance   Sitting balance-[name]: Fair Sitting balance - Comments: pt able to maintain static sitting balance without assist this session     Standing balance-[name]: Poor Standing balance comment: Pt required occasional min A for stability in standing                            Cognition Arousal/Alertness: Awake/alert Behavior During Therapy: WFL for tasks assessed/performed Overall Cognitive Status: Within Functional Limits for tasks assessed                                 General Comments: Cognition improved this session, able to provide pain score number and followed commands with less cuing      Exercises Total Joint Exercises Ankle Circles/Pumps: AROM;Strengthening;Both;10 reps (with manual resistance) Quad Sets: Strengthening;Both;10 reps Gluteal Sets: Strengthening;Both;10 reps Towel Squeeze: Strengthening;Both;10 reps Heel Slides: AAROM;Left;Strengthening;5 reps Long Arc Quad:  AROM;Strengthening;Both;10 reps;5 reps Knee Flexion: AROM;Strengthening;Both;10 reps;5 reps    General Comments        Pertinent Vitals/Pain Pain Assessment: 0-10 Pain Score: 5  Pain Location: R hip Pain Descriptors / Indicators: Sore Pain Intervention(s): Premedicated before session;Monitored during session    Home Living                       Prior Function            PT Goals (current goals can now be found in the care plan section) Progress towards PT goals: Progressing toward goals    Frequency    BID      PT Plan Current plan remains appropriate    Co-evaluation              AM-PAC PT "6 Clicks" Mobility   Outcome Measure  Help needed turning from your back to your side while in a flat bed without using bedrails?: A Lot Help needed moving from lying on your back to sitting on the side of a flat bed without using bedrails?: A Lot Help needed moving to and from a bed to a chair (including a wheelchair)?: A Lot Help needed standing up from a chair using your arms (e.g., wheelchair or bedside chair)?: A Lot Help needed to walk in hospital room?: Total Help needed climbing 3-5 steps with a railing? : Total 6 Click Score: 10    End of Session Equipment Utilized During Treatment: Gait belt Activity Tolerance: Patient tolerated treatment well Patient left: in bed;with call bell/phone within reach;with bed alarm set;with SCD's reapplied;with family/visitor present Nurse Communication: Mobility status;Weight bearing status;Other (comment) (+2 for transfers bed to/from chair) PT Visit Diagnosis: Unsteadiness on feet (R26.81);History of falling (Z91.81);Muscle weakness (generalized) (M62.81);Difficulty in walking, not elsewhere classified (R26.2);Pain Pain - Right/Left: Right Pain - part of body: Hip     Time: 6387-5643 PT Time Calculation (min) (ACUTE ONLY): 26 min  Charges:  $Gait Training: 8-22 mins $Therapeutic Exercise: 8-22 mins                     D. Scott Vanesa Renier PT, DPT 04/11/20, 4:57 PM

## 2020-04-11 NOTE — Progress Notes (Addendum)
Progress Note    Diane Rogers  ZOX:[id number] DOB: 1928-10-25  DOA: 04/07/2020 PCP: [facility]      Brief Narrative:    Medical records reviewed and are as summarized below:  Diane Rogers is a 87 y.o. female with medical history significant of hypertension, hyperlipidemia, depression, impaired glucose tolerance presented to the ED with complaints of progressively worsening right hip pain.  She said she sustained a fall on 03/29/2020.  She presented to the ED on 04/07/2020.  She was found to have acute right femoral neck fracture.   Assessment/Plan:   Principal Problem:   Closed right hip fracture (HCC) Active Problems:   Essential hypertension, benign   Lower extremity edema   Dyslipidemia   Anemia of chronic disease   Vitamin D deficiency   Peripheral neuropathy   Nutrition Problem: Increased nutrient needs Etiology: post-op healing  Signs/Symptoms: estimated needs   Body mass index is 27.79 kg/m.     Closed right hip fracture s/p mechanical fall: S/p right anterior hip hemiarthroplasty on 04/09/2020.  Continue analgesics as needed for pain.  Follow-up with orthopedic surgeon.   Hypertension/Hypertensive urgency: Continue amlodipine  CKD stage IIIa: Creatinine stable.  Abnormal urinalysis: Urine culture showed multiple species.  She has already received 3 doses of IV Rocephin. Discontinue IV Rocephin.    Diet Order            Diet regular Room service appropriate? Yes; Fluid consistency: Thin  Diet effective now                    Consultants:  Orthopedic surgeon  Procedures:  Right hip anterior hip hemiarthroplasty on 04/09/2020    Medications:   . amLODipine  5 mg Oral Daily  . aspirin EC  81 mg Oral BID  . benazepril  20 mg Oral Daily  . Chlorhexidine Gluconate Cloth  6 each Topical Daily  . docusate sodium  100 mg Oral BID  . feeding supplement  237 mL Oral BID BM  .  morphine injection  2 mg  Intravenous Once  . multivitamin with minerals  1 tablet Oral Daily  . senna  1 tablet Oral BID  . traMADol  50 mg Oral Q6H  . tranexamic acid (CYKLOKAPRON) topical - INTRAOP  2,000 mg Topical Once   Continuous Infusions: . sodium chloride 10 mL/hr at 04/10/20 0554  . lactated ringers Stopped (04/11/20 8119)     Anti-infectives (From admission, onward)   Start     Dose/Rate Route Frequency Ordered Stop   04/08/20 1000  cefTRIAXone (ROCEPHIN) 1 g in sodium chloride 0.9 % 100 mL IVPB  Status:  Discontinued        1 g 200 mL/hr over 30 Minutes Intravenous Every 24 hours 04/08/20 0853 04/10/20 1135             Family Communication/Anticipated D/C date and plan/Code Status   DVT prophylaxis: SCDs Start: 04/09/20 2002 SCDs Start: 04/07/20 1727     Code Status: Full Code  Family Communication: Discussed with her daughter, Bonita Quin, at the bedside Disposition Plan:    Status is: Inpatient  Remains inpatient appropriate because:Unsafe d/c plan   Dispo: The patient is from: Home              Anticipated d/c is to: Home              Patient currently is not medically stable to d/c.   Difficult to place  patient No           Subjective:   C/o hip pain.  Her daughter, Bonita Quin, is at the bedside.  Her nurse was also at the bedside.  Objective:    Vitals:   04/10/20 2302 04/11/20 0422 04/11/20 0801 04/11/20 0923  BP: (!) 143/65 (!) 149/70 (!) 151/122 (!) 165/70  Pulse: 95 91 93 (!) 103  Resp: 15 15 17 17   Temp: 98.9 F (37.2 C) 98.7 F (37.1 C) 98.4 F (36.9 C) 98.5 F (36.9 C)  TempSrc:      SpO2: 92% 99% 97% 96%  Weight:      Height:       No data found.   Intake/Output Summary (Last 24 hours) at 04/11/2020 1219 Last data filed at 04/11/2020 0149 Gross per 24 hour  Intake 300 ml  Output 100 ml  Net 200 ml   Filed Weights   04/07/20 1402 04/07/20 2052 04/09/20 1408  Weight: 64 kg 66.7 kg 66.7 kg    Exam:  GEN: NAD SKIN: Warm and dry EYES: No  pallor or icterus ENT: MMM CV: RRR PULM: CTA B ABD: soft, ND, NT, +BS CNS: AAO x 3, non focal EXT: Dressing on right hip surgical wound is clean, dry and intact.       Data Reviewed:   I have personally reviewed following labs and imaging studies:  Labs: Labs show the following:   Basic Metabolic Panel: Recent Labs  Lab 04/07/20 1629 04/08/20 0514  NA 136 140  K 4.1 4.1  CL 102 105  CO2 25 28  GLUCOSE 105* 103*  BUN 21 23  CREATININE 1.05* 0.96  CALCIUM 8.9 8.6*   GFR Estimated Creatinine Clearance: 34.7 mL/min (by C-G formula based on SCr of 0.96 mg/dL). Liver Function Tests: No results for input(s): AST, ALT, ALKPHOS, BILITOT, PROT, ALBUMIN in the last 168 hours. No results for input(s): LIPASE, AMYLASE in the last 168 hours. No results for input(s): AMMONIA in the last 168 hours. Coagulation profile Recent Labs  Lab 04/07/20 1629  INR 1.1    CBC: Recent Labs  Lab 04/07/20 1629 04/08/20 0514 04/09/20 0429 04/10/20 0618 04/11/20 0430  WBC 15.0* 12.2* 12.3* 13.2* 12.5*  NEUTROABS 12.4*  --   --  11.0* 10.1*  HGB 14.8 13.7 13.8 12.3 11.8*  HCT 43.4 41.7 43.1 36.9 36.8  MCV 92.3 93.1 93.3 92.7 93.6  PLT 254 231 250 199 195   Cardiac Enzymes: No results for input(s): CKTOTAL, CKMB, CKMBINDEX, TROPONINI in the last 168 hours. BNP (last 3 results) No results for input(s): PROBNP in the last 8760 hours. CBG: No results for input(s): GLUCAP in the last 168 hours. D-Dimer: No results for input(s): DDIMER in the last 72 hours. Hgb A1c: No results for input(s): HGBA1C in the last 72 hours. Lipid Profile: No results for input(s): CHOL, HDL, LDLCALC, TRIG, CHOLHDL, LDLDIRECT in the last 72 hours. Thyroid function studies: No results for input(s): TSH, T4TOTAL, T3FREE, THYROIDAB in the last 72 hours.  Invalid input(s): FREET3 Anemia work up: No results for input(s): VITAMINB12, FOLATE, FERRITIN, TIBC, IRON, RETICCTPCT in the last 72 hours. Sepsis  Labs: Recent Labs  Lab 04/08/20 0514 04/09/20 0429 04/10/20 0618 04/11/20 0430  WBC 12.2* 12.3* 13.2* 12.5*    Microbiology Recent Results (from the past 240 hour(s))  Resp Panel by RT-PCR (Flu A&B, Covid) Nasopharyngeal Swab     Status: None   Collection Time: 04/07/20  5:16 PM   Specimen: Nasopharyngeal  Swab; Nasopharyngeal(NP) swabs in vial transport medium  Result Value Ref Range Status   SARS Coronavirus 2 by RT PCR NEGATIVE NEGATIVE Final    Comment: (NOTE) SARS-CoV-2 target nucleic acids are NOT DETECTED.  The SARS-CoV-2 RNA is generally detectable in upper respiratory specimens during the acute phase of infection. The lowest concentration of SARS-CoV-2 viral copies this assay can detect is 138 copies/mL. A negative result does not preclude SARS-Cov-2 infection and should not be used as the sole basis for treatment or other patient management decisions. A negative result may occur with  improper specimen collection/handling, submission of specimen other than nasopharyngeal swab, presence of viral mutation(s) within the areas targeted by this assay, and inadequate number of viral copies(<138 copies/mL). A negative result must be combined with clinical observations, patient history, and epidemiological information. The expected result is Negative.  Fact Sheet for Patients:  BloggerCourse.comhttps://www.fda.gov/media/152166/download  Fact Sheet for Healthcare Providers:  SeriousBroker.ithttps://www.fda.gov/media/152162/download  This test is no t yet approved or cleared by the [place] FDA and  has been authorized for detection and/or diagnosis of SARS-CoV-2 by FDA under an Emergency Use Authorization (EUA). This EUA will remain  in effect (meaning this test can be used) for the duration of the COVID-19 declaration under Section 564(b)(1) of the Act, 21 U.S.C.section 360bbb-3(b)(1), unless the authorization is terminated  or revoked sooner.       Influenza A by PCR NEGATIVE NEGATIVE Final    Influenza B by PCR NEGATIVE NEGATIVE Final    Comment: (NOTE) The Xpert Xpress SARS-CoV-2/FLU/RSV plus assay is intended as an aid in the diagnosis of influenza from Nasopharyngeal swab specimens and should not be used as a sole basis for treatment. Nasal washings and aspirates are unacceptable for Xpert Xpress SARS-CoV-2/FLU/RSV testing.  Fact Sheet for Patients: BloggerCourse.comhttps://www.fda.gov/media/152166/download  Fact Sheet for Healthcare Providers: SeriousBroker.ithttps://www.fda.gov/media/152162/download  This test is not yet approved or cleared by the [place] FDA and has been authorized for detection and/or diagnosis of SARS-CoV-2 by FDA under an Emergency Use Authorization (EUA). This EUA will remain in effect (meaning this test can be used) for the duration of the COVID-19 declaration under Section 564(b)(1) of the Act, 21 U.S.C. section 360bbb-3(b)(1), unless the authorization is terminated or revoked.  Performed at [facility], [address] [place], [place] [address]   Surgical pcr screen     Status: None   Collection Time: 04/08/20  1:33 AM   Specimen: Nasal Mucosa; Nasal Swab  Result Value Ref Range Status   MRSA, PCR NEGATIVE NEGATIVE Final   Staphylococcus aureus NEGATIVE NEGATIVE Final    Comment: (NOTE) The Xpert SA Assay (FDA approved for NASAL specimens in patients 85 years of age and older), is one component of a comprehensive surveillance program. It is not intended to diagnose infection nor to guide or monitor treatment. Performed at [facility], [address] [place], [place] [address]   Urine Culture     Status: Abnormal   Collection Time: 04/08/20  3:30 AM   Specimen: Urine, Random  Result Value Ref Range Status   Specimen Description   Final    URINE, RANDOM Performed at [facility], [address] [place], [place] [address]    Special Requests   Final    NONE Performed at [facility], [address]  [place], [place] [address]    Culture MULTIPLE SPECIES PRESENT, SUGGEST RECOLLECTION (A)  Final   Report Status 04/09/2020 FINAL  Final    Procedures and  diagnostic studies:  DG HIP OPERATIVE UNILAT W OR W/O PELVIS RIGHT  Result Date: 04/09/2020 CLINICAL DATA:  RIGHT hip replacement EXAM: OPERATIVE RIGHT HIP (WITH PELVIS IF PERFORMED)  VIEWS TECHNIQUE: Fluoroscopic spot image(s) were submitted for interpretation post-operatively. COMPARISON:  None. FINDINGS: Intraoperative fluoroscopic images demonstrating RIGHT hip arthroplasty. Hardware appears intact and appropriately position. Fluoroscopy was provided for 7 seconds. IMPRESSION: Intraoperative fluoroscopic images of the RIGHT hip arthroplasty. No evidence of surgical complicating feature. Electronically Signed   By: Bary Richard M.D.   On: 04/09/2020 18:32               LOS: 4 days   Thy Gullikson  [facility]   Pager on [email or web address]. If 7PM-7AM, please contact night-coverage at [email or web address]     04/11/2020, 12:19 PM               Progress Note    LLESENIA FOGAL  ZOX:[id number] DOB: 1928/05/14  DOA: 04/07/2020 PCP: [facility]      Brief Narrative:    Medical records reviewed and are as summarized below:  MAHLANI BERNINGER is a 87 y.o. female with medical history significant of hypertension, hyperlipidemia, depression, impaired glucose tolerance presented to the ED with complaints of progressively worsening right hip pain.  She said she sustained a fall on 03/29/2020.  She presented to the ED on 04/07/2020.  She was found to have acute right femoral neck fracture.   Assessment/Plan:   Principal Problem:   Closed right hip fracture (HCC) Active Problems:   Essential hypertension, benign   Lower extremity edema   Dyslipidemia   Anemia of chronic disease   Vitamin D deficiency   Peripheral neuropathy   Nutrition Problem: Increased nutrient needs Etiology: post-op  healing  Signs/Symptoms: estimated needs   Body mass index is 27.79 kg/m.     Closed right hip fracture s/p mechanical fall: S/p right hip anterior hip hemiarthroplasty on 04/09/2020.  Continue analgesics as needed for pain.  PT and OT recommended discharge to SNF.  However, patient prefers to go home with home health therapy.  According to the social worker, home health therapy services will not be available until Monday, 04/14/2020.  Hypertension/Hypertensive urgency: Continue amlodipine.  Benazepril has been added for adequate BP control.  Monitor BP closely.  CKD stage IIIa: Creatinine stable.  Abnormal urinalysis: Urine culture showed multiple species.  UTI is unlikely.  Completed 3 doses of IV Rocephin.  Plan to discharge home with home health therapy tomorrow.   Diet Order            Diet regular Room service appropriate? Yes; Fluid consistency: Thin  Diet effective now                    Consultants:  Orthopedic surgeon  Procedures:  Right hip anterior hip hemiarthroplasty on 04/09/2020    Medications:   . amLODipine  5 mg Oral Daily  . aspirin EC  81 mg Oral BID  . benazepril  20 mg Oral Daily  . Chlorhexidine Gluconate Cloth  6 each Topical Daily  . docusate sodium  100 mg Oral BID  . feeding supplement  237 mL Oral BID BM  .  morphine injection  2 mg Intravenous Once  . multivitamin with minerals  1 tablet Oral Daily  . senna  1 tablet Oral BID  . traMADol  50 mg Oral Q6H  . tranexamic acid (CYKLOKAPRON) topical - INTRAOP  2,000 mg Topical Once  Continuous Infusions: . sodium chloride 10 mL/hr at 04/10/20 0554  . lactated ringers Stopped (04/11/20 1610)     Anti-infectives (From admission, onward)   Start     Dose/Rate Route Frequency Ordered Stop   04/08/20 1000  cefTRIAXone (ROCEPHIN) 1 g in sodium chloride 0.9 % 100 mL IVPB  Status:  Discontinued        1 g 200 mL/hr over 30 Minutes Intravenous Every 24 hours 04/08/20 0853 04/10/20 1135              Family Communication/Anticipated D/C date and plan/Code Status   DVT prophylaxis: SCDs Start: 04/09/20 2002 SCDs Start: 04/07/20 1727     Code Status: Full Code  Family Communication: Discussed with her daughter, Bonita Quin, at the bedside Disposition Plan:    Status is: Inpatient  Remains inpatient appropriate because:Unsafe d/c plan   Dispo: The patient is from: Home              Anticipated d/c is to: Home              Patient currently is not medically stable to d/c.   Difficult to place patient No           Subjective:   Interval events noted.  She complains of right hip pain.  She has not had much urine output.  Objective:    Vitals:   04/11/20 0422 04/11/20 0801 04/11/20 0923 04/11/20 1222  BP: (!) 149/70 (!) 151/122 (!) 165/70 (!) 150/64  Pulse: 91 93 (!) 103 (!) 104  Resp: Temp: 98.7 F (37.1 C) 98.4 F (36.9 C) 98.5 F (36.9 C) 97.9 F (36.6 C)  TempSrc:    Oral  SpO2: 99% 97% 96% 97%  Weight:      Height:       No data found.   Intake/Output Summary (Last 24 hours) at 04/11/2020 1231 Last data filed at 04/11/2020 0149 Gross per 24 hour  Intake 300 ml  Output 100 ml  Net 200 ml   Filed Weights   04/07/20 1402 04/07/20 2052 04/09/20 1408  Weight: 64 kg 66.7 kg 66.7 kg    Exam:  GEN: NAD SKIN: No rash EYES: EOMI ENT: MMM CV: RRR PULM: CTA B ABD: soft, ND, NT, +BS CNS: AAO x 3, non focal EXT: Dressing on right hip surgical wound is clean, dry and intact       Data Reviewed:   I have personally reviewed following labs and imaging studies:  Labs: Labs show the following:   Basic Metabolic Panel: Recent Labs  Lab 04/07/20 1629 04/08/20 0514  NA 136 140  K 4.1 4.1  CL 102 105  CO2 25 28  GLUCOSE 105* 103*  BUN 21 23  CREATININE 1.05* 0.96  CALCIUM 8.9 8.6*   GFR Estimated Creatinine Clearance: 34.7 mL/min (by C-G formula based on SCr of 0.96 mg/dL). Liver Function Tests: No results  for input(s): AST, ALT, ALKPHOS, BILITOT, PROT, ALBUMIN in the last 168 hours. No results for input(s): LIPASE, AMYLASE in the last 168 hours. No results for input(s): AMMONIA in the last 168 hours. Coagulation profile Recent Labs  Lab 04/07/20 1629  INR 1.1    CBC: Recent Labs  Lab 04/07/20 1629 04/08/20 0514 04/09/20 0429 04/10/20 0618 04/11/20 0430  WBC 15.0* 12.2* 12.3* 13.2* 12.5*  NEUTROABS 12.4*  --   --  11.0* 10.1*  HGB 14.8 13.7 13.8 12.3 11.8*  HCT 43.4 41.7 43.1 36.9 36.8  MCV 92.3 93.1 93.3 92.7 93.6  PLT 254 231 250 199 195   Cardiac Enzymes: No results for input(s): CKTOTAL, CKMB, CKMBINDEX, TROPONINI in the last 168 hours. BNP (last 3 results) No results for input(s): PROBNP in the last 8760 hours. CBG: No results for input(s): GLUCAP in the last 168 hours. D-Dimer: No results for input(s): DDIMER in the last 72 hours. Hgb A1c: No results for input(s): HGBA1C in the last 72 hours. Lipid Profile: No results for input(s): CHOL, HDL, LDLCALC, TRIG, CHOLHDL, LDLDIRECT in the last 72 hours. Thyroid function studies: No results for input(s): TSH, T4TOTAL, T3FREE, THYROIDAB in the last 72 hours.  Invalid input(s): FREET3 Anemia work up: No results for input(s): VITAMINB12, FOLATE, FERRITIN, TIBC, IRON, RETICCTPCT in the last 72 hours. Sepsis Labs: Recent Labs  Lab 04/08/20 0514 04/09/20 0429 04/10/20 0618 04/11/20 0430  WBC 12.2* 12.3* 13.2* 12.5*    Microbiology Recent Results (from the past 240 hour(s))  Resp Panel by RT-PCR (Flu A&B, Covid) Nasopharyngeal Swab     Status: None   Collection Time: 04/07/20  5:16 PM   Specimen: Nasopharyngeal Swab; Nasopharyngeal(NP) swabs in vial transport medium  Result Value Ref Range Status   SARS Coronavirus 2 by RT PCR NEGATIVE NEGATIVE Final    Comment: (NOTE) SARS-CoV-2 target nucleic acids are NOT DETECTED.  The SARS-CoV-2 RNA is generally detectable in upper respiratory specimens during the acute  phase of infection. The lowest concentration of SARS-CoV-2 viral copies this assay can detect is 138 copies/mL. A negative result does not preclude SARS-Cov-2 infection and should not be used as the sole basis for treatment or other patient management decisions. A negative result may occur with  improper specimen collection/handling, submission of specimen other than nasopharyngeal swab, presence of viral mutation(s) within the areas targeted by this assay, and inadequate number of viral copies(<138 copies/mL). A negative result must be combined with clinical observations, patient history, and epidemiological information. The expected result is Negative.  Fact Sheet for Patients:  BloggerCourse.com  Fact Sheet for Healthcare Providers:  SeriousBroker.it  This test is no t yet approved or cleared by the [place] FDA and  has been authorized for detection and/or diagnosis of SARS-CoV-2 by FDA under an Emergency Use Authorization (EUA). This EUA will remain  in effect (meaning this test can be used) for the duration of the COVID-19 declaration under Section 564(b)(1) of the Act, 21 U.S.C.section 360bbb-3(b)(1), unless the authorization is terminated  or revoked sooner.       Influenza A by PCR NEGATIVE NEGATIVE Final   Influenza B by PCR NEGATIVE NEGATIVE Final    Comment: (NOTE) The Xpert Xpress SARS-CoV-2/FLU/RSV plus assay is intended as an aid in the diagnosis of influenza from Nasopharyngeal swab specimens and should not be used as a sole basis for treatment. Nasal washings and aspirates are unacceptable for Xpert Xpress SARS-CoV-2/FLU/RSV testing.  Fact Sheet for Patients: BloggerCourse.com  Fact Sheet for Healthcare Providers: SeriousBroker.it  This test is not yet approved or cleared by the [place] FDA and has been authorized for detection and/or diagnosis of  SARS-CoV-2 by FDA under an Emergency Use Authorization (EUA). This EUA will remain in effect (meaning this test can be used) for the duration of the COVID-19 declaration under Section 564(b)(1) of the Act, 21 U.S.C. section 360bbb-3(b)(1), unless the authorization is terminated or revoked.  Performed at [facility], [address], [place], [place] [address]   Surgical pcr screen     Status: None  Collection Time: 04/08/20  1:33 AM   Specimen: Nasal Mucosa; Nasal Swab  Result Value Ref Range Status   MRSA, PCR NEGATIVE NEGATIVE Final   Staphylococcus aureus NEGATIVE NEGATIVE Final    Comment: (NOTE) The Xpert SA Assay (FDA approved for NASAL specimens in patients 54 years of age and older), is one component of a comprehensive surveillance program. It is not intended to diagnose infection nor to guide or monitor treatment. Performed at [facility], [address], [place], [place] [address]   Urine Culture     Status: Abnormal   Collection Time: 04/08/20  3:30 AM   Specimen: Urine, Random  Result Value Ref Range Status   Specimen Description   Final    URINE, RANDOM Performed at [facility], [address] [place], [place] [address]    Special Requests   Final    NONE Performed at [facility], [address] [place], [place] [address]    Culture MULTIPLE SPECIES PRESENT, SUGGEST RECOLLECTION (A)  Final   Report Status 04/09/2020 FINAL  Final    Procedures and diagnostic studies:  DG HIP OPERATIVE UNILAT W OR W/O PELVIS RIGHT  Result Date: 04/09/2020 CLINICAL DATA:  RIGHT hip replacement EXAM: OPERATIVE RIGHT HIP (WITH PELVIS IF PERFORMED)  VIEWS TECHNIQUE: Fluoroscopic spot image(s) were submitted for interpretation post-operatively. COMPARISON:  None. FINDINGS: Intraoperative fluoroscopic images demonstrating RIGHT hip arthroplasty. Hardware appears intact and appropriately position. Fluoroscopy was provided for 7 seconds.  IMPRESSION: Intraoperative fluoroscopic images of the RIGHT hip arthroplasty. No evidence of surgical complicating feature. Electronically Signed   By: Bary Richard M.D.   On: 04/09/2020 18:32               LOS: 4 days   Kaisha Wachob  [facility]   Pager on [email or web address]. If 7PM-7AM, please contact night-coverage at [email or web address]     04/11/2020, 12:31 PM

## 2020-04-11 NOTE — Progress Notes (Signed)
Physical Therapy Treatment Patient Details Name: Diane Rogers MRN: [id number] DOB: 04-20-1928 Today's Date: 04/11/2020    History of Present Illness Pt is an 87 y/o F with PMH that includes HTN, HLD, depression and fall with L hemiarthroplasty in Jan 2020. Pt now presents after sustaining mechanical fall with displaced right femoral neck fracture and is s/p right hip anterior hip hemiarthroplasty.    PT Comments    Pt was pleasant and motivated to participate during the session but was somewhat more confused and required increase cuing and time to follow commands, nsg notified.  Pt continued to require extensive physical assistance with bed mobility tasks but presented with grossly improved static sitting balance upon coming up to sitting.  Pt was also able to stand with decreased assistance and took several small, effortful steps at the EOB for the first time.  Pt's SpO2 was in the low to mid 90s with HR in the low 110's with activity.  Pt will benefit from PT services in a SNF setting upon discharge to safely address deficits listed in patient problem list for decreased caregiver assistance and eventual return to PLOF.     Follow Up Recommendations  SNF;Supervision/Assistance - 24 hour     Equipment Recommendations  3in1 (PT)    Recommendations for Other Services       Precautions / Restrictions Precautions Precautions: Fall;Anterior Hip Precaution Booklet Issued: Yes (comment) Restrictions Weight Bearing Restrictions: Yes RLE Weight Bearing: Weight bearing as tolerated    Mobility  Bed Mobility Overal bed mobility: Needs Assistance Bed Mobility: Supine to Sit;Sit to Supine     Supine to sit: Mod assist;Max assist Sit to supine: Mod assist;Max assist   General bed mobility comments: Mod to max A for BLE and trunk control    Transfers Overall transfer level: Needs assistance Equipment used: Rolling walker (2 wheeled) Transfers: Sit to/from Stand Sit to Stand:  Mod assist;+2 physical assistance         General transfer comment: Mod verbal and tactile cues for foot and hand placement and for increased trunk flexion  Ambulation/Gait Ambulation/Gait assistance: Min assist;+2 physical assistance Gait Distance (Feet): 1 Feet Assistive device: Rolling walker (2 wheeled) Gait Pattern/deviations: Step-to pattern;Decreased step length - left;Decreased stance time - right;Antalgic;Trunk flexed Gait velocity: decreased   General Gait Details: Pt able to take several very small, effortful steps forwards/backwards at the EOB with min A for stability   Stairs             Wheelchair Mobility    Modified Rankin (Stroke Patients Only)       Balance Overall balance assessment: Needs assistance   Sitting balance-[name]: Fair Sitting balance - Comments: pt able to maintain static sitting balance without assist this session     Standing balance-[name]: Poor Standing balance comment: Standing balance improved grossly this session but pt continued to require occasional min A for stability in standing                            Cognition Arousal/Alertness: Awake/alert Behavior During Therapy: WFL for tasks assessed/performed Overall Cognitive Status: Impaired/Different from baseline                                 General Comments: Pt somewhat confused this session, unable to provide pain number, required extra time/cuing for some commands but overall was appropriate and  able to participate fully during the session      Exercises Total Joint Exercises Ankle Circles/Pumps: AROM;Strengthening;Both;10 reps Quad Sets: Strengthening;Both;10 reps Hip ABduction/ADduction: AAROM;Strengthening;Both;10 reps (AAROM on the RLE) Straight Leg Raises: AAROM;Strengthening;Both;10 reps (AAROM on the RLE) Long Arc Quad: AROM;Strengthening;Both;10 reps Knee Flexion: AROM;Strengthening;Both;10 reps Other Exercises Other  Exercises: Satic standing x 2 with cues for increased weight acceptance to the RLE Other Exercises: Standing RLE marches 2 x 5    General Comments        Pertinent Vitals/Pain Pain Assessment: Faces Faces Pain Scale: Hurts a little bit Pain Location: R hip (pt unable to provide pain number this session, stated "it hurts a little" Pain Descriptors / Indicators: Sore Pain Intervention(s): Premedicated before session;Monitored during session    Home Living                      Prior Function            PT Goals (current goals can now be found in the care plan section) Progress towards PT goals: Progressing toward goals    Frequency    BID      PT Plan Current plan remains appropriate    Co-evaluation              AM-PAC PT "6 Clicks" Mobility   Outcome Measure  Help needed turning from your back to your side while in a flat bed without using bedrails?: A Lot Help needed moving from lying on your back to sitting on the side of a flat bed without using bedrails?: A Lot Help needed moving to and from a bed to a chair (including a wheelchair)?: A Lot Help needed standing up from a chair using your arms (e.g., wheelchair or bedside chair)?: A Lot Help needed to walk in hospital room?: Total Help needed climbing 3-5 steps with a railing? : Total 6 Click Score: 10    End of Session Equipment Utilized During Treatment: Gait belt Activity Tolerance: Patient tolerated treatment well Patient left: in bed;with call bell/phone within reach;with bed alarm set;with SCD's reapplied;with family/visitor present Nurse Communication: Mobility status;Weight bearing status;Other (comment) (Pt more confused this session) PT Visit Diagnosis: Unsteadiness on feet (R26.81);History of falling (Z91.81);Muscle weakness (generalized) (M62.81);Difficulty in walking, not elsewhere classified (R26.2);Pain Pain - Right/Left: Right Pain - part of body: Hip     Time: 1002-1020 (and  11:02-11:22 with break for hygiene) PT Time Calculation (min) (ACUTE ONLY): 18 min  Charges:  $Gait Training: 8-22 mins $Therapeutic Exercise: 8-22 mins $Therapeutic Activity: 8-22 mins                    D. Scott Tanner PT, DPT 04/11/20, 12:06 PM

## 2020-04-11 NOTE — Progress Notes (Signed)
  Subjective:  Patient reports pain as mild.  Very alert today.  Objective:   VITALS:   Vitals:   04/10/20 1949 04/10/20 2302 04/11/20 0422 04/11/20 0801  BP: (!) 184/68 (!) 143/65 (!) 149/70 (!) 151/122  Pulse: 93 95 91 93  Resp: 16 15 15 17   Temp: 99.1 F (37.3 C) 98.9 F (37.2 C) 98.7 F (37.1 C) 98.4 F (36.9 C)  TempSrc:      SpO2: 96% 92% 99% 97%  Weight:      Height:        PHYSICAL EXAM:  Neurologically intact ABD soft Neurovascular intact Sensation intact distally Intact pulses distally Dorsiflexion/Plantar flexion intact Incision: dressing C/D/I No cellulitis present Compartment soft  LABS  Results for orders placed or performed during the hospital encounter of 04/07/20 (from the past 24 hour(s))  CBC with Differential/Platelet     Status: Abnormal   Collection Time: 04/11/20  4:30 AM  Result Value Ref Range   WBC 12.5 (H) 4.0 - 10.5 K/uL   RBC 3.93 3.87 - 5.11 MIL/uL   Hemoglobin 11.8 (L) 12.0 - 15.0 g/dL   HCT 06/11/20 03.4 - 91.7 %   MCV 93.6 80.0 - 100.0 fL   MCH 30.0 26.0 - 34.0 pg   MCHC 32.1 30.0 - 36.0 g/dL   RDW 91.5 05.6 - 97.9 %   Platelets 195 150 - 400 K/uL   nRBC 0.0 0.0 - 0.2 %   Neutrophils Relative % 82 %   Neutro Abs 10.1 (H) 1.7 - 7.7 K/uL   Lymphocytes Relative 10 %   Lymphs Abs 1.2 0.7 - 4.0 K/uL   Monocytes Relative 7 %   Monocytes Absolute 0.9 0.1 - 1.0 K/uL   Eosinophils Relative 1 %   Eosinophils Absolute 0.2 0.0 - 0.5 K/uL   Basophils Relative 0 %   Basophils Absolute 0.1 0.0 - 0.1 K/uL   Immature Granulocytes 0 %   Abs Immature Granulocytes 0.05 0.00 - 0.07 K/uL    DG HIP OPERATIVE UNILAT W OR W/O PELVIS RIGHT  Result Date: 04/09/2020 CLINICAL DATA:  RIGHT hip replacement EXAM: OPERATIVE RIGHT HIP (WITH PELVIS IF PERFORMED)  VIEWS TECHNIQUE: Fluoroscopic spot image(s) were submitted for interpretation post-operatively. COMPARISON:  None. FINDINGS: Intraoperative fluoroscopic images demonstrating RIGHT hip arthroplasty.  Hardware appears intact and appropriately position. Fluoroscopy was provided for 7 seconds. IMPRESSION: Intraoperative fluoroscopic images of the RIGHT hip arthroplasty. No evidence of surgical complicating feature. Electronically Signed   By: 06/09/2020 M.D.   On: 04/09/2020 18:32    Assessment/Plan: 2 Days Post-Op   Principal Problem:   Closed right hip fracture (HCC) Active Problems:   Essential hypertension, benign   Lower extremity edema   Dyslipidemia   Anemia of chronic disease   Vitamin D deficiency   Peripheral neuropathy   Advance diet Up with therapy Aspirin 81 mg BID for 30 days Hemoglobin stable WBAT in RLE Discharge per hospitalist Follow up in 2 weeks for staple removal  06/09/2020 , PA-C 04/11/2020, 8:15 AM

## 2020-04-11 NOTE — Progress Notes (Signed)
MD notified that pt has put out minimal urine since surgery. RN stated fluids were provided overnight and pts urine remains dark amber in color. Bladder scans have not revealed a volume >371mL since admission. Fluids stopped d/t pts elevated blood pressure. Blood pressure medications provided and MD made aware of this as well. RN will continue to follow orders as necessary and monitor pt for any changes.

## 2020-04-12 DIAGNOSIS — S72001D Fracture of unspecified part of neck of right femur, subsequent encounter for closed fracture with routine healing: Secondary | ICD-10-CM | POA: Diagnosis not present

## 2020-04-12 DIAGNOSIS — I10 Essential (primary) hypertension: Secondary | ICD-10-CM | POA: Diagnosis not present

## 2020-04-12 LAB — VITAMIN B12: Vitamin B-12: 140 pg/mL — ABNORMAL LOW (ref 180–914)

## 2020-04-12 IMAGING — CR DG HIP (WITH OR WITHOUT PELVIS) 2-3V*L*
1 series · 3 of 3 positions shown · non-contrast
Comparison: None.

CLINICAL DATA: 89-year-old female with hip pain after a fall

EXAM:
DG HIP (WITH OR WITHOUT PELVIS) 2-3V LEFT

[Series 1: dg hip unilat w or w/o pelvis 2-3 views  · non-contrast · 0.14mm/px · 3 of 3 slices shown]
[im 1/3]
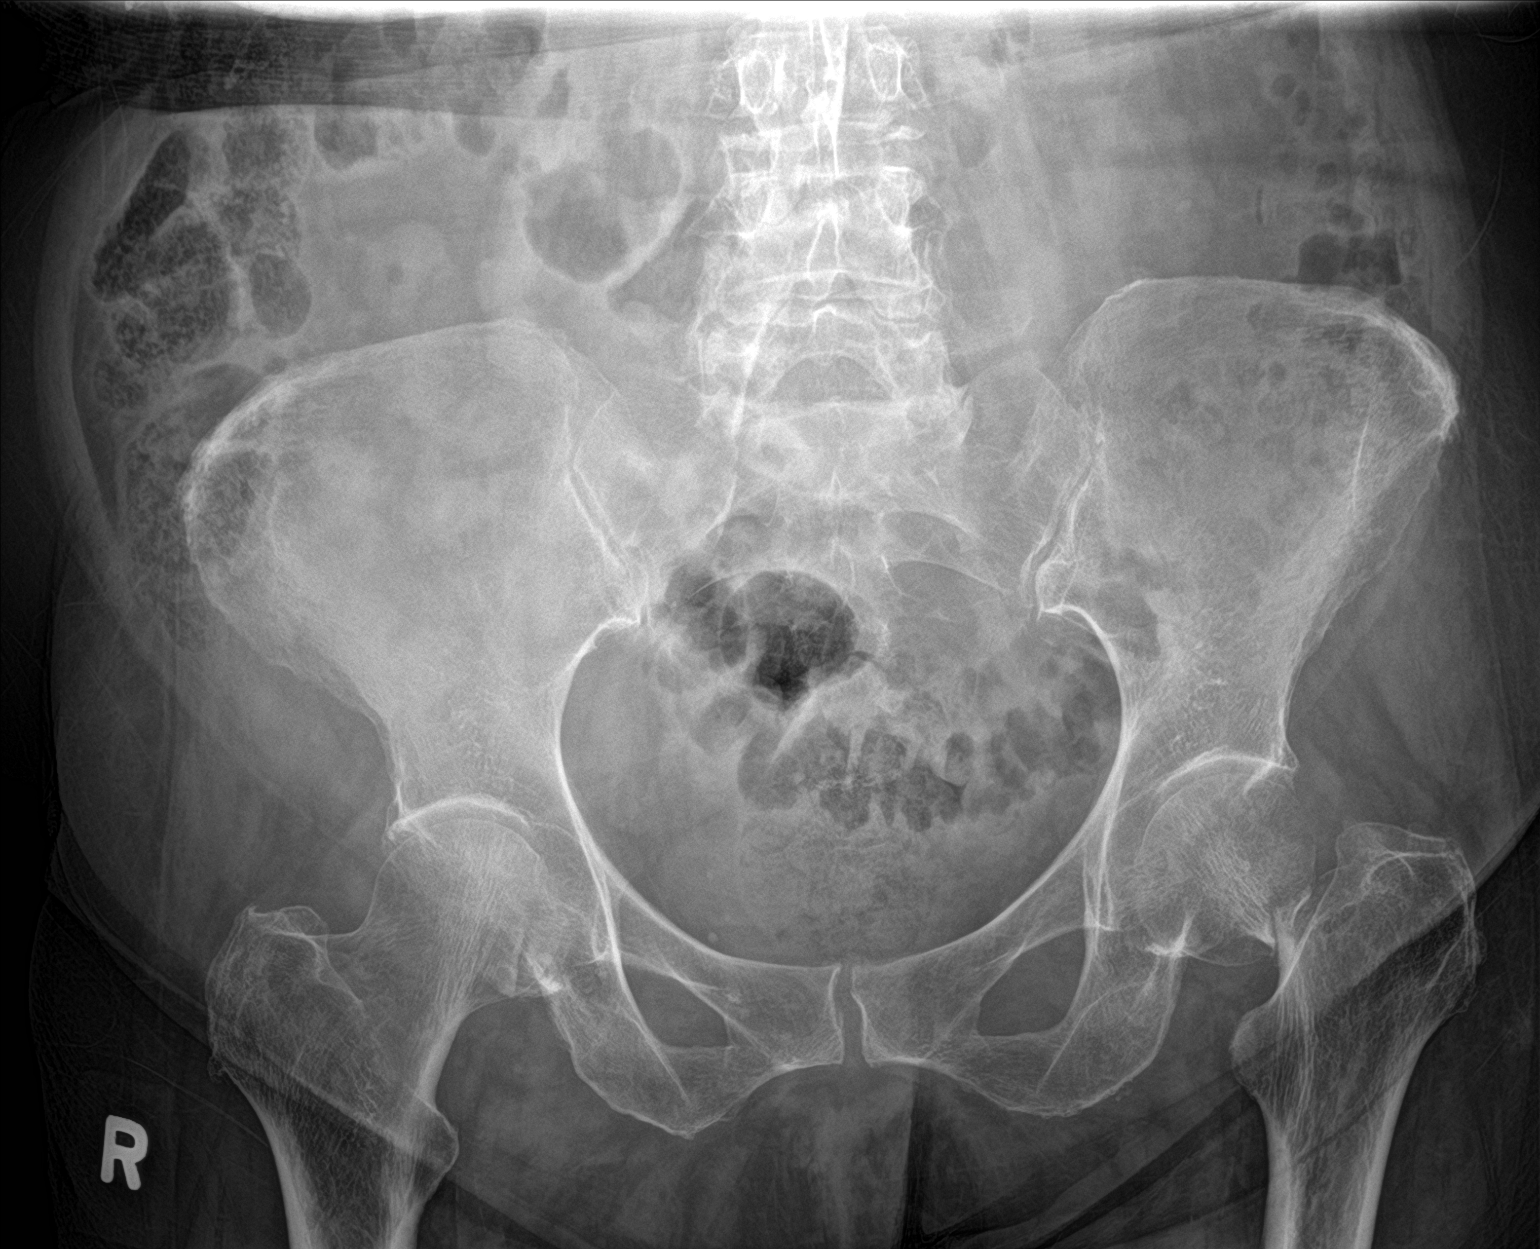
[im 2/3]
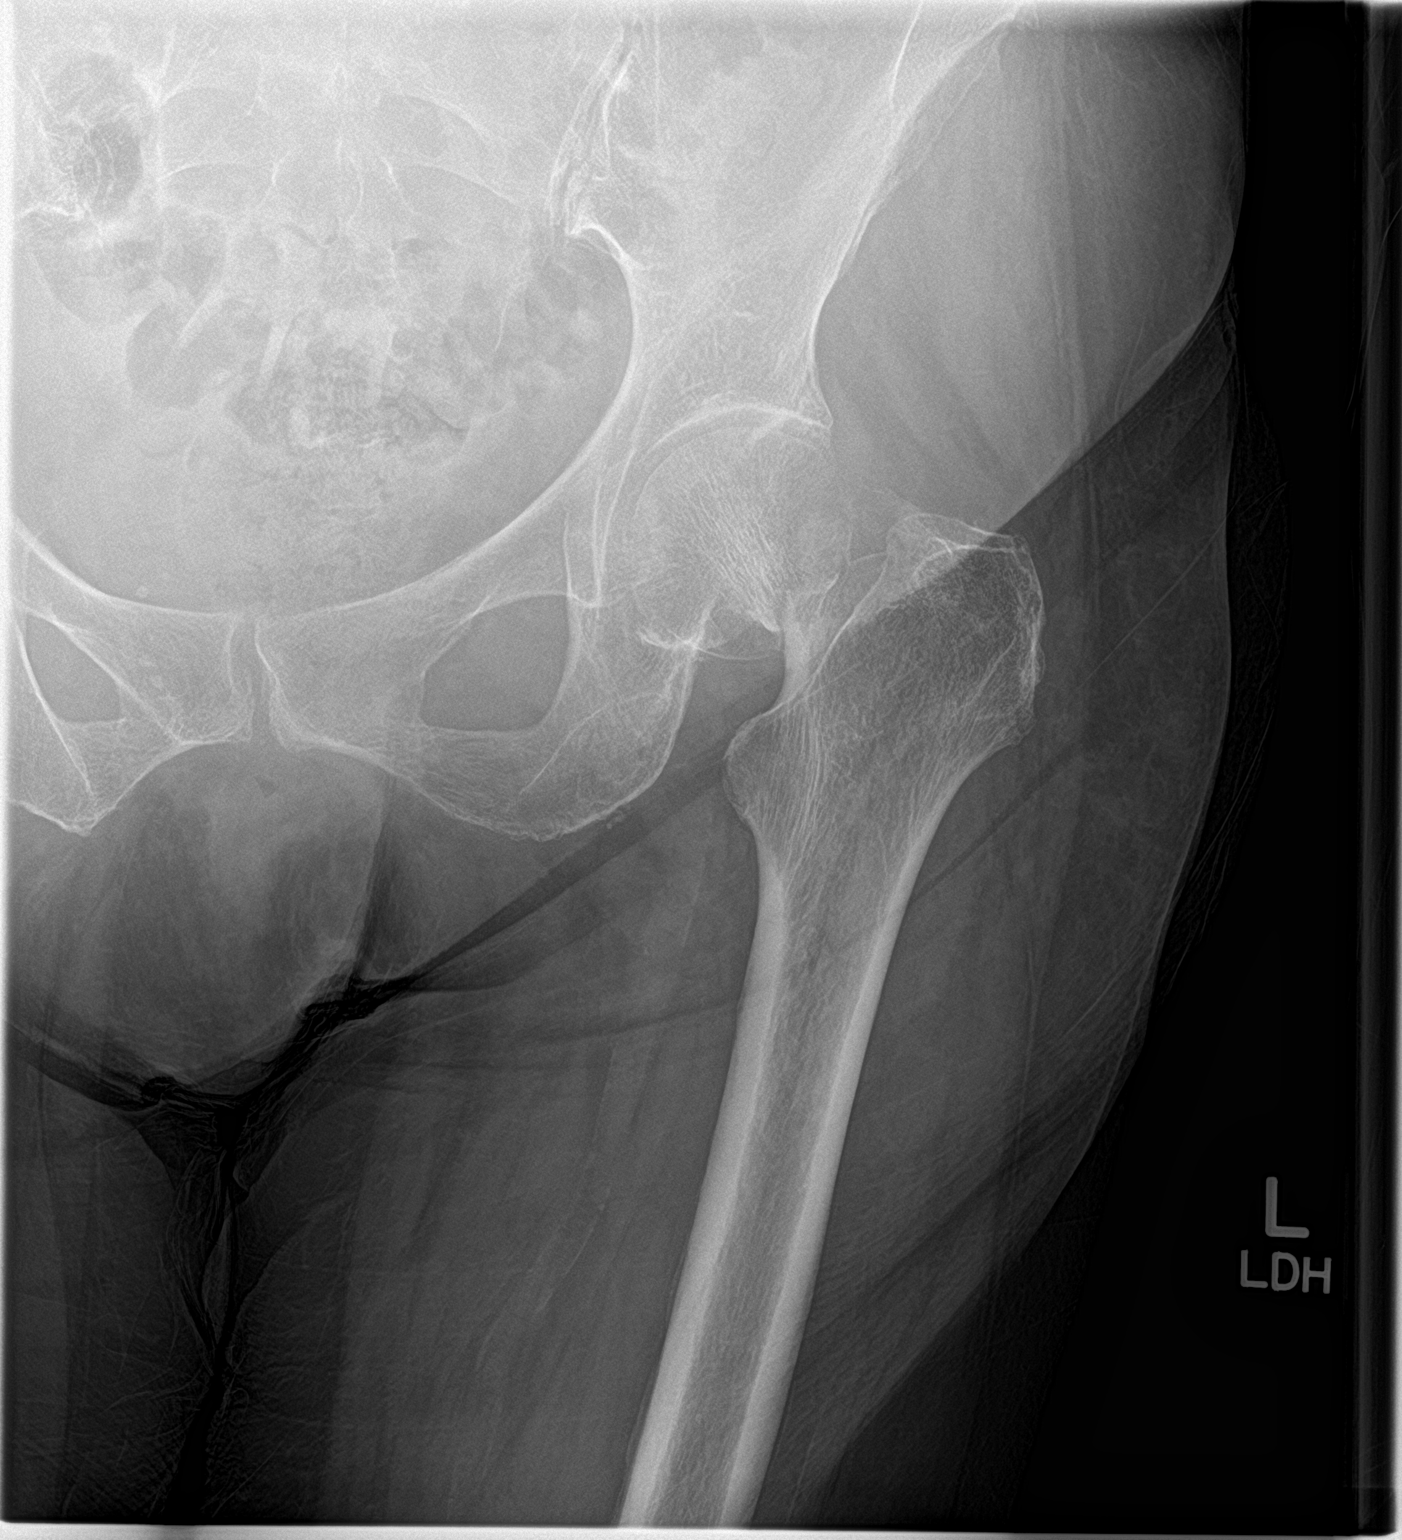
[im 3/3]
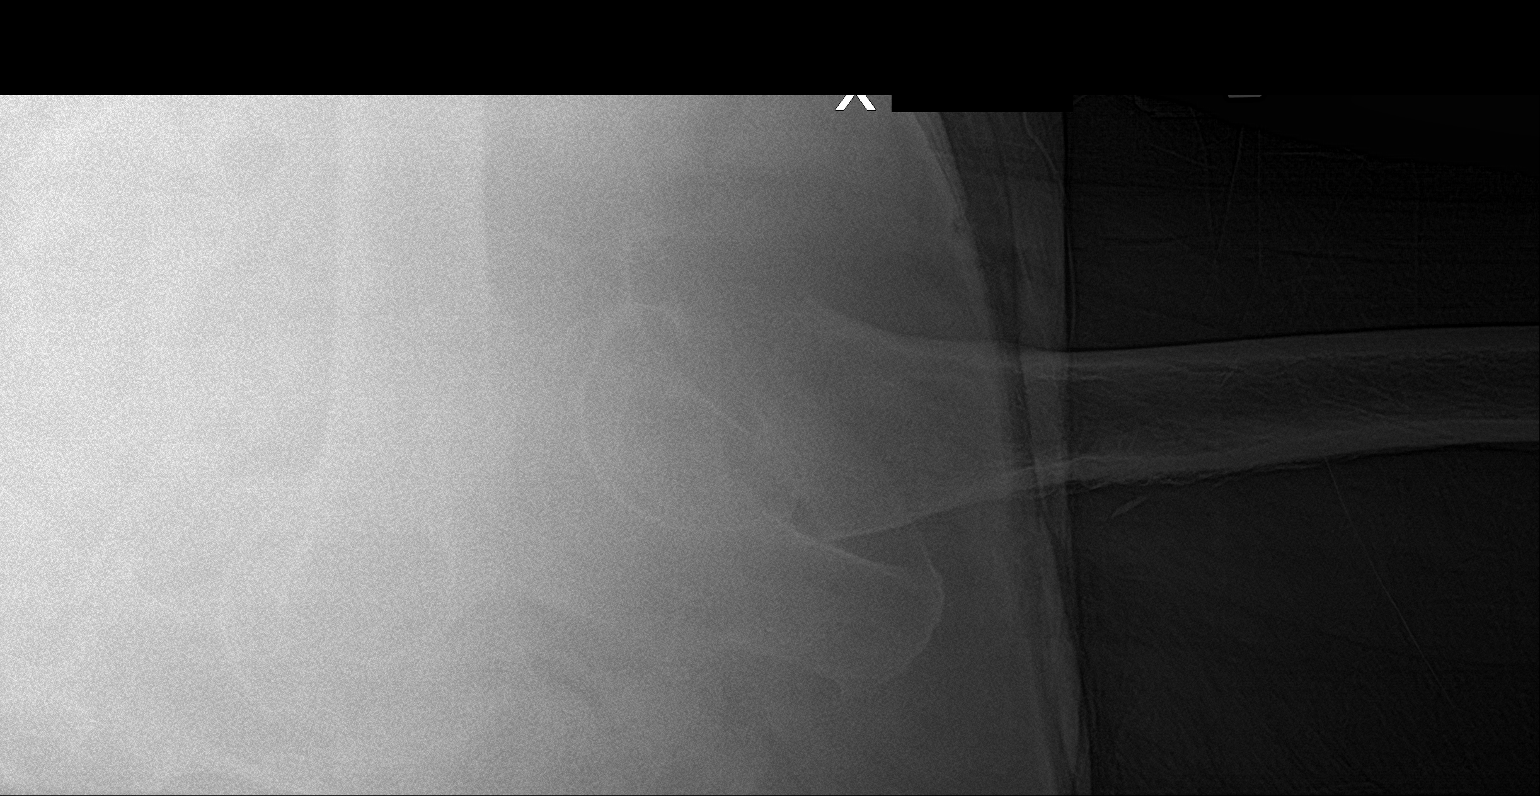

[3 of 3 positions shown; findings below may reference images not displayed]

FINDINGS: Osteopenia.

Bony pelvic ring intact with no pelvic fracture identified.

Left-sided subcapital hip fracture with minimal displacement.

Right hip maintains alignment with mild degenerative changes.
IMPRESSION: Left subcapital hip fracture with mild displacement.

Osteopenia.

## 2020-04-12 IMAGING — CR DG ANKLE COMPLETE 3+V*L*
1 series · 3 of 3 positions shown · non-contrast
Comparison: 03/02/2018.

CLINICAL DATA: Fall.

EXAM:
LEFT ANKLE COMPLETE - 3+ VIEW

[Series 1: dg ankle complete left · 0.14mm/px · 3 of 3 slices shown]
[im 1/3]
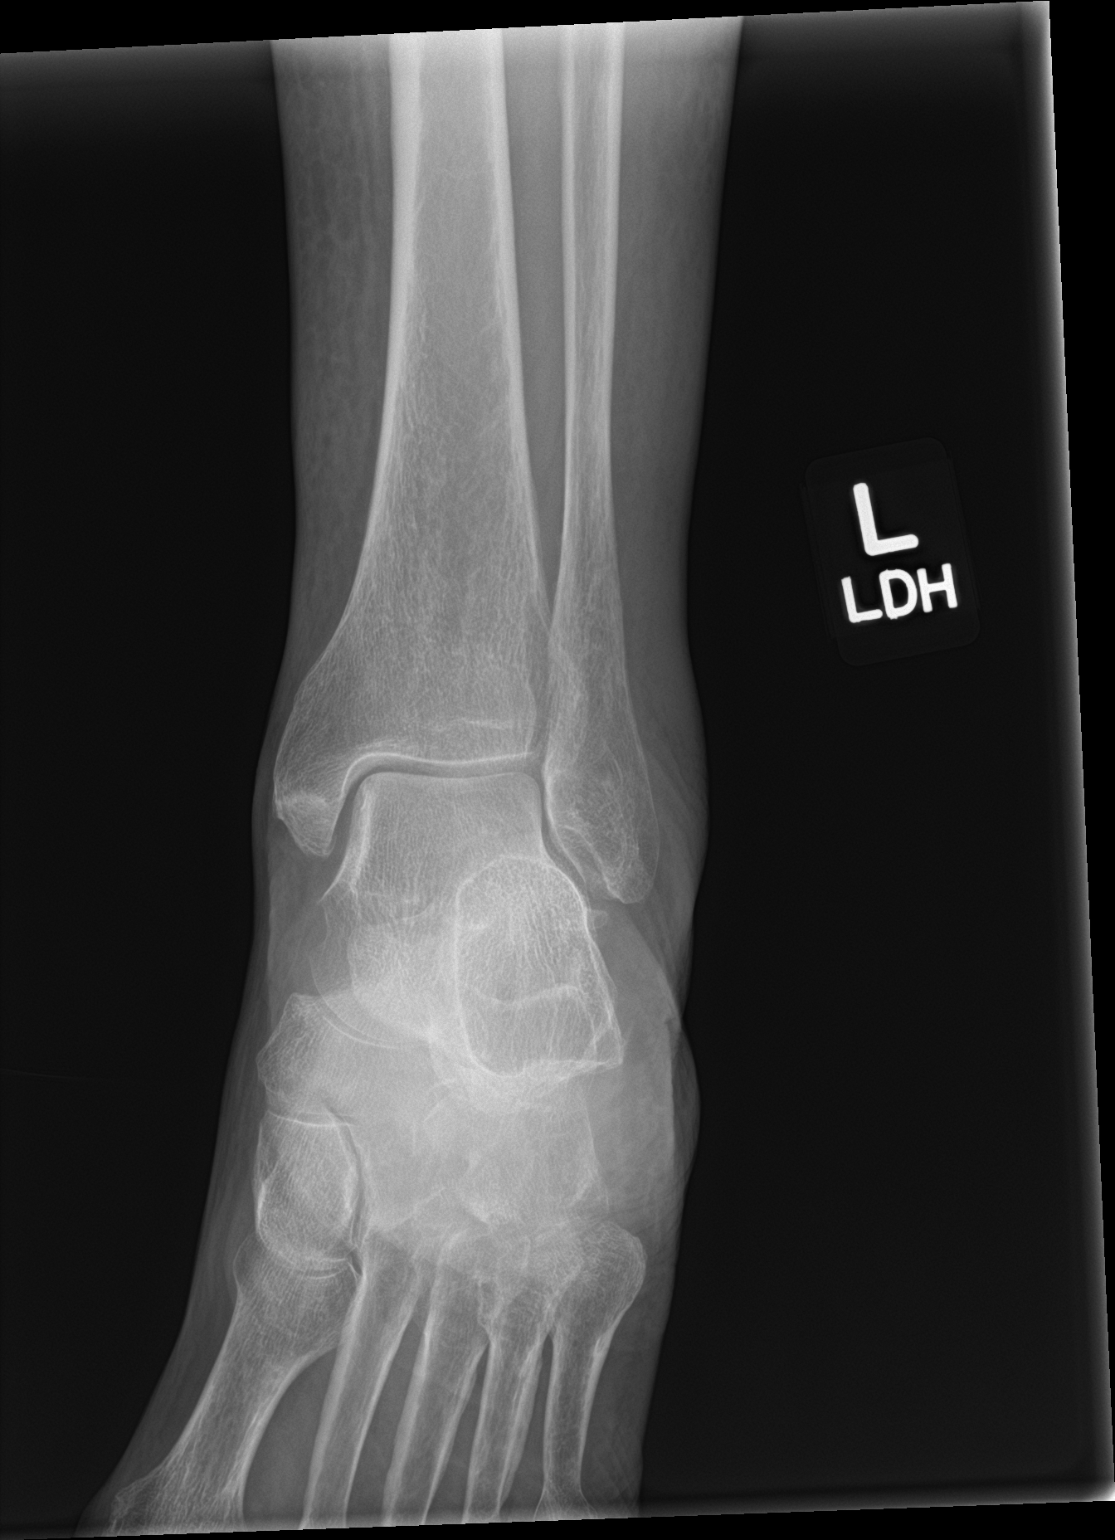
[im 2/3]
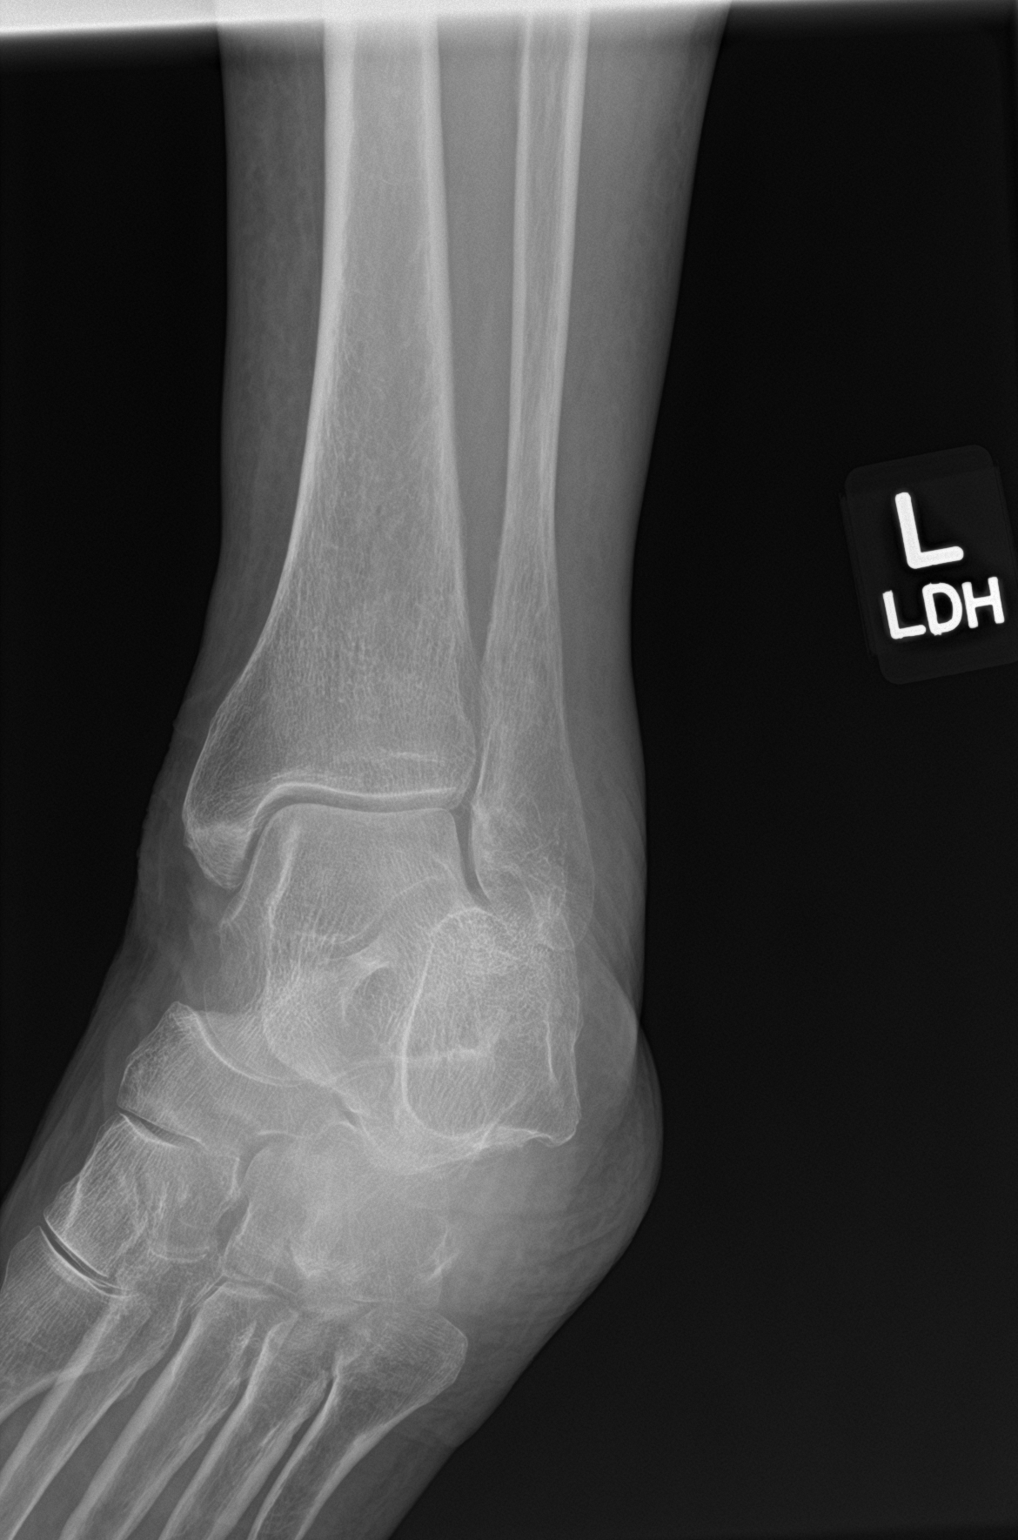
[im 3/3]
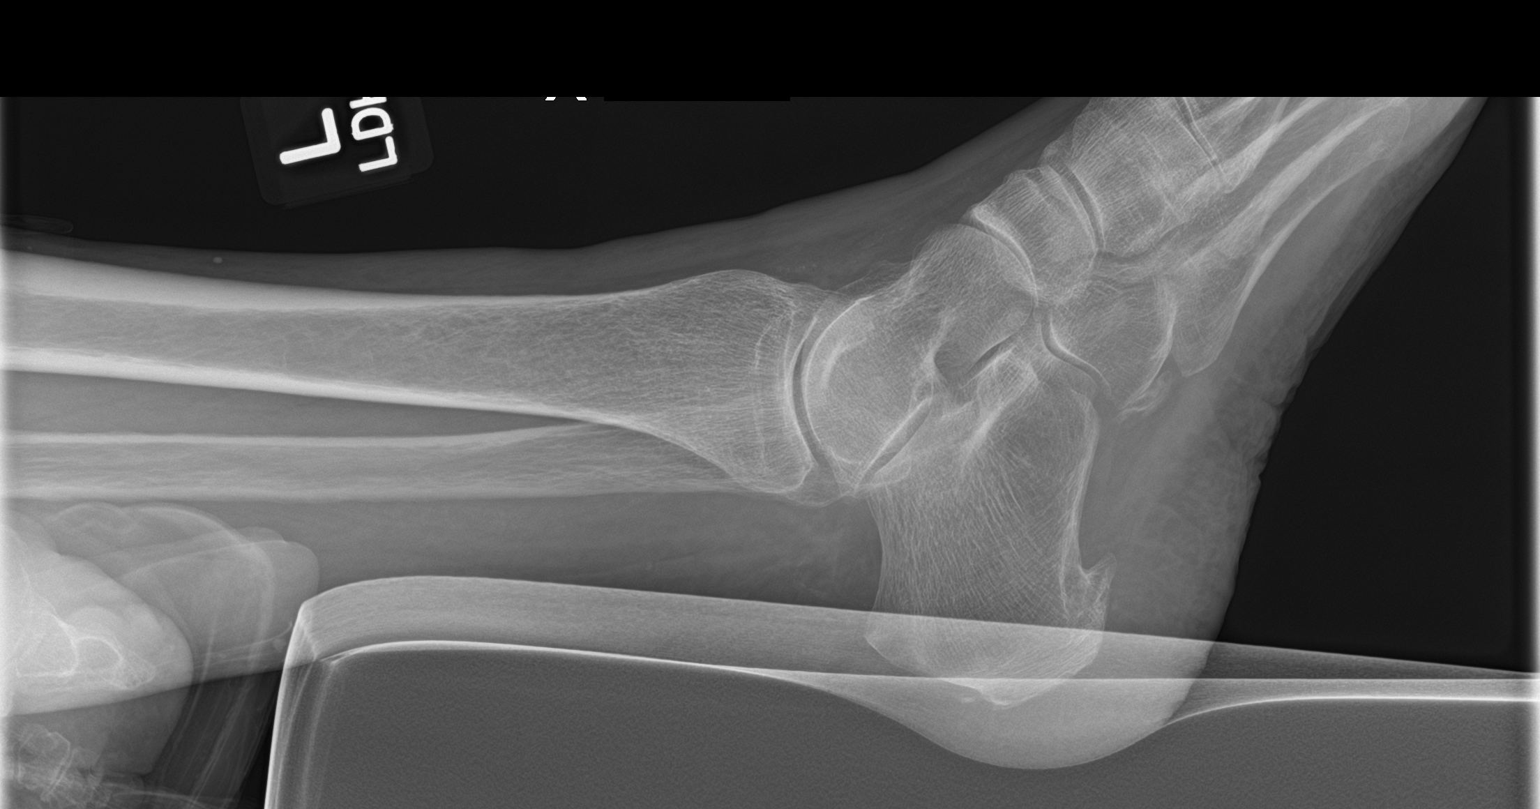

[3 of 3 positions shown; findings below may reference images not displayed]

FINDINGS: Soft tissue swelling noted lateral malleolus. Diffuse degenerative
change. Calcaneal spurring. Tiny bony density adjacent to the
lateral malleolus can not be excluded. This could represent a subtle
avulsion fracture, age undetermined. For vascular calcification.
IMPRESSION: 1. Soft tissue swelling over the lateral malleolus. Tiny bony
density adjacent to the lateral malleolus can not be excluded. This
could represent subtle avulsion fracture, age undetermined. No acute
abnormality otherwise noted.

2.  Peripheral vascular disease.

## 2020-04-12 NOTE — Progress Notes (Signed)
  Subjective:  Patient reports pain as mild.  Eating breakfast.  Objective:   VITALS:   Vitals:   04/11/20 1936 04/11/20 2300 04/12/20 0323 04/12/20 0809  BP: (!) 175/76 (!) 152/67 (!) 144/99 (!) 154/70  Pulse: 98 (!) 101 91 88  Resp: 16 18 16 16   Temp: 97.6 F (36.4 C) 97.9 F (36.6 C) 98 F (36.7 C) 98.1 F (36.7 C)  TempSrc:      SpO2: 94% 98% 95% 95%  Weight:      Height:        PHYSICAL EXAM:  Neurovascular intact Dorsiflexion/Plantar flexion intact Incision: dressing C/D/I No cellulitis present Compartment soft  LABS  No results found for this or any previous visit (from the past 24 hour(s)).  No results found.  Assessment/Plan: 3 Days Post-Op   Principal Problem:   Closed right hip fracture (HCC) Active Problems:   Essential hypertension, benign   Lower extremity edema   Dyslipidemia   Anemia of chronic disease   Vitamin D deficiency   Peripheral neuropathy    Up with therapy Aspirin 81 mg BID for 30 days Hemoglobin stable WBAT in RLE Discharge per hospitalist Follow up in 2 weeks for staple removal    , MD 04/12/2020, 9:01 AM

## 2020-04-12 NOTE — Progress Notes (Signed)
Physical Therapy Treatment Patient Details Name: Diane Rogers MRN: [id number] DOB: February 06, 1929 Today's Date: 04/12/2020    History of Present Illness Pt is an 87 y/o F with PMH that includes HTN, HLD, depression and fall with L hemiarthroplasty in Jan 2020. Pt now presents after sustaining mechanical fall with displaced right femoral neck fracture and is s/p right hip anterior hip hemiarthroplasty.    PT Comments    Pt tolerated treatment well today and was able to improve overall ambulation distance and pain levels. Despite progress, pt continues to be limited with generalized weakness, decreased balance, mild confusion, and FOF. Treatment focused on BLE therex for strengthening, safety with mobility, and education for pt/daughter for safety with mobility upon DC. Attempted log roll for bed mobility for safety at home, however, pt may benefit from sleeping in recliner/lift chair due to high assist levels; daughter verbalized understanding. Pt will continue to benefit from skilled acute PT services to address deficits for return to baseline function. Will continue to recommend SNF at DC. If family continues to decline, will recommend DC home with HHPT, 24/7 care, +2 assist for mobility, and 3in1.     Follow Up Recommendations  SNF;Supervision/Assistance - 24 hour     Equipment Recommendations  3in1 (PT)    Recommendations for Other Services       Precautions / Restrictions Precautions Precautions: Fall;Anterior Hip Precaution Booklet Issued: Yes (comment) Restrictions Weight Bearing Restrictions: Yes RLE Weight Bearing: Weight bearing as tolerated    Mobility  Bed Mobility Overal bed mobility: Needs Assistance Bed Mobility: Rolling;Sidelying to Sit Rolling: Max assist Sidelying to sit: Total assist Supine to sit: Max assist;HOB elevated Sit to supine: Total assist   General bed mobility comments: Attempted log roll technique for pt/family education for safety with bed  mobility at home. Max-total assist for bed mobility due to pain and FOF.    Transfers Overall transfer level: Needs assistance Equipment used: Rolling walker (2 wheeled) Transfers: Sit to/from Stand Sit to Stand: Mod assist;+2 physical assistance         General transfer comment: Mod assist +2 for STS transfers from elevated bed height in RW. Max multimodal cues for sequencing, safety, and set up. Pt able to tolerate standing for ~10sec for pericare after BM.  Ambulation/Gait Ambulation/Gait assistance: Mod assist;Max assist Gait Distance (Feet): 2 Feet Assistive device: Rolling walker (2 wheeled)   Gait velocity: decreased   General Gait Details: Initial mod assist to ambulate with RW, requiring max multimodal cues for sequencing and RW negotiation. Progressed to max assist due to fatigue. Pt with decreased RW proximity and increased hip flexion/posterior lean.     Balance Overall balance assessment: Needs assistance Sitting-balance support: Bilateral upper extremity supported;Feet supported Sitting balance-[name]: Fair Sitting balance - Comments: pt able to maintain static sitting balance with CGA for safety     Standing balance-[name]: Poor Standing balance comment: Required mod-max for upright mobility in RW                            Cognition Arousal/Alertness: Awake/alert Behavior During Therapy: [facility] for tasks assessed/performed Overall Cognitive Status: Within Functional Limits for tasks assessed                                 General Comments: Able to follow 100% of simple 1-step commands with mod repetition for attention to task  Exercises Total Joint Exercises Ankle Circles/Pumps: AROM;Strengthening;Both;10 reps Quad Sets: Strengthening;Both;10 reps Gluteal Sets: Strengthening;Both;10 reps Towel Squeeze: Strengthening;Both;10 reps Heel Slides: AROM;Strengthening;Both;10 reps Hip ABduction/ADduction:  Strengthening;Both;10 reps;AROM Straight Leg Raises: AROM;Strengthening;Both;10 reps (AAROM RLE) Long Arc Quad: AROM;Strengthening;Both;10 reps Knee Flexion: AROM;Strengthening;Both;10 reps;5 reps Other Exercises Other Exercises: Pt able to participate in bed mobility, transfers, and 67ft to recliner. Increased assist required due to fatigue, resulting in max assist for upright mobility. Other Exercises: Pt able to perform x10 BLE exercises including: ankle pumps, quad sets, glute sets, heel slides, SLR, LAQ, hip ABD/ADD, and isometric ADD. Pt only required AAROM for SLR on RLE. Other Exercises: Pt and daughter educated regarding: PT role/POC, safety with mobility/transfers, s/s related to lightheadedness, WB precautions, hip precautions, and progress with therapy.    General Comments General comments (skin integrity, edema, etc.): Initial mild lightheadedness with supine>sit transfer that subsided with BLE therex. Progressed to moderate lightheadedness with STS transfer. BP 136/101 mmHg after return to supine; RN notified.      Pertinent Vitals/Pain Pain Assessment: No/denies pain Faces Pain Scale: Hurts a little bit Pain Location: R hip Pain Descriptors / Indicators: Sore Pain Intervention(s): Monitored during session;RN gave pain meds during session;Repositioned           PT Goals (current goals can now be found in the care plan section) Acute Rehab PT Goals Patient Stated Goal: To walk better and get out of the house PT Goal Formulation: With patient Time For Goal Achievement: 04/23/20 Potential to Achieve Goals: Fair Progress towards PT goals: Progressing toward goals    Frequency    BID      PT Plan Current plan remains appropriate       AM-PAC PT "6 Clicks" Mobility   Outcome Measure  Help needed turning from your back to your side while in a flat bed without using bedrails?: A Lot Help needed moving from lying on your back to sitting on the side of a flat bed  without using bedrails?: A Lot Help needed moving to and from a bed to a chair (including a wheelchair)?: A Lot Help needed standing up from a chair using your arms (e.g., wheelchair or bedside chair)?: A Lot Help needed to walk in hospital room?: Total Help needed climbing 3-5 steps with a railing? : Total 6 Click Score: 10    End of Session Equipment Utilized During Treatment: Gait belt Activity Tolerance: Patient tolerated treatment well;Patient limited by fatigue Patient left: with call bell/phone within reach;with family/visitor present;in chair Nurse Communication: Mobility status;Weight bearing status PT Visit Diagnosis: Unsteadiness on feet (R26.81);History of falling (Z91.81);Muscle weakness (generalized) (M62.81);Difficulty in walking, not elsewhere classified (R26.2) Pain - Right/Left: Right Pain - part of body: Hip     Time: 7948-0165 PT Time Calculation (min) (ACUTE ONLY): 39 min  Charges:  $Therapeutic Exercise: 8-22 mins $Therapeutic Activity: 23-37 mins                     Vira Blanco, PT, DPT 3:10 PM,04/12/20

## 2020-04-12 NOTE — Progress Notes (Signed)
Progress Note    Diane Rogers  [id number]:[id number] DOB: 29-Jun-1928  DOA: 04/07/2020 PCP: [facility]      Brief Narrative:    Medical records reviewed and are as summarized below:  Diane Rogers is a 87 y.o. female with medical history significant of hypertension, hyperlipidemia, depression, impaired glucose tolerance presented to the ED with complaints of progressively worsening right hip pain.  She said she sustained a fall on 03/29/2020.  She presented to the ED on 04/07/2020.  She was found to have acute right femoral neck fracture.   Assessment/Plan:   Principal Problem:   Closed right hip fracture (HCC) Active Problems:   Essential hypertension, benign   Lower extremity edema   Dyslipidemia   Anemia of chronic disease   Vitamin D deficiency   Peripheral neuropathy   Nutrition Problem: Increased nutrient needs Etiology: post-op healing  Signs/Symptoms: estimated needs   Body mass index is 27.79 kg/m.     Closed right hip fracture s/p mechanical fall: S/p right anterior hip hemiarthroplasty on 04/09/2020.  Continue analgesics as needed for pain.  Follow-up with orthopedic surgeon.   Hypertension/Hypertensive urgency: Continue amlodipine  CKD stage IIIa: Creatinine stable.  Abnormal urinalysis: Urine culture showed multiple species.  She has already received 3 doses of IV Rocephin. Discontinue IV Rocephin.    Diet Order            Diet regular Room service appropriate? Yes; Fluid consistency: Thin  Diet effective now                    Consultants:  Orthopedic surgeon  Procedures:  Right hip anterior hip hemiarthroplasty on 04/09/2020    Medications:   . amLODipine  5 mg Oral Daily  . aspirin EC  81 mg Oral BID  . benazepril  20 mg Oral Daily  . busPIRone  5 mg Oral TID  . Chlorhexidine Gluconate Cloth  6 each Topical Daily  . docusate sodium  100 mg Oral BID  . feeding supplement  237 mL Oral BID BM  .   morphine injection  2 mg Intravenous Once  . multivitamin with minerals  1 tablet Oral Daily  . senna  1 tablet Oral BID  . traMADol  50 mg Oral Q6H  . tranexamic acid (CYKLOKAPRON) topical - INTRAOP  2,000 mg Topical Once   Continuous Infusions: . sodium chloride 10 mL/hr at 04/10/20 0554  . lactated ringers Stopped (04/11/20 9562)     Anti-infectives (From admission, onward)   Start     Dose/Rate Route Frequency Ordered Stop   04/08/20 1000  cefTRIAXone (ROCEPHIN) 1 g in sodium chloride 0.9 % 100 mL IVPB  Status:  Discontinued        1 g 200 mL/hr over 30 Minutes Intravenous Every 24 hours 04/08/20 0853 04/10/20 1135             Family Communication/Anticipated D/C date and plan/Code Status   DVT prophylaxis: SCDs Start: 04/09/20 2002 SCDs Start: 04/07/20 1727     Code Status: Full Code  Family Communication: Discussed with her daughter, Diane Rogers, at the bedside Disposition Plan:    Status is: Inpatient  Remains inpatient appropriate because:Unsafe d/c plan   Dispo: The patient is from: Home              Anticipated d/c is to: Home              Patient currently is not medically  stable to d/c.   Difficult to place patient No           Subjective:   C/o hip pain.  Her daughter, Diane Rogers, is at the bedside.  Her nurse was also at the bedside.  Objective:    Vitals:   04/12/20 0323 04/12/20 0809 04/12/20 1127 04/12/20 1208  BP: (!) 144/99 (!) 154/70 (!) 136/101 (!) 148/60  Pulse: 91 88  74  Resp: 16 16  16   Temp: 98 F (36.7 C) 98.1 F (36.7 C)  97.6 F (36.4 C)  TempSrc:      SpO2: 95% 95%  95%  Weight:      Height:       No data found.   Intake/Output Summary (Last 24 hours) at 04/12/2020 1436 Last data filed at 04/12/2020 0500 Gross per 24 hour  Intake 240 ml  Output 300 ml  Net -60 ml   Filed Weights   04/07/20 1402 04/07/20 2052 04/09/20 1408  Weight: 64 kg 66.7 kg 66.7 kg    Exam:  GEN: NAD SKIN: Warm and dry EYES: No pallor  or icterus ENT: MMM CV: RRR PULM: CTA B ABD: soft, ND, NT, +BS CNS: AAO x 3, non focal EXT: Dressing on right hip surgical wound is clean, dry and intact.       Data Reviewed:   I have personally reviewed following labs and imaging studies:  Labs: Labs show the following:   Basic Metabolic Panel: Recent Labs  Lab 04/07/20 1629 04/08/20 0514  NA 136 140  K 4.1 4.1  CL 102 105  CO2 25 28  GLUCOSE 105* 103*  BUN 21 23  CREATININE 1.05* 0.96  CALCIUM 8.9 8.6*   GFR Estimated Creatinine Clearance: 34.7 mL/min (by C-G formula based on SCr of 0.96 mg/dL). Liver Function Tests: No results for input(s): AST, ALT, ALKPHOS, BILITOT, PROT, ALBUMIN in the last 168 hours. No results for input(s): LIPASE, AMYLASE in the last 168 hours. No results for input(s): AMMONIA in the last 168 hours. Coagulation profile Recent Labs  Lab 04/07/20 1629  INR 1.1    CBC: Recent Labs  Lab 04/07/20 1629 04/08/20 0514 04/09/20 0429 04/10/20 0618 04/11/20 0430  WBC 15.0* 12.2* 12.3* 13.2* 12.5*  NEUTROABS 12.4*  --   --  11.0* 10.1*  HGB 14.8 13.7 13.8 12.3 11.8*  HCT 43.4 41.7 43.1 36.9 36.8  MCV 92.3 93.1 93.3 92.7 93.6  PLT 254 231 250 199 195   Cardiac Enzymes: No results for input(s): CKTOTAL, CKMB, CKMBINDEX, TROPONINI in the last 168 hours. BNP (last 3 results) No results for input(s): PROBNP in the last 8760 hours. CBG: No results for input(s): GLUCAP in the last 168 hours. D-Dimer: No results for input(s): DDIMER in the last 72 hours. Hgb A1c: No results for input(s): HGBA1C in the last 72 hours. Lipid Profile: No results for input(s): CHOL, HDL, LDLCALC, TRIG, CHOLHDL, LDLDIRECT in the last 72 hours. Thyroid function studies: No results for input(s): TSH, T4TOTAL, T3FREE, THYROIDAB in the last 72 hours.  Invalid input(s): FREET3 Anemia work up: No results for input(s): VITAMINB12, FOLATE, FERRITIN, TIBC, IRON, RETICCTPCT in the last 72 hours. Sepsis  Labs: Recent Labs  Lab 04/08/20 0514 04/09/20 0429 04/10/20 0618 04/11/20 0430  WBC 12.2* 12.3* 13.2* 12.5*    Microbiology Recent Results (from the past 240 hour(s))  Resp Panel by RT-PCR (Flu A&B, Covid) Nasopharyngeal Swab     Status: None   Collection Time: 04/07/20  5:16 PM  Specimen: Nasopharyngeal Swab; Nasopharyngeal(NP) swabs in vial transport medium  Result Value Ref Range Status   SARS Coronavirus 2 by RT PCR NEGATIVE NEGATIVE Final    Comment: (NOTE) SARS-CoV-2 target nucleic acids are NOT DETECTED.  The SARS-CoV-2 RNA is generally detectable in upper respiratory specimens during the acute phase of infection. The lowest concentration of SARS-CoV-2 viral copies this assay can detect is 138 copies/mL. A negative result does not preclude SARS-Cov-2 infection and should not be used as the sole basis for treatment or other patient management decisions. A negative result may occur with  improper specimen collection/handling, submission of specimen other than nasopharyngeal swab, presence of viral mutation(s) within the areas targeted by this assay, and inadequate number of viral copies(<138 copies/mL). A negative result must be combined with clinical observations, patient history, and epidemiological information. The expected result is Negative.  Fact Sheet for Patients:  BloggerCourse.com  Fact Sheet for Healthcare Providers:  SeriousBroker.it  This test is no t yet approved or cleared by the [place] FDA and  has been authorized for detection and/or diagnosis of SARS-CoV-2 by FDA under an Emergency Use Authorization (EUA). This EUA will remain  in effect (meaning this test can be used) for the duration of the COVID-19 declaration under Section 564(b)(1) of the Act, 21 U.S.C.section 360bbb-3(b)(1), unless the authorization is terminated  or revoked sooner.       Influenza A by PCR NEGATIVE NEGATIVE Final    Influenza B by PCR NEGATIVE NEGATIVE Final    Comment: (NOTE) The Xpert Xpress SARS-CoV-2/FLU/RSV plus assay is intended as an aid in the diagnosis of influenza from Nasopharyngeal swab specimens and should not be used as a sole basis for treatment. Nasal washings and aspirates are unacceptable for Xpert Xpress SARS-CoV-2/FLU/RSV testing.  Fact Sheet for Patients: BloggerCourse.com  Fact Sheet for Healthcare Providers: SeriousBroker.it  This test is not yet approved or cleared by the [place] FDA and has been authorized for detection and/or diagnosis of SARS-CoV-2 by FDA under an Emergency Use Authorization (EUA). This EUA will remain in effect (meaning this test can be used) for the duration of the COVID-19 declaration under Section 564(b)(1) of the Act, 21 U.S.C. section 360bbb-3(b)(1), unless the authorization is terminated or revoked.  Performed at [facility], [address], [place], [place] [address]   Surgical pcr screen     Status: None   Collection Time: 04/08/20  1:33 AM   Specimen: Nasal Mucosa; Nasal Swab  Result Value Ref Range Status   MRSA, PCR NEGATIVE NEGATIVE Final   Staphylococcus aureus NEGATIVE NEGATIVE Final    Comment: (NOTE) The Xpert SA Assay (FDA approved for NASAL specimens in patients 32 years of age and older), is one component of a comprehensive surveillance program. It is not intended to diagnose infection nor to guide or monitor treatment. Performed at [facility], [address] [place], [place] [address]   Urine Culture     Status: Abnormal   Collection Time: 04/08/20  3:30 AM   Specimen: Urine, Random  Result Value Ref Range Status   Specimen Description   Final    URINE, RANDOM Performed at [facility], [address], [place], [place] [address]    Special Requests   Final    NONE Performed at [facility], [address]  [place], [place] [address]    Culture MULTIPLE SPECIES PRESENT, SUGGEST RECOLLECTION (A)  Final   Report Status 04/09/2020 FINAL  Final  Procedures and diagnostic studies:  No results found.             LOS: 5 days   Son Barkan  [facility]   Pager on [email or web address]. If 7PM-7AM, please contact night-coverage at [email or web address]     04/12/2020, 2:36 PM               Progress Note    Diane Rogers  [id number] DOB: [birth year]  DOA: 04/07/2020 [facility]      Brief Narrative:    Medical records reviewed and are as summarized below:  Diane Clampleanor D Seiber is a 87 y.o. female with medical history significant of hypertension, hyperlipidemia, depression, impaired glucose tolerance presented to the ED with complaints of progressively worsening right hip pain.  She said she sustained a fall on 03/29/2020.  She presented to the ED on 04/07/2020.  She was found to have acute right femoral neck fracture.   Assessment/Plan:   Principal Problem:   Closed right hip fracture (HCC) Active Problems:   Essential hypertension, benign   Lower extremity edema   Dyslipidemia   Anemia of chronic disease   Vitamin D deficiency   Peripheral neuropathy   Nutrition Problem: Increased nutrient needs Etiology: post-op healing  Signs/Symptoms: estimated needs   Body mass index is 27.79 kg/m.     Closed right hip fracture s/p mechanical fall: S/p right hip anterior hip hemiarthroplasty on 04/09/2020.  Continue analgesics as needed for pain.  PT continues to recommend discharge to SNF but her daughter wants her to go home with home health therapy.  According to the social worker, home health therapy services will not be available until Monday, 04/14/2020.  Delirium/acute confusional state: Continue supportive care and reorientation techniques.  Hypertension/Hypertensive urgency: Continue antihypertensives.  Monitor BP closely..  CKD  stage IIIa: Creatinine stable.  Abnormal urinalysis: Urine culture showed multiple species.  UTI is unlikely.  Completed 3 doses of IV Rocephin.  Plan to discharge to home tomorrow if confusion improves.   Diet Order            Diet regular Room service appropriate? Yes; Fluid consistency: Thin  Diet effective now                    Consultants:  Orthopedic surgeon  Procedures:  Right hip anterior hip hemiarthroplasty on 04/09/2020    Medications:   . amLODipine  5 mg Oral Daily  . aspirin EC  81 mg Oral BID  . benazepril  20 mg Oral Daily  . busPIRone  5 mg Oral TID  . Chlorhexidine Gluconate Cloth  6 each Topical Daily  . docusate sodium  100 mg Oral BID  . feeding supplement  237 mL Oral BID BM  .  morphine injection  2 mg Intravenous Once  . multivitamin with minerals  1 tablet Oral Daily  . senna  1 tablet Oral BID  . traMADol  50 mg Oral Q6H  . tranexamic acid (CYKLOKAPRON) topical - INTRAOP  2,000 mg Topical Once   Continuous Infusions: . sodium chloride 10 mL/hr at 04/10/20 0554  . lactated ringers Stopped (04/11/20 [id number])     Anti-infectives (From admission, onward)   Start     Dose/Rate Route Frequency Ordered Stop   04/08/20 1000  cefTRIAXone (ROCEPHIN) 1 g in sodium chloride 0.9 % 100 mL IVPB  Status:  Discontinued        1 g 200 mL/hr over 30 Minutes Intravenous Every 24 hours  04/08/20 0853 04/10/20 1135             Family Communication/Anticipated D/C date and plan/Code Status   DVT prophylaxis: SCDs Start: 04/09/20 2002 SCDs Start: 04/07/20 1727     Code Status: Full Code  Family Communication: None Disposition Plan:    Status is: Inpatient  Remains inpatient appropriate because:Unsafe d/c plan   Dispo: The patient is from: Home              Anticipated d/c is to: Home              Patient currently is not medically stable to d/c.   Difficult to place patient No           Subjective:   Interval events noted.   Patient is more confused today.  She thinks she is at her friend's house.  Objective:    Vitals:   04/12/20 0323 04/12/20 0809 04/12/20 1127 04/12/20 1208  BP: (!) 144/99 (!) 154/70 (!) 136/101 (!) 148/60  Pulse: 91 88  74  Resp: Temp: 98 F (36.7 C) 98.1 F (36.7 C)  97.6 F (36.4 C)  TempSrc:      SpO2: 95% 95%  95%  Weight:      Height:       No data found.   Intake/Output Summary (Last 24 hours) at 04/12/2020 1436 Last data filed at 04/12/2020 0500 Gross per 24 hour  Intake 240 ml  Output 300 ml  Net -60 ml   Filed Weights   04/07/20 1402 04/07/20 2052 04/09/20 1408  Weight: 64 kg 66.7 kg 66.7 kg    Exam:  GEN: NAD SKIN: No rash EYES: EOMI ENT: MMM CV: RRR PULM: CTA B ABD: soft, ND, NT, +BS CNS: AAO x 1 (person), confused, non focal EXT: Right hip tenderness.  Dressing on right hip surgical wound is clean, dry and intact.        Data Reviewed:   I have personally reviewed following labs and imaging studies:  Labs: Labs show the following:   Basic Metabolic Panel: Recent Labs  Lab 04/07/20 1629 04/08/20 0514  NA 136 140  K 4.1 4.1  CL 102 105  CO2 25 28  GLUCOSE 105* 103*  BUN 21 23  CREATININE 1.05* 0.96  CALCIUM 8.9 8.6*   GFR Estimated Creatinine Clearance: 34.7 mL/min (by C-G formula based on SCr of 0.96 mg/dL). Liver Function Tests: No results for input(s): AST, ALT, ALKPHOS, BILITOT, PROT, ALBUMIN in the last 168 hours. No results for input(s): LIPASE, AMYLASE in the last 168 hours. No results for input(s): AMMONIA in the last 168 hours. Coagulation profile Recent Labs  Lab 04/07/20 1629  INR 1.1    CBC: Recent Labs  Lab 04/07/20 1629 04/08/20 0514 04/09/20 0429 04/10/20 0618 04/11/20 0430  WBC 15.0* 12.2* 12.3* 13.2* 12.5*  NEUTROABS 12.4*  --   --  11.0* 10.1*  HGB 14.8 13.7 13.8 12.3 11.8*  HCT 43.4 41.7 43.1 36.9 36.8  MCV 92.3 93.1 93.3 92.7 93.6  PLT 254 231 250 199 195   Cardiac Enzymes: No  results for input(s): CKTOTAL, CKMB, CKMBINDEX, TROPONINI in the last 168 hours. BNP (last 3 results) No results for input(s): PROBNP in the last 8760 hours. CBG: No results for input(s): GLUCAP in the last 168 hours. D-Dimer: No results for input(s): DDIMER in the last 72 hours. Hgb A1c: No results for input(s): HGBA1C in the last 72 hours. Lipid Profile:  No results for input(s): CHOL, HDL, LDLCALC, TRIG, CHOLHDL, LDLDIRECT in the last 72 hours. Thyroid function studies: No results for input(s): TSH, T4TOTAL, T3FREE, THYROIDAB in the last 72 hours.  Invalid input(s): FREET3 Anemia work up: No results for input(s): VITAMINB12, FOLATE, FERRITIN, TIBC, IRON, RETICCTPCT in the last 72 hours. Sepsis Labs: Recent Labs  Lab 04/08/20 0514 04/09/20 0429 04/10/20 0618 04/11/20 0430  WBC 12.2* 12.3* 13.2* 12.5*    Microbiology Recent Results (from the past 240 hour(s))  Resp Panel by RT-PCR (Flu A&B, Covid) Nasopharyngeal Swab     Status: None   Collection Time: 04/07/20  5:16 PM   Specimen: Nasopharyngeal Swab; Nasopharyngeal(NP) swabs in vial transport medium  Result Value Ref Range Status   SARS Coronavirus 2 by RT PCR NEGATIVE NEGATIVE Final    Comment: (NOTE) SARS-CoV-2 target nucleic acids are NOT DETECTED.  The SARS-CoV-2 RNA is generally detectable in upper respiratory specimens during the acute phase of infection. The lowest concentration of SARS-CoV-2 viral copies this assay can detect is 138 copies/mL. A negative result does not preclude SARS-Cov-2 infection and should not be used as the sole basis for treatment or other patient management decisions. A negative result may occur with  improper specimen collection/handling, submission of specimen other than nasopharyngeal swab, presence of viral mutation(s) within the areas targeted by this assay, and inadequate number of viral copies(<138 copies/mL). A negative result must be combined with clinical observations, patient  history, and epidemiological information. The expected result is Negative.  Fact Sheet for Patients:  BloggerCourse.com  Fact Sheet for Healthcare Providers:  SeriousBroker.it  This test is no t yet approved or cleared by the [place] FDA and  has been authorized for detection and/or diagnosis of SARS-CoV-2 by FDA under an Emergency Use Authorization (EUA). This EUA will remain  in effect (meaning this test can be used) for the duration of the COVID-19 declaration under Section 564(b)(1) of the Act, 21 U.S.C.section 360bbb-3(b)(1), unless the authorization is terminated  or revoked sooner.       Influenza A by PCR NEGATIVE NEGATIVE Final   Influenza B by PCR NEGATIVE NEGATIVE Final    Comment: (NOTE) The Xpert Xpress SARS-CoV-2/FLU/RSV plus assay is intended as an aid in the diagnosis of influenza from Nasopharyngeal swab specimens and should not be used as a sole basis for treatment. Nasal washings and aspirates are unacceptable for Xpert Xpress SARS-CoV-2/FLU/RSV testing.  Fact Sheet for Patients: BloggerCourse.com  Fact Sheet for Healthcare Providers: SeriousBroker.it  This test is not yet approved or cleared by the [place] FDA and has been authorized for detection and/or diagnosis of SARS-CoV-2 by FDA under an Emergency Use Authorization (EUA). This EUA will remain in effect (meaning this test can be used) for the duration of the COVID-19 declaration under Section 564(b)(1) of the Act, 21 U.S.C. section 360bbb-3(b)(1), unless the authorization is terminated or revoked.  Performed at [facility], [address] [place], [place] [address]   Surgical pcr screen     Status: None   Collection Time: 04/08/20  1:33 AM   Specimen: Nasal Mucosa; Nasal Swab  Result Value Ref Range Status   MRSA, PCR NEGATIVE NEGATIVE Final   Staphylococcus aureus  NEGATIVE NEGATIVE Final    Comment: (NOTE) The Xpert SA Assay (FDA approved for NASAL specimens in patients 76 years of age and older), is one component of a comprehensive surveillance program. It is not intended to diagnose infection nor to guide or monitor treatment. Performed at  [facility], [address] [place], [place] [address]   Urine Culture     Status: Abnormal   Collection Time: 04/08/20  3:30 AM   Specimen: Urine, Random  Result Value Ref Range Status   Specimen Description   Final    URINE, RANDOM Performed at [facility], [address], [place], [place] [address]    Special Requests   Final    NONE Performed at [facility], [address] [place], [place] [address]    Culture MULTIPLE SPECIES PRESENT, SUGGEST RECOLLECTION (A)  Final   Report Status 04/09/2020 FINAL  Final    Procedures and diagnostic studies:  No results found.             LOS: 5 days   Merisa Julio  [facility]   Pager on [email or web address]. If 7PM-7AM, please contact night-coverage at [email or web address]     04/12/2020, 2:36 PM

## 2020-04-12 NOTE — Progress Notes (Signed)
Physical Therapy Treatment Patient Details Name: Diane Rogers MRN: [id number] DOB: 1928/08/30 Today's Date: 04/12/2020    History of Present Illness Pt is an 87 y/o F with PMH that includes HTN, HLD, depression and fall with L hemiarthroplasty in Jan 2020. Pt now presents after sustaining mechanical fall with displaced right femoral neck fracture and is s/p right hip anterior hip hemiarthroplasty.    PT Comments    Pt tolerated treatment well today, but further participation limited secondary to c/o lightheadedness with upright positioning. Pt able to perform x10 BLE therex with AROM except for RLE SLR, noting improved strength since last session. Despite progress, pt continues to be limited with meeting goals due to generalized weakness, increased pain, mild confusion, and decreased balance. Pt required max-total assist for bed mobility, mod assist +2 for transfers, and min assist +2 to take ~2 steps at bedside with RW. Daughter present in room during session, and provided education regarding safety for pt and caregiver with bed mobility, transfers, and ambulation. Pt will continue to benefit from skilled acute PT services to address deficits for return to baseline function. Will continue to recommend SNF at [place] as pt currently requires increased assist for all mobility and is significantly limited with OOB mobility. If family continues to decline, will recommend home with HHPT, 24/7 care with +2 assist for OOB mobility, and 3in1.  Of note, daughter with concerns for frequency of care with HHPT at [place]. PT explained that HHPT will evaluate pt and create POC with goals, which will determine frequency and duration of care. Daughter with hopes for pt to be seen 2x/day at home, since pt is being seen 2x/day in the hospital.    Follow Up Recommendations  SNF;Supervision/Assistance - 24 hour     Equipment Recommendations  3in1 (PT)    Recommendations for Other Services       Precautions /  Restrictions Precautions Precautions: Fall;Anterior Hip Precaution Booklet Issued: Yes (comment) Restrictions Weight Bearing Restrictions: Yes RLE Weight Bearing: Weight bearing as tolerated    Mobility  Bed Mobility Overal bed mobility: Needs Assistance Bed Mobility: Supine to Sit;Sit to Supine     Supine to sit: Max assist;HOB elevated Sit to supine: Total assist   General bed mobility comments: Max-total assist for supine<>sit transfers with multimodal cues for sequencing and safety. Increased time/effort due to fatigue.    Transfers Overall transfer level: Needs assistance Equipment used: Rolling walker (2 wheeled) Transfers: Sit to/from Stand Sit to Stand: Mod assist;+2 physical assistance         General transfer comment: Mod assist +2 for STS transfers from elevated bed height x2 in RW. Max multimodal cues for sequencing, safety, and set up. Pt able to tolerate standing x2 for ~10sec each time; limited secondary to c/o mod lightheadedness and fatigue.  Ambulation/Gait Ambulation/Gait assistance: Min assist;+2 physical assistance Gait Distance (Feet): 1 Feet Assistive device: Rolling walker (2 wheeled)   Gait velocity: decreased   General Gait Details: Min assist +2 to take ~2 steps at bedside with RW. Max multimodal cues for sequencing and RW negotiation. Decreased step length/foot clearance bil due to fatigue/lightheadedness.    Balance Overall balance assessment: Needs assistance Sitting-balance support: Bilateral upper extremity supported;Feet supported Sitting balance-[name]: Fair Sitting balance - Comments: pt able to maintain static sitting balance with CGA for safety     Standing balance-[name]: Poor Standing balance comment: Pt required occasional min A +2 for stability in standing  Cognition Arousal/Alertness: Awake/alert Behavior During Therapy: WFL for tasks assessed/performed Overall Cognitive  Status: Within Functional Limits for tasks assessed                                 General Comments: A&O x4 during session and able to follow 100% of simple 1-step commands with mod repetition for attention to task      Exercises Total Joint Exercises Ankle Circles/Pumps: AROM;Strengthening;Both;10 reps Quad Sets: Strengthening;Both;10 reps Gluteal Sets: Strengthening;Both;10 reps Towel Squeeze: Strengthening;Both;10 reps Heel Slides: AROM;Strengthening;Both;10 reps Hip ABduction/ADduction: Strengthening;Both;10 reps;AROM Straight Leg Raises: AROM;Strengthening;Both;10 reps (AAROM RLE) Long Arc Quad: AROM;Strengthening;Both;10 reps Other Exercises Other Exercises: Pt able to participate in bed mobility, transfers, and ~2 steps at bedside with increased assist. Mobility limited secondary to pt c/o mild-moderate lightheadedness. Other Exercises: Pt able to perform x10 BLE exercises including: ankle pumps, quad sets, glute sets, heel slides, SLR, LAQ, hip ABD/ADD, and isometric ADD. Pt only required AAROM for SLR on RLE. Other Exercises: Pt and daughter educated regarding: PT role/POC, safety with mobility/transfers, s/s related to lightheadedness, WB precautions, hip precautions, and progress with therapy.    General Comments General comments (skin integrity, edema, etc.): Initial mild lightheadedness with supine>sit transfer that subsided with BLE therex. Progressed to moderate lightheadedness with STS transfer. BP 136/101 mmHg after return to supine; RN notified.      Pertinent Vitals/Pain Pain Assessment: Faces Faces Pain Scale: Hurts a little bit Pain Location: R hip Pain Descriptors / Indicators: Sore Pain Intervention(s): Monitored during session;RN gave pain meds during session;Repositioned           PT Goals (current goals can now be found in the care plan section) Acute Rehab PT Goals Patient Stated Goal: To walk better and get out of the house PT Goal  Formulation: With patient Time For Goal Achievement: 04/23/20 Potential to Achieve Goals: Fair Progress towards PT goals: Progressing toward goals    Frequency    BID      PT Plan Current plan remains appropriate       AM-PAC PT "6 Clicks" Mobility   Outcome Measure  Help needed turning from your back to your side while in a flat bed without using bedrails?: A Lot Help needed moving from lying on your back to sitting on the side of a flat bed without using bedrails?: A Lot Help needed moving to and from a bed to a chair (including a wheelchair)?: A Lot Help needed standing up from a chair using your arms (e.g., wheelchair or bedside chair)?: A Lot Help needed to walk in hospital room?: Total Help needed climbing 3-5 steps with a railing? : Total 6 Click Score: 10    End of Session Equipment Utilized During Treatment: Gait belt Activity Tolerance: Patient tolerated treatment well Patient left: in bed;with call bell/phone within reach;with bed alarm set;with SCD's reapplied;with family/visitor present Nurse Communication: Mobility status;Weight bearing status;Other (comment) PT Visit Diagnosis: Unsteadiness on feet (R26.81);History of falling (Z91.81);Muscle weakness (generalized) (M62.81);Difficulty in walking, not elsewhere classified (R26.2);Pain Pain - Right/Left: Right Pain - part of body: Hip     Time: 4696-2952 PT Time Calculation (min) (ACUTE ONLY): 51 min  Charges:  $Therapeutic Exercise: 23-37 mins $Therapeutic Activity: 8-22 mins                     Vira Blanco, PT, DPT 11:45 AM,04/12/20

## 2020-04-13 DIAGNOSIS — S72001D Fracture of unspecified part of neck of right femur, subsequent encounter for closed fracture with routine healing: Secondary | ICD-10-CM | POA: Diagnosis not present

## 2020-04-13 LAB — BASIC METABOLIC PANEL
Anion gap: 9 (ref 5–15)
BUN: 19 mg/dL (ref 8–23)
CO2: 26 mmol/L (ref 22–32)
Calcium: 8.5 mg/dL — ABNORMAL LOW (ref 8.9–10.3)
Chloride: 103 mmol/L (ref 98–111)
Creatinine, Ser: 0.94 mg/dL (ref 0.44–1.00)
GFR, Estimated: 58 mL/min — ABNORMAL LOW (ref >60)
Glucose, Bld: 91 mg/dL (ref 70–99)
Potassium: 3.6 mmol/L (ref 3.5–5.1)
Sodium: 138 mmol/L (ref 135–145)

## 2020-04-13 LAB — CBC WITH DIFFERENTIAL/PLATELET
Abs Immature Granulocytes: 0.07 10*3/uL (ref 0.00–0.07)
Basophils Absolute: 0.1 10*3/uL (ref 0.0–0.1)
Basophils Relative: 1 %
Eosinophils Absolute: 0.4 10*3/uL (ref 0.0–0.5)
Eosinophils Relative: 3 %
HCT: 37.4 % (ref 36.0–46.0)
Hemoglobin: 12.4 g/dL (ref 12.0–15.0)
Immature Granulocytes: 1 %
Lymphocytes Relative: 10 %
Lymphs Abs: 1.2 10*3/uL (ref 0.7–4.0)
MCH: 30.9 pg (ref 26.0–34.0)
MCHC: 33.2 g/dL (ref 30.0–36.0)
MCV: 93.3 fL (ref 80.0–100.0)
Monocytes Absolute: 1.2 10*3/uL — ABNORMAL HIGH (ref 0.1–1.0)
Monocytes Relative: 10 %
Neutro Abs: 8.6 10*3/uL — ABNORMAL HIGH (ref 1.7–7.7)
Neutrophils Relative %: 75 %
Platelets: 288 10*3/uL (ref 150–400)
RBC: 4.01 MIL/uL (ref 3.87–5.11)
RDW: 14.1 % (ref 11.5–15.5)
WBC: 11.4 10*3/uL — ABNORMAL HIGH (ref 4.0–10.5)
nRBC: 0 % (ref 0.0–0.2)

## 2020-04-13 IMAGING — DX DG HIP (WITH OR WITHOUT PELVIS) 1V PORT*L*
2 series · 2 of 2 positions shown · non-contrast
Comparison: Radiographs 03/02/2018

CLINICAL DATA: Status post left hip prosthesis.  Hip fracture.

EXAM:
DG HIP (WITH OR WITHOUT PELVIS) 1V PORT LEFT

[pelvis ap]
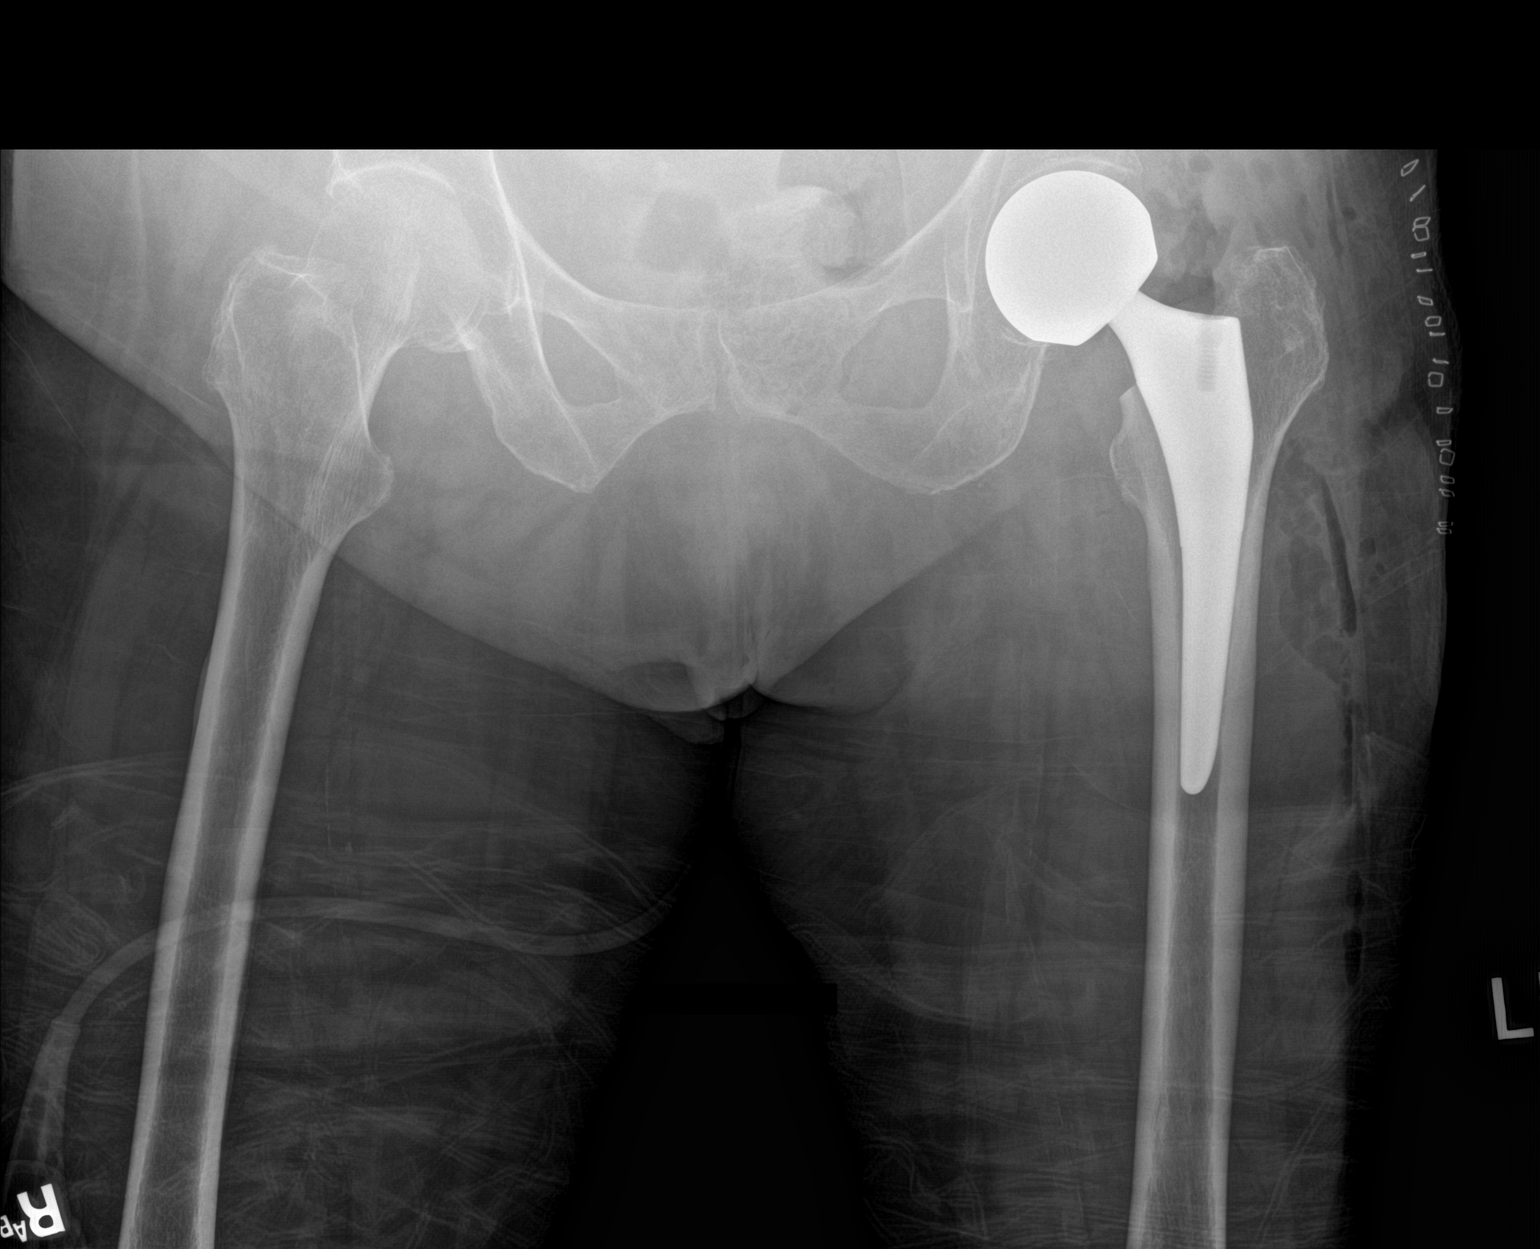

[hip lat]
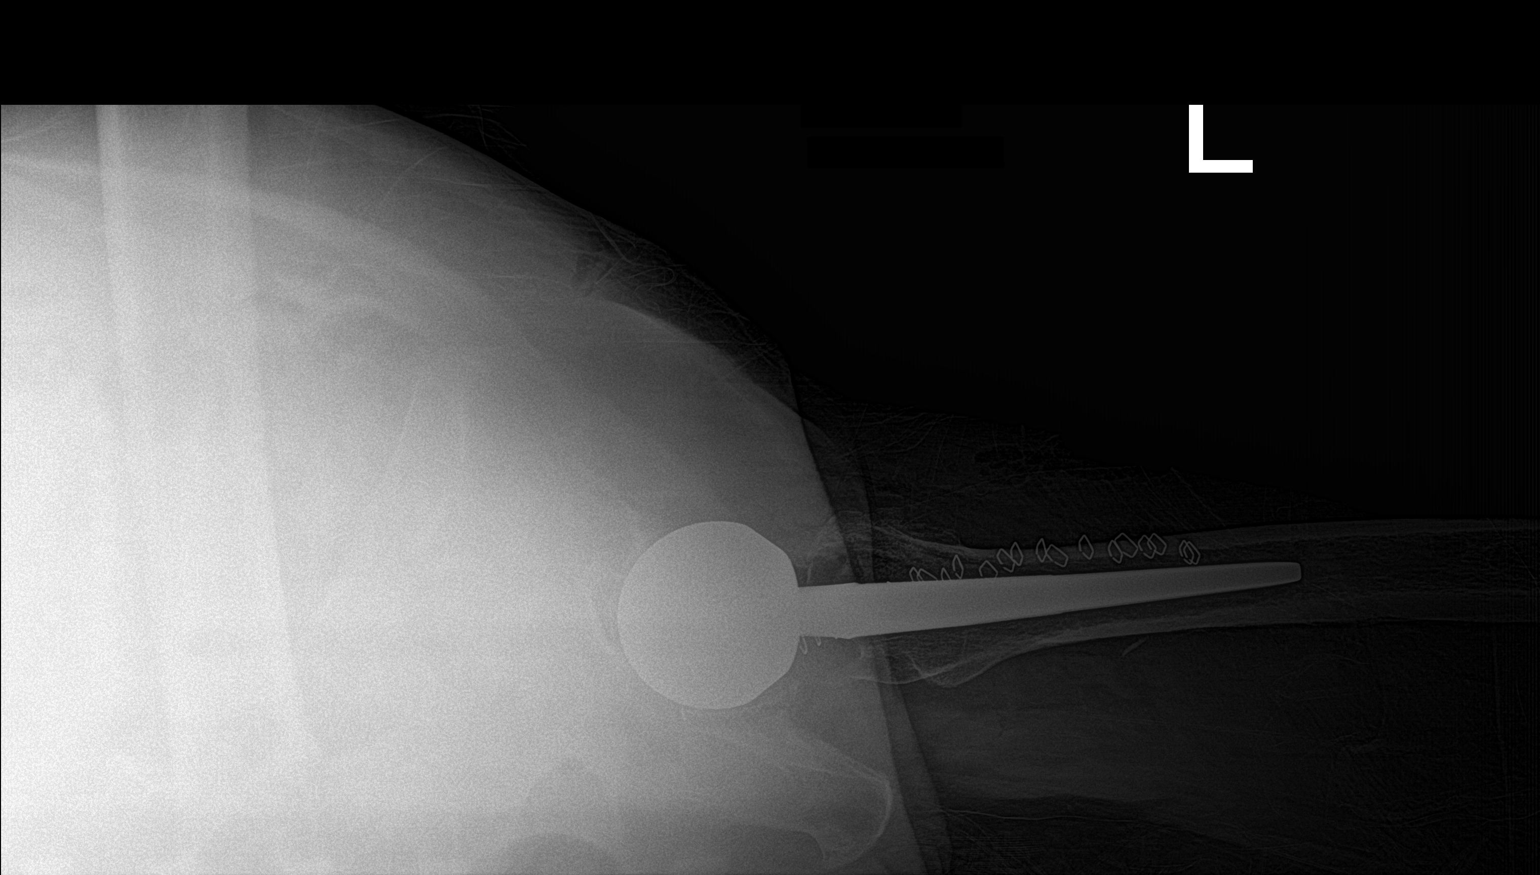

[2 of 2 positions shown; findings below may reference images not displayed]

FINDINGS: There is a bipolar hip prosthesis which appears well seated without
complicating features. The right hip is normally located. The
visualized bony pelvis is intact.
IMPRESSION: Bipolar left hip prosthesis in good position without complicating
features.

## 2020-04-13 MED ORDER — ASPIRIN 81 MG PO TBEC: 81.0000 mg | DELAYED_RELEASE_TABLET | Freq: Two times a day (BID) | ORAL | 0 refills | Status: DC

## 2020-04-13 MED ORDER — POLYETHYLENE GLYCOL 3350 17 G PO PACK: 17.0000 g | PACK | Freq: Every day | ORAL | Status: DC | PRN

## 2020-04-13 MED ORDER — LACTATED RINGERS IV BOLUS
500.0000 mL | Freq: Once | INTRAVENOUS | Status: AC
  Administered 2020-04-13: 500 mL via INTRAVENOUS

## 2020-04-13 MED ORDER — HYDROCODONE-ACETAMINOPHEN 5-325 MG PO TABS
1.0000 | ORAL_TABLET | Freq: Three times a day (TID) | ORAL | 0 refills | Status: DC | PRN
Start: 2020-04-13 — End: 2022-10-15

## 2020-04-13 MED ORDER — AMLODIPINE BESY-BENAZEPRIL HCL 10-20 MG PO CAPS: 1.0000 | ORAL_CAPSULE | Freq: Every day | ORAL | 0 refills | Status: DC

## 2020-04-13 NOTE — TOC Progression Note (Signed)
Transition of Care [facility]) - Progression Note    Patient Details  Name: Diane Rogers MRN: [id number] Date of Birth: Jun 08, 1928  Transition of [facility]) CM/SW Contact  Maud Deed, LCSW Phone Number: 04/13/2020, 3:56 PM  Clinical Narrative:    CSW ordered wheelchair through [employer] to be delivered to pt's home and requested that MD place DME orders.    Expected Discharge Plan: Home w Home Health Services Barriers to Discharge: Continued Medical Work up  Expected Discharge Plan and Services Expected Discharge Plan: Home w Home Health Services       Living arrangements for the past 2 months: Single Family Home Expected Discharge Date: 04/13/20                         HH Arranged: PT,OT,RN,Nurse's Aide HH Agency: [facility] Date HH Agency Contacted: 04/10/20   Representative spoke with at [facility] Agency: Mellissa Kohut   Social Determinants of Health (SDOH) Interventions    Readmission Risk Interventions No flowsheet data found.

## 2020-04-13 NOTE — Discharge Summary (Signed)
Physician Discharge Summary  Diane Rogers KGY:[id number] DOB: 02-05-1929 DOA: 04/07/2020  PCP: [facility]  Admit date: 04/07/2020 Discharge date: 04/13/2020  Discharge disposition: Home with home health therapy   Recommendations for Outpatient Follow-Up:   Follow-up with Dr. Odis Luster, orthopedic surgeon in 2 weeks. Follow-up with PCP in 1 to 2 weeks   Discharge Diagnosis:   Principal Problem:   Closed right hip fracture (HCC) Active Problems:   Essential hypertension, benign   Lower extremity edema   Dyslipidemia   Anemia of chronic disease   Vitamin D deficiency   Peripheral neuropathy    Discharge Condition: Stable.  Diet recommendation:  Diet Order            Diet - low sodium heart healthy           Diet regular Room service appropriate? Yes; Fluid consistency: Thin  Diet effective now                   Code Status: Full Code     Hospital Course:   [name] is a 87 y.o. female with medical history significant ofhypertension, hyperlipidemia, depression, impaired glucose tolerance, who presented to the ED with complaints of progressively worsening right hip pain. She said she sustained a fall on 03/29/2020.  She presented to the ED on 04/07/2020.    She was found to have acute right femoral neck fracture.  She was treated with analgesics.  She was evaluated by orthopedic surgeon, Dr. Odis Luster.  She underwent right anterior hip hemiarthroplasty on 04/09/2020.  She was given IV fluids for dehydration.  Her urinalysis was abnormal concerning for UTI.  Initially, she was treated with IV Rocephin.  Urine culture showed multiple species.  UTI was ruled out so IV Rocephin was discontinued.  Hospital course course was complicated by acute confusional state that was attributed to delirium.  This has resolved.  She also developed acute blood loss anemia.  However, there was no indication for blood transfusion.  She was evaluated by PT  and OT who recommended discharge to SNF.  However, patient and her family refused to go to SNF.  They understand that she is at high risk for fall.  Her condition has improved and she is deemed stable for discharge to home. Discharge plan was discussed with her daughter, [name].     Discharge Exam:    Vitals:   04/12/20 2138 04/12/20 2358 04/13/20 0537 04/13/20 0825  BP: (!) 146/76 (!) 153/69 (!) 179/72 (!) 155/76  Pulse: 86 81 82 88  Resp: 16 16 16 16   Temp: 97.8 F (36.6 C) 98 F (36.7 C) 98.3 F (36.8 C) 98.5 F (36.9 C)  TempSrc:      SpO2: 98% 97% 96% 92%  Weight:      Height:         GEN: NAD SKIN: Warm and dry EYES: No pallor or icterus ENT: MMM CV: RRR PULM: CTA B ABD: soft, ND, NT, +BS CNS: AAO x 3, non focal EXT: Mild right hip tenderness   The results of significant diagnostics from this hospitalization (including imaging, microbiology, ancillary and laboratory) are listed below for reference.     Procedures and Diagnostic Studies:   DG Chest 1 View  Result Date: 04/07/2020 CLINICAL DATA:  04/09/2020 2 days ago EXAM: CHEST  1 VIEW COMPARISON:  03/02/2018 FINDINGS: Mild cardiomegaly with aortic atherosclerosis. No focal opacity or pleural effusion. No pneumothorax. IMPRESSION: No active  disease. Mild cardiomegaly. Electronically Signed   By: Jasmine Pang M.D.   On: 04/07/2020 15:11   DG Pelvis 1-2 Views  Result Date: 04/07/2020 CLINICAL DATA:  Fall with hip pain EXAM: PELVIS - 1-2 VIEW COMPARISON:  03/03/2018 FINDINGS: Previous left hip replacement with normal alignment. Pubic symphysis and rami appear intact. Acute mildly displaced right femoral neck fracture. IMPRESSION: Acute mildly displaced right femoral neck fracture. Electronically Signed   By: Jasmine Pang M.D.   On: 04/07/2020 15:09   DG Femur Min 2 Views Right  Result Date: 04/07/2020 CLINICAL DATA:  Right-sided hip pain after fall 2 days ago EXAM: RIGHT FEMUR 2 VIEWS COMPARISON:   03/03/2018 FINDINGS: Acute displaced right femoral neck fracture. Femoral head projects in joint. The remainder of the right femur is intact. There are vascular calcifications. IMPRESSION: Acute displaced right femoral neck fracture. Electronically Signed   By: Jasmine Pang M.D.   On: 04/07/2020 15:09     Labs:   Basic Metabolic Panel: Recent Labs  Lab 04/07/20 1629 04/08/20 0514 04/13/20 0428  NA 136 140 138  K 4.1 4.1 3.6  CL 102 105 103  CO2 25 28 26   GLUCOSE 105* 103* 91  BUN 21 23 19   CREATININE 1.05* 0.96 0.94  CALCIUM 8.9 8.6* 8.5*   GFR Estimated Creatinine Clearance: 35.5 mL/min (by C-G formula based on SCr of 0.94 mg/dL). Liver Function Tests: No results for input(s): AST, ALT, ALKPHOS, BILITOT, PROT, ALBUMIN in the last 168 hours. No results for input(s): LIPASE, AMYLASE in the last 168 hours. No results for input(s): AMMONIA in the last 168 hours. Coagulation profile Recent Labs  Lab 04/07/20 1629  INR 1.1    CBC: Recent Labs  Lab 04/07/20 1629 04/08/20 0514 04/09/20 0429 04/10/20 0618 04/11/20 0430 04/13/20 0428  WBC 15.0* 12.2* 12.3* 13.2* 12.5* 11.4*  NEUTROABS 12.4*  --   --  11.0* 10.1* 8.6*  HGB 14.8 13.7 13.8 12.3 11.8* 12.4  HCT 43.4 41.7 43.1 36.9 36.8 37.4  MCV 92.3 93.1 93.3 92.7 93.6 93.3  PLT 254 231 250 199 195 288   Cardiac Enzymes: No results for input(s): CKTOTAL, CKMB, CKMBINDEX, TROPONINI in the last 168 hours. BNP: Invalid input(s): POCBNP CBG: No results for input(s): GLUCAP in the last 168 hours. D-Dimer No results for input(s): DDIMER in the last 72 hours. Hgb A1c No results for input(s): HGBA1C in the last 72 hours. Lipid Profile No results for input(s): CHOL, HDL, LDLCALC, TRIG, CHOLHDL, LDLDIRECT in the last 72 hours. Thyroid function studies No results for input(s): TSH, T4TOTAL, T3FREE, THYROIDAB in the last 72 hours.  Invalid input(s): FREET3 Anemia work up Recent Labs    04/12/20 1650  VITAMINB12 140*    Microbiology Recent Results (from the past 240 hour(s))  Resp Panel by RT-PCR (Flu A&B, Covid) Nasopharyngeal Swab     Status: None   Collection Time: 04/07/20  5:16 PM   Specimen: Nasopharyngeal Swab; Nasopharyngeal(NP) swabs in vial transport medium  Result Value Ref Range Status   SARS Coronavirus 2 by RT PCR NEGATIVE NEGATIVE Final    Comment: (NOTE) SARS-CoV-2 target nucleic acids are NOT DETECTED.  The SARS-CoV-2 RNA is generally detectable in upper respiratory specimens during the acute phase of infection. The lowest concentration of SARS-CoV-2 viral copies this assay can detect is 138 copies/mL. A negative result does not preclude SARS-Cov-2 infection and should not be used as the sole basis for treatment or other patient management decisions. A negative result may  occur with  improper specimen collection/handling, submission of specimen other than nasopharyngeal swab, presence of viral mutation(s) within the areas targeted by this assay, and inadequate number of viral copies(<138 copies/mL). A negative result must be combined with clinical observations, patient history, and epidemiological information. The expected result is Negative.  Fact Sheet for Patients:  BloggerCourse.comhttps://www.fda.gov/media/152166/download  Fact Sheet for Healthcare Providers:  SeriousBroker.ithttps://www.fda.gov/media/152162/download  This test is no t yet approved or cleared by the [place] FDA and  has been authorized for detection and/or diagnosis of SARS-CoV-2 by FDA under an Emergency Use Authorization (EUA). This EUA will remain  in effect (meaning this test can be used) for the duration of the COVID-19 declaration under Section 564(b)(1) of the Act, 21 U.S.C.section 360bbb-3(b)(1), unless the authorization is terminated  or revoked sooner.       Influenza A by PCR NEGATIVE NEGATIVE Final   Influenza B by PCR NEGATIVE NEGATIVE Final    Comment: (NOTE) The Xpert Xpress SARS-CoV-2/FLU/RSV plus assay is  intended as an aid in the diagnosis of influenza from Nasopharyngeal swab specimens and should not be used as a sole basis for treatment. Nasal washings and aspirates are unacceptable for Xpert Xpress SARS-CoV-2/FLU/RSV testing.  Fact Sheet for Patients: BloggerCourse.comhttps://www.fda.gov/media/152166/download  Fact Sheet for Healthcare Providers: SeriousBroker.ithttps://www.fda.gov/media/152162/download  This test is not yet approved or cleared by the [place] FDA and has been authorized for detection and/or diagnosis of SARS-CoV-2 by FDA under an Emergency Use Authorization (EUA). This EUA will remain in effect (meaning this test can be used) for the duration of the COVID-19 declaration under Section 564(b)(1) of the Act, 21 U.S.C. section 360bbb-3(b)(1), unless the authorization is terminated or revoked.  Performed at [facility], [address] [place], [place] [address]   Surgical pcr screen     Status: None   Collection Time: 04/08/20  1:33 AM   Specimen: Nasal Mucosa; Nasal Swab  Result Value Ref Range Status   MRSA, PCR NEGATIVE NEGATIVE Final   Staphylococcus aureus NEGATIVE NEGATIVE Final    Comment: (NOTE) The Xpert SA Assay (FDA approved for NASAL specimens in patients 10822 years of age and older), is one component of a comprehensive surveillance program. It is not intended to diagnose infection nor to guide or monitor treatment. Performed at [facility], [address] [place], [place] [address]   Urine Culture     Status: Abnormal   Collection Time: 04/08/20  3:30 AM   Specimen: Urine, Random  Result Value Ref Range Status   Specimen Description   Final    URINE, RANDOM Performed at [facility], [address] [place], [place] [address]    Special Requests   Final    NONE Performed at [facility], [address] [place], [place] [address]    Culture MULTIPLE SPECIES PRESENT, SUGGEST RECOLLECTION (A)  Final   Report Status 04/09/2020  FINAL  Final     Discharge Instructions:   Discharge Instructions    Diet - low sodium heart healthy   Complete by: As directed    Discharge wound care:   Complete by: As directed    Keep wound clean and dry.  Follow-up with Dr. Odis LusterBowers, orthopedic surgeon, in 2 weeks for staple removal   Increase activity slowly   Complete by: As directed      Allergies as of 04/13/2020      Reactions   Penicillins Other (See Comments)   Unknown reaction, was always told she had allergy  Medication List    STOP taking these medications   aspirin 325 MG tablet Replaced by: aspirin 81 MG EC tablet   Dermacloud Crea   NON FORMULARY     TAKE these medications   acetaminophen 325 MG tablet Commonly known as: TYLENOL Take 650 mg by mouth every 6 (six) hours as needed.   amLODipine-benazepril 10-20 MG capsule Commonly known as: LOTREL Take 1 capsule by mouth daily.   aspirin 81 MG EC tablet Take 1 tablet (81 mg total) by mouth 2 (two) times daily. Swallow whole. Replaces: aspirin 325 MG tablet   bisacodyl 5 MG EC tablet Commonly known as: bisacodyl Take 1 tablet (5 mg total) by mouth daily as needed for moderate constipation.   calcium carbonate 500 MG chewable tablet Commonly known as: TUMS - dosed in mg elemental calcium Chew 1 tablet by mouth 3 (three) times daily.   HYDROcodone-acetaminophen 5-325 MG tablet Commonly known as: NORCO/VICODIN Take 1 tablet by mouth every 8 (eight) hours as needed for moderate pain.   polyethylene glycol 17 g packet Commonly known as: MIRALAX / GLYCOLAX Take 17 g by mouth daily as needed for mild constipation.            Discharge Care Instructions  (From admission, onward)         Start     Ordered   04/13/20 0000  Discharge wound care:       Comments: Keep wound clean and dry.  Follow-up with Dr. Odis Luster, orthopedic surgeon, in 2 weeks for staple removal   04/13/20 1030          Follow-up Information    Lyndle Herrlich, MD  Follow up in 2 week(s).   Specialty: Orthopedic Surgery Contact information: [address] [place] [phone]                Time coordinating discharge: 33 minutes  Signed:  Lurene Shadow  [facility] 04/13/2020, 10:30 AM   Pager on [email or web address]. If 7PM-7AM, please contact Rogers-coverage at [email or web address]

## 2020-04-13 NOTE — Progress Notes (Addendum)
Physical Therapy Treatment Patient Details Name: Diane Rogers MRN: [id number] DOB: 09-Aug-1928 Today's Date: 04/13/2020    History of Present Illness Pt is an 87 y/o F with PMH that includes HTN, HLD, depression and fall with L hemiarthroplasty in Jan 2020. Pt now presents after sustaining mechanical fall with displaced right femoral neck fracture and is s/p right hip anterior hip hemiarthroplasty.    PT Comments    Split session.  10:50-11:28 12:00 -12:21  Pt ready for session.  Participated in exercises as described below.  To EOB with min a x 1 and good effort by pt.  She is steady in sitting once up.  Stood with RW and min a x 1 and is able to march in place and SLR RLE x 10.  After seated rest, transfers to recliner at bedside with min/mod a x 1.    After session, discharge orders noted to be in.  Returned for education with daughter regarding transfers and wheelchair use.  Given gait belt.  Daughter very receptive to education and voices understanding.  Messaged team in regards to DC needs.  SNF origainally recommended for safety and level of assist but daughter declined.  Given performance today, Home with assist and HHPT is reasonable.  She does have a split level home and at this point she is non-ambulatory besides transfers and is unable to climb stairs.  She will need EMS transport home and pt and daughter are aware.  She does not have and will need a wheelchair for in home mobility.  Daughter has been educated on return session in regards to wheelchair safety and use/transfers etc.   Patient suffers from hip fracture which impairs his/her ability to perform daily activities like toileting, feeding, dressing, grooming, bathing in the home. A cane, walker, crutch will not resolve the patient's issue with performing activities of daily living. A lightweight wheelchair and cushion is required/recommended and will allow patient to safely perform daily activities.   Patient can  safely propel the wheelchair in the home or has a caregiver who can provide assistance.  Discussed with team and MD who agrees to hold DC for today while equipment and transport needs are addressed.  Will see pt in AM to continue education with daughter. Daughter stated she will be ready for discharge home tomorrow.       Follow Up Recommendations  Home health PT;Supervision/Assistance - 24 hour     Equipment Recommendations  Wheelchair (measurements PT);Wheelchair cushion (measurements PT)    Recommendations for Other Services       Precautions / Restrictions Precautions Precautions: Fall;Anterior Hip Precaution Booklet Issued: Yes (comment) Restrictions Weight Bearing Restrictions: Yes RLE Weight Bearing: Weight bearing as tolerated    Mobility  Bed Mobility Overal bed mobility: Needs Assistance Bed Mobility: Supine to Sit Rolling: Min assist         General bed mobility comments: Left side as at home    Transfers Overall transfer level: Needs assistance Equipment used: Rolling walker (2 wheeled) Transfers: Sit to/from Stand Sit to Stand: Min assist         General transfer comment: significant improvement today  Ambulation/Gait Ambulation/Gait assistance: Min assist;Mod assist Gait Distance (Feet): 3 Feet Assistive device: Rolling walker (2 wheeled) Gait Pattern/deviations: Step-to pattern Gait velocity: decreased   General Gait Details: able to transfer to recliner with cues and min/mod a  x 1   Optometrist  Modified Rankin (Stroke Patients Only)       Balance Overall balance assessment: Needs assistance Sitting-balance support: Bilateral upper extremity supported;Feet supported Sitting balance-[name]: Fair     Standing balance support: Bilateral upper extremity supported Standing balance-[name]: Poor Standing balance comment: close min/mod assist but generally improved today                             Cognition Arousal/Alertness: Awake/alert Behavior During Therapy: WFL for tasks assessed/performed Overall Cognitive Status: Within Functional Limits for tasks assessed                                        Exercises Total Joint Exercises Ankle Circles/Pumps: AROM;Strengthening;Both;10 reps Quad Sets: Strengthening;Both;10 reps Gluteal Sets: Strengthening;Both;10 reps Towel Squeeze: Strengthening;Both;10 reps Heel Slides: AROM;Strengthening;Both;10 reps Hip ABduction/ADduction: Strengthening;Both;10 reps;AROM Straight Leg Raises: AROM;Strengthening;Both;10 reps (AAROM RLE) Long Arc Quad: AROM;Strengthening;Both;10 reps Knee Flexion: AROM;Strengthening;Both;10 reps;5 reps Other Exercises Other Exercises: extensive education regarding mobility, equipment and safety at home.    General Comments        Pertinent Vitals/Pain Pain Assessment: Faces Faces Pain Scale: Hurts a little bit Pain Location: R hip Pain Descriptors / Indicators: Sore Pain Intervention(s): Limited activity within patient's tolerance;Monitored during session;Repositioned;Premedicated before session    Home Living                      Prior Function            PT Goals (current goals can now be found in the care plan section) Progress towards PT goals: Progressing toward goals    Frequency    BID      PT Plan Discharge plan needs to be updated    Co-evaluation              AM-PAC PT "6 Clicks" Mobility   Outcome Measure  Help needed turning from your back to your side while in a flat bed without using bedrails?: A Lot Help needed moving from lying on your back to sitting on the side of a flat bed without using bedrails?: A Lot Help needed moving to and from a bed to a chair (including a wheelchair)?: A Lot Help needed standing up from a chair using your arms (e.g., wheelchair or bedside chair)?: A Lot Help needed to walk in hospital room?:  A Lot Help needed climbing 3-5 steps with a railing? : Total 6 Click Score: 11    End of Session Equipment Utilized During Treatment: Gait belt Activity Tolerance: Patient tolerated treatment well;Patient limited by fatigue Patient left: in chair;with call bell/phone within reach;with chair alarm set;with family/visitor present Nurse Communication: Mobility status;Weight bearing status PT Visit Diagnosis: Unsteadiness on feet (R26.81);History of falling (Z91.81);Muscle weakness (generalized) (M62.81);Difficulty in walking, not elsewhere classified (R26.2) Pain - Right/Left: Right Pain - part of body: Hip     Time: 1132-1221 PT Time Calculation (min) (ACUTE ONLY): 49 min  Charges:  $Therapeutic Exercise: 8-22 mins $Therapeutic Activity: 23-37 mins                    Danielle Dess, PTA 04/13/20, 12:39 PM

## 2020-04-13 NOTE — Progress Notes (Signed)
  Subjective:  Patient reports pain as mild.  Daughter in room.  Objective:   VITALS:   Vitals:   04/12/20 2358 04/13/20 0537 04/13/20 0825 04/13/20 1153  BP: (!) 153/69 (!) 179/72 (!) 155/76 (!) 163/59  Pulse: 81 82 88 79  Resp: 16 16 16 16   Temp: 98 F (36.7 C) 98.3 F (36.8 C) 98.5 F (36.9 C) 97.9 F (36.6 C)  TempSrc:      SpO2: 97% 96% 92% 95%  Weight:      Height:        PHYSICAL EXAM:  ABD soft Sensation intact distally Dorsiflexion/Plantar flexion intact Incision: dressing C/D/I No cellulitis present Compartment soft  LABS  Results for orders placed or performed during the hospital encounter of 04/07/20 (from the past 24 hour(s))  Vitamin B12     Status: Abnormal   Collection Time: 04/12/20  4:50 PM  Result Value Ref Range   Vitamin B-12 140 (L) 180 - 914 pg/mL  CBC with Differential/Platelet     Status: Abnormal   Collection Time: 04/13/20  4:28 AM  Result Value Ref Range   WBC 11.4 (H) 4.0 - 10.5 K/uL   RBC 4.01 3.87 - 5.11 MIL/uL   Hemoglobin 12.4 12.0 - 15.0 g/dL   HCT 06/13/20 03.5 - 46.5 %   MCV 93.3 80.0 - 100.0 fL   MCH 30.9 26.0 - 34.0 pg   MCHC 33.2 30.0 - 36.0 g/dL   RDW 68.1 27.5 - 17.0 %   Platelets 288 150 - 400 K/uL   nRBC 0.0 0.0 - 0.2 %   Neutrophils Relative % 75 %   Neutro Abs 8.6 (H) 1.7 - 7.7 K/uL   Lymphocytes Relative 10 %   Lymphs Abs 1.2 0.7 - 4.0 K/uL   Monocytes Relative 10 %   Monocytes Absolute 1.2 (H) 0.1 - 1.0 K/uL   Eosinophils Relative 3 %   Eosinophils Absolute 0.4 0.0 - 0.5 K/uL   Basophils Relative 1 %   Basophils Absolute 0.1 0.0 - 0.1 K/uL   Immature Granulocytes 1 %   Abs Immature Granulocytes 0.07 0.00 - 0.07 K/uL  Basic metabolic panel     Status: Abnormal   Collection Time: 04/13/20  4:28 AM  Result Value Ref Range   Sodium 138 135 - 145 mmol/L   Potassium 3.6 3.5 - 5.1 mmol/L   Chloride 103 98 - 111 mmol/L   CO2 26 22 - 32 mmol/L   Glucose, Bld 91 70 - 99 mg/dL   BUN 19 8 - 23 mg/dL   Creatinine,  Ser 06/13/20 0.44 - 1.00 mg/dL   Calcium 8.5 (L) 8.9 - 10.3 mg/dL   GFR, Estimated 58 (L) >60 mL/min   Anion gap 9 5 - 15    No results found.  Assessment/Plan: 4 Days Post-Op   Principal Problem:   Closed right hip fracture (HCC) Active Problems:   Essential hypertension, benign   Lower extremity edema   Dyslipidemia   Anemia of chronic disease   Vitamin D deficiency   Peripheral neuropathy   Up with therapy Discharge home with home health   RTC in 2 weeks for staple removal   4.94 , MD 04/13/2020, 12:41 PM

## 2020-04-13 NOTE — Progress Notes (Signed)
Progress Note    Diane Rogers  ZOX:[id number] DOB: 21-Jun-1928  DOA: 04/07/2020 PCP: [facility]      Brief Narrative:    Medical records reviewed and are as summarized below:  Diane Rogers is a 87 y.o. female with medical history significant of hypertension, hyperlipidemia, depression, impaired glucose tolerance presented to the ED with complaints of progressively worsening right hip pain.  She said she sustained a fall on 03/29/2020.  She presented to the ED on 04/07/2020.  She was found to have acute right femoral neck fracture.   Assessment/Plan:   Principal Problem:   Closed right hip fracture (HCC) Active Problems:   Essential hypertension, benign   Lower extremity edema   Dyslipidemia   Anemia of chronic disease   Vitamin D deficiency   Peripheral neuropathy   Nutrition Problem: Increased nutrient needs Etiology: post-op healing  Signs/Symptoms: estimated needs   Body mass index is 27.79 kg/m.     Closed right hip fracture s/p mechanical fall: S/p right anterior hip hemiarthroplasty on 04/09/2020.  Continue analgesics as needed for pain.  Follow-up with orthopedic surgeon.   Hypertension/Hypertensive urgency: Continue amlodipine  CKD stage IIIa: Creatinine stable.  Abnormal urinalysis: Urine culture showed multiple species.  She has already received 3 doses of IV Rocephin. Discontinue IV Rocephin.    Diet Order            Diet - low sodium heart healthy           Diet regular Room service appropriate? Yes; Fluid consistency: Thin  Diet effective now                    Consultants:  Orthopedic surgeon  Procedures:  Right hip anterior hip hemiarthroplasty on 04/09/2020    Medications:   . amLODipine  5 mg Oral Daily  . aspirin EC  81 mg Oral BID  . benazepril  20 mg Oral Daily  . busPIRone  5 mg Oral TID  . Chlorhexidine Gluconate Cloth  6 each Topical Daily  . docusate sodium  100 mg Oral BID  .  feeding supplement  237 mL Oral BID BM  .  morphine injection  2 mg Intravenous Once  . multivitamin with minerals  1 tablet Oral Daily  . senna  1 tablet Oral BID  . traMADol  50 mg Oral Q6H  . tranexamic acid (CYKLOKAPRON) topical - INTRAOP  2,000 mg Topical Once   Continuous Infusions: . sodium chloride 10 mL/hr at 04/10/20 0554  . lactated ringers    . lactated ringers Stopped (04/11/20 8119)     Anti-infectives (From admission, onward)   Start     Dose/Rate Route Frequency Ordered Stop   04/08/20 1000  cefTRIAXone (ROCEPHIN) 1 g in sodium chloride 0.9 % 100 mL IVPB  Status:  Discontinued        1 g 200 mL/hr over 30 Minutes Intravenous Every 24 hours 04/08/20 0853 04/10/20 1135             Family Communication/Anticipated D/C date and plan/Code Status   DVT prophylaxis: SCDs Start: 04/09/20 2002 SCDs Start: 04/07/20 1727     Code Status: Full Code  Family Communication: Discussed with her daughter, Bonita Quin, at the bedside Disposition Plan:    Status is: Inpatient  Remains inpatient appropriate because:Unsafe d/c plan   Dispo: The patient is from: Home              Anticipated  d/c is to: Home              Patient currently is not medically stable to d/c.   Difficult to place patient No           Subjective:   C/o hip pain.  Her daughter, Bonita Quin, is at the bedside.  Her nurse was also at the bedside.  Objective:    Vitals:   04/12/20 2358 04/13/20 0537 04/13/20 0825 04/13/20 1153  BP: (!) 153/69 (!) 179/72 (!) 155/76 (!) 163/59  Pulse: 81 82 88 79  Resp: 16 16 16 16   Temp: 98 F (36.7 C) 98.3 F (36.8 C) 98.5 F (36.9 C) 97.9 F (36.6 C)  TempSrc:      SpO2: 97% 96% 92% 95%  Weight:      Height:       No data found.   Intake/Output Summary (Last 24 hours) at 04/13/2020 1230 Last data filed at 04/12/2020 1700 Gross per 24 hour  Intake -  Output 1 ml  Net -1 ml   Filed Weights   04/07/20 1402 04/07/20 2052 04/09/20 1408  Weight:  64 kg 66.7 kg 66.7 kg    Exam:  GEN: NAD SKIN: Warm and dry EYES: No pallor or icterus ENT: MMM CV: RRR PULM: CTA B ABD: soft, ND, NT, +BS CNS: AAO x 3, non focal EXT: Dressing on right hip surgical wound is clean, dry and intact.       Data Reviewed:   I have personally reviewed following labs and imaging studies:  Labs: Labs show the following:   Basic Metabolic Panel: Recent Labs  Lab 04/07/20 1629 04/08/20 0514 04/13/20 0428  NA 136 140 138  K 4.1 4.1 3.6  CL 102 105 103  CO2 25 28 26   GLUCOSE 105* 103* 91  BUN 21 23 19   CREATININE 1.05* 0.96 0.94  CALCIUM 8.9 8.6* 8.5*   GFR Estimated Creatinine Clearance: 35.5 mL/min (by C-G formula based on SCr of 0.94 mg/dL). Liver Function Tests: No results for input(s): AST, ALT, ALKPHOS, BILITOT, PROT, ALBUMIN in the last 168 hours. No results for input(s): LIPASE, AMYLASE in the last 168 hours. No results for input(s): AMMONIA in the last 168 hours. Coagulation profile Recent Labs  Lab 04/07/20 1629  INR 1.1    CBC: Recent Labs  Lab 04/07/20 1629 04/08/20 0514 04/09/20 0429 04/10/20 0618 04/11/20 0430 04/13/20 0428  WBC 15.0* 12.2* 12.3* 13.2* 12.5* 11.4*  NEUTROABS 12.4*  --   --  11.0* 10.1* 8.6*  HGB 14.8 13.7 13.8 12.3 11.8* 12.4  HCT 43.4 41.7 43.1 36.9 36.8 37.4  MCV 92.3 93.1 93.3 92.7 93.6 93.3  PLT 254 231 250 199 195 288   Cardiac Enzymes: No results for input(s): CKTOTAL, CKMB, CKMBINDEX, TROPONINI in the last 168 hours. BNP (last 3 results) No results for input(s): PROBNP in the last 8760 hours. CBG: No results for input(s): GLUCAP in the last 168 hours. D-Dimer: No results for input(s): DDIMER in the last 72 hours. Hgb A1c: No results for input(s): HGBA1C in the last 72 hours. Lipid Profile: No results for input(s): CHOL, HDL, LDLCALC, TRIG, CHOLHDL, LDLDIRECT in the last 72 hours. Thyroid function studies: No results for input(s): TSH, T4TOTAL, T3FREE, THYROIDAB in the last  72 hours.  Invalid input(s): FREET3 Anemia work up: Recent Labs    04/12/20 1650  VITAMINB12 140*   Sepsis Labs: Recent Labs  Lab 04/09/20 0429 04/10/20 0618 04/11/20 0430 04/13/20 0428  WBC  12.3* 13.2* 12.5* 11.4*    Microbiology Recent Results (from the past 240 hour(s))  Resp Panel by RT-PCR (Flu A&B, Covid) Nasopharyngeal Swab     Status: None   Collection Time: 04/07/20  5:16 PM   Specimen: Nasopharyngeal Swab; Nasopharyngeal(NP) swabs in vial transport medium  Result Value Ref Range Status   SARS Coronavirus 2 by RT PCR NEGATIVE NEGATIVE Final    Comment: (NOTE) SARS-CoV-2 target nucleic acids are NOT DETECTED.  The SARS-CoV-2 RNA is generally detectable in upper respiratory specimens during the acute phase of infection. The lowest concentration of SARS-CoV-2 viral copies this assay can detect is 138 copies/mL. A negative result does not preclude SARS-Cov-2 infection and should not be used as the sole basis for treatment or other patient management decisions. A negative result may occur with  improper specimen collection/handling, submission of specimen other than nasopharyngeal swab, presence of viral mutation(s) within the areas targeted by this assay, and inadequate number of viral copies(<138 copies/mL). A negative result must be combined with clinical observations, patient history, and epidemiological information. The expected result is Negative.  Fact Sheet for Patients:  BloggerCourse.com  Fact Sheet for Healthcare Providers:  SeriousBroker.it  This test is no t yet approved or cleared by the [place] FDA and  has been authorized for detection and/or diagnosis of SARS-CoV-2 by FDA under an Emergency Use Authorization (EUA). This EUA will remain  in effect (meaning this test can be used) for the duration of the COVID-19 declaration under Section 564(b)(1) of the Act, 21 U.S.C.section 360bbb-3(b)(1),  unless the authorization is terminated  or revoked sooner.       Influenza A by PCR NEGATIVE NEGATIVE Final   Influenza B by PCR NEGATIVE NEGATIVE Final    Comment: (NOTE) The Xpert Xpress SARS-CoV-2/FLU/RSV plus assay is intended as an aid in the diagnosis of influenza from Nasopharyngeal swab specimens and should not be used as a sole basis for treatment. Nasal washings and aspirates are unacceptable for Xpert Xpress SARS-CoV-2/FLU/RSV testing.  Fact Sheet for Patients: BloggerCourse.com  Fact Sheet for Healthcare Providers: SeriousBroker.it  This test is not yet approved or cleared by the [place] FDA and has been authorized for detection and/or diagnosis of SARS-CoV-2 by FDA under an Emergency Use Authorization (EUA). This EUA will remain in effect (meaning this test can be used) for the duration of the COVID-19 declaration under Section 564(b)(1) of the Act, 21 U.S.C. section 360bbb-3(b)(1), unless the authorization is terminated or revoked.  Performed at [facility], [address], [place], [place] [address]   Surgical pcr screen     Status: None   Collection Time: 04/08/20  1:33 AM   Specimen: Nasal Mucosa; Nasal Swab  Result Value Ref Range Status   MRSA, PCR NEGATIVE NEGATIVE Final   Staphylococcus aureus NEGATIVE NEGATIVE Final    Comment: (NOTE) The Xpert SA Assay (FDA approved for NASAL specimens in patients 38 years of age and older), is one component of a comprehensive surveillance program. It is not intended to diagnose infection nor to guide or monitor treatment. Performed at [facility], [address] [place], [place] [address]   Urine Culture     Status: Abnormal   Collection Time: 04/08/20  3:30 AM   Specimen: Urine, Random  Result Value Ref Range Status   Specimen Description   Final    URINE, RANDOM Performed at [facility], [address]  [place], [place] [address]    Special Requests  Final    NONE Performed at [facility], [address] [place], [place] [address]    Culture MULTIPLE SPECIES PRESENT, SUGGEST RECOLLECTION (A)  Final   Report Status 04/09/2020 FINAL  Final    Procedures and diagnostic studies:  No results found.             LOS: 6 days   BERNARD AYIKU  [facility]   Pager on [email or web address]. If 7PM-7AM, please contact night-coverage at [email or web address]     04/13/2020, 12:30 PM               Progress Note    KARLE DESROSIER  ZJQ:[id number] DOB: Jul 13, 1928  DOA: 04/07/2020 PCP: [facility]      Brief Narrative:    Medical records reviewed and are as summarized below:  Diane Rogers is a 87 y.o. female with medical history significant of hypertension, hyperlipidemia, depression, impaired glucose tolerance presented to the ED with complaints of progressively worsening right hip pain.  She said she sustained a fall on 03/29/2020.  She presented to the ED on 04/07/2020.  She was found to have acute right femoral neck fracture.   Assessment/Plan:   Principal Problem:   Closed right hip fracture (HCC) Active Problems:   Essential hypertension, benign   Lower extremity edema   Dyslipidemia   Anemia of chronic disease   Vitamin D deficiency   Peripheral neuropathy   Nutrition Problem: Increased nutrient needs Etiology: post-op healing  Signs/Symptoms: estimated needs   Body mass index is 27.79 kg/m.     Closed right hip fracture s/p mechanical fall: S/p right hip anterior hip hemiarthroplasty on 04/09/2020.  Continue analgesics as needed for pain. According to the social worker, home health therapy services will not be available until Monday, 04/14/2020.  Patient was discharged earlier today but discharge was canceled because of concerns from PT.  Sarah, physical therapist,  said patient is unable to walk and will need a  wheelchair at home.  Unfortunately patient does not have a wheelchair at home.  Sarah requested that we wait till tomorrow so she can finish education with daughter for safe transfers and wheelchair use.  Delirium/acute confusional state: Improved.  Hypertension/Hypertensive urgency: Continue antihypertensives.  Monitor BP closely.  Duration: Patient was given IV fluids today.  CKD stage IIIa: Creatinine stable.  Abnormal urinalysis: Urine culture showed multiple species.  UTI is unlikely.  Completed 3 doses of IV Rocephin.  Plan to discharge home tomorrow.   Diet Order            Diet - low sodium heart healthy           Diet regular Room service appropriate? Yes; Fluid consistency: Thin  Diet effective now                    Consultants:  Orthopedic surgeon  Procedures:  Right hip anterior hip hemiarthroplasty on 04/09/2020    Medications:   . amLODipine  5 mg Oral Daily  . aspirin EC  81 mg Oral BID  . benazepril  20 mg Oral Daily  . busPIRone  5 mg Oral TID  . Chlorhexidine Gluconate Cloth  6 each Topical Daily  . docusate sodium  100 mg Oral BID  . feeding supplement  237 mL Oral BID BM  .  morphine injection  2 mg Intravenous Once  . multivitamin with minerals  1 tablet Oral Daily  . senna  1 tablet Oral BID  .  traMADol  50 mg Oral Q6H  . tranexamic acid (CYKLOKAPRON) topical - INTRAOP  2,000 mg Topical Once   Continuous Infusions: . sodium chloride 10 mL/hr at 04/10/20 0554  . lactated ringers    . lactated ringers Stopped (04/11/20 6578)     Anti-infectives (From admission, onward)   Start     Dose/Rate Route Frequency Ordered Stop   04/08/20 1000  cefTRIAXone (ROCEPHIN) 1 g in sodium chloride 0.9 % 100 mL IVPB  Status:  Discontinued        1 g 200 mL/hr over 30 Minutes Intravenous Every 24 hours 04/08/20 0853 04/10/20 1135             Family Communication/Anticipated D/C date and plan/Code Status   DVT prophylaxis: SCDs Start:  04/09/20 2002 SCDs Start: 04/07/20 1727     Code Status: Full Code  Family Communication: Discussed with Diane Rogers, her daughter Disposition Plan:    Status is: Inpatient  Remains inpatient appropriate because:Unsafe d/c plan   Dispo: The patient is from: Home              Anticipated d/c is to: Home              Patient currently is not medically stable to d/c.   Difficult to place patient No           Subjective:   Interval events noted.  She is feeling much better today.  Objective:    Vitals:   04/12/20 2358 04/13/20 0537 04/13/20 0825 04/13/20 1153  BP: (!) 153/69 (!) 179/72 (!) 155/76 (!) 163/59  Pulse: 81 82 88 79  Resp: Temp: 98 F (36.7 C) 98.3 F (36.8 C) 98.5 F (36.9 C) 97.9 F (36.6 C)  TempSrc:      SpO2: 97% 96% 92% 95%  Weight:      Height:       No data found.   Intake/Output Summary (Last 24 hours) at 04/13/2020 1230 Last data filed at 04/12/2020 1700 Gross per 24 hour  Intake -  Output 1 ml  Net -1 ml   Filed Weights   04/07/20 1402 04/07/20 2052 04/09/20 1408  Weight: 64 kg 66.7 kg 66.7 kg    Exam:   GEN: NAD SKIN: Warm and dry EYES: No pallor or icterus ENT: MMM CV: RRR PULM: CTA B ABD: soft, ND, NT, +BS CNS: AAO x 3, non focal EXT: Dressing on right hip surgical wound is clean, dry and intact        Data Reviewed:   I have personally reviewed following labs and imaging studies:  Labs: Labs show the following:   Basic Metabolic Panel: Recent Labs  Lab 04/07/20 1629 04/08/20 0514 04/13/20 0428  NA 136 140 138  K 4.1 4.1 3.6  CL 102 105 103  CO2 GLUCOSE 105* 103* 91  BUN CREATININE 1.05* 0.96 0.94  CALCIUM 8.9 8.6* 8.5*   GFR Estimated Creatinine Clearance: 35.5 mL/min (by C-G formula based on SCr of 0.94 mg/dL). Liver Function Tests: No results for input(s): AST, ALT, ALKPHOS, BILITOT, PROT, ALBUMIN in the last 168 hours. No results for input(s): LIPASE, AMYLASE  in the last 168 hours. No results for input(s): AMMONIA in the last 168 hours. Coagulation profile Recent Labs  Lab 04/07/20 1629  INR 1.1    CBC: Recent Labs  Lab 04/07/20 1629 04/08/20 4696 04/09/20 0429 04/10/20 0618 04/11/20 0430 04/13/20 2952  WBC 15.0* 12.2* 12.3* 13.2* 12.5* 11.4*  NEUTROABS 12.4*  --   --  11.0* 10.1* 8.6*  HGB 14.8 13.7 13.8 12.3 11.8* 12.4  HCT 43.4 41.7 43.1 36.9 36.8 37.4  MCV 92.3 93.1 93.3 92.7 93.6 93.3  PLT 254 231 250 199 195 288   Cardiac Enzymes: No results for input(s): CKTOTAL, CKMB, CKMBINDEX, TROPONINI in the last 168 hours. BNP (last 3 results) No results for input(s): PROBNP in the last 8760 hours. CBG: No results for input(s): GLUCAP in the last 168 hours. D-Dimer: No results for input(s): DDIMER in the last 72 hours. Hgb A1c: No results for input(s): HGBA1C in the last 72 hours. Lipid Profile: No results for input(s): CHOL, HDL, LDLCALC, TRIG, CHOLHDL, LDLDIRECT in the last 72 hours. Thyroid function studies: No results for input(s): TSH, T4TOTAL, T3FREE, THYROIDAB in the last 72 hours.  Invalid input(s): FREET3 Anemia work up: Recent Labs    04/12/20 1650  VITAMINB12 140*   Sepsis Labs: Recent Labs  Lab 04/09/20 0429 04/10/20 0618 04/11/20 0430 04/13/20 0428  WBC 12.3* 13.2* 12.5* 11.4*    Microbiology Recent Results (from the past 240 hour(s))  Resp Panel by RT-PCR (Flu A&B, Covid) Nasopharyngeal Swab     Status: None   Collection Time: 04/07/20  5:16 PM   Specimen: Nasopharyngeal Swab; Nasopharyngeal(NP) swabs in vial transport medium  Result Value Ref Range Status   SARS Coronavirus 2 by RT PCR NEGATIVE NEGATIVE Final    Comment: (NOTE) SARS-CoV-2 target nucleic acids are NOT DETECTED.  The SARS-CoV-2 RNA is generally detectable in upper respiratory specimens during the acute phase of infection. The lowest concentration of SARS-CoV-2 viral copies this assay can detect is 138 copies/mL. A negative  result does not preclude SARS-Cov-2 infection and should not be used as the sole basis for treatment or other patient management decisions. A negative result may occur with  improper specimen collection/handling, submission of specimen other than nasopharyngeal swab, presence of viral mutation(s) within the areas targeted by this assay, and inadequate number of viral copies(<138 copies/mL). A negative result must be combined with clinical observations, patient history, and epidemiological information. The expected result is Negative.  Fact Sheet for Patients:  BloggerCourse.com  Fact Sheet for Healthcare Providers:  SeriousBroker.it  This test is no t yet approved or cleared by the [place] FDA and  has been authorized for detection and/or diagnosis of SARS-CoV-2 by FDA under an Emergency Use Authorization (EUA). This EUA will remain  in effect (meaning this test can be used) for the duration of the COVID-19 declaration under Section 564(b)(1) of the Act, 21 U.S.C.section 360bbb-3(b)(1), unless the authorization is terminated  or revoked sooner.       Influenza A by PCR NEGATIVE NEGATIVE Final   Influenza B by PCR NEGATIVE NEGATIVE Final    Comment: (NOTE) The Xpert Xpress SARS-CoV-2/FLU/RSV plus assay is intended as an aid in the diagnosis of influenza from Nasopharyngeal swab specimens and should not be used as a sole basis for treatment. Nasal washings and aspirates are unacceptable for Xpert Xpress SARS-CoV-2/FLU/RSV testing.  Fact Sheet for Patients: BloggerCourse.com  Fact Sheet for Healthcare Providers: SeriousBroker.it  This test is not yet approved or cleared by the [place] FDA and has been authorized for detection and/or diagnosis of SARS-CoV-2 by FDA under an Emergency Use Authorization (EUA). This EUA will remain in effect (meaning this test can be used)  for the duration of the COVID-19 declaration under Section 564(b)(1) of the Act,  21 U.S.C. section 360bbb-3(b)(1), unless the authorization is terminated or revoked.  Performed at [facility], [address] [place], [place] [address]   Surgical pcr screen     Status: None   Collection Time: 04/08/20  1:33 AM   Specimen: Nasal Mucosa; Nasal Swab  Result Value Ref Range Status   MRSA, PCR NEGATIVE NEGATIVE Final   Staphylococcus aureus NEGATIVE NEGATIVE Final    Comment: (NOTE) The Xpert SA Assay (FDA approved for NASAL specimens in patients 85 years of age and older), is one component of a comprehensive surveillance program. It is not intended to diagnose infection nor to guide or monitor treatment. Performed at [facility], [address] [place], [place] [address]   Urine Culture     Status: Abnormal   Collection Time: 04/08/20  3:30 AM   Specimen: Urine, Random  Result Value Ref Range Status   Specimen Description   Final    URINE, RANDOM Performed at [facility], [address] [place], [place] [address]    Special Requests   Final    NONE Performed at [facility], [address] [place], [place] [address]    Culture MULTIPLE SPECIES PRESENT, SUGGEST RECOLLECTION (A)  Final   Report Status 04/09/2020 FINAL  Final    Procedures and diagnostic studies:  No results found.             LOS: 6 days   BERNARD AYIKU  [facility]   Pager on [email or web address]. If 7PM-7AM, please contact night-coverage at [email or web address]     04/13/2020, 12:30 PM

## 2020-04-14 ENCOUNTER — Other Ambulatory Visit: Payer: Self-pay | Admitting: Internal Medicine

## 2020-04-14 DIAGNOSIS — E538 Deficiency of other specified B group vitamins: Secondary | ICD-10-CM | POA: Diagnosis present

## 2020-04-14 DIAGNOSIS — S72001D Fracture of unspecified part of neck of right femur, subsequent encounter for closed fracture with routine healing: Secondary | ICD-10-CM | POA: Diagnosis not present

## 2020-04-14 MED ORDER — CYANOCOBALAMIN 1000 MCG/ML IJ SOLN
1000.0000 ug | Freq: Once | INTRAMUSCULAR | Status: AC
  Administered 2020-04-14: 1000 ug via SUBCUTANEOUS
  Filled 2020-04-14: qty 1

## 2020-04-14 MED ORDER — VITAMIN B-12 1000 MCG PO TABS: 1000.0000 ug | ORAL_TABLET | Freq: Every day | ORAL | Status: DC

## 2020-04-14 MED ORDER — CYANOCOBALAMIN 1000 MCG PO TABS: 1000.0000 ug | ORAL_TABLET | Freq: Every day | ORAL | Status: AC

## 2020-04-14 NOTE — Progress Notes (Signed)
Physical Therapy Treatment Patient Details Name: Diane Rogers MRN: [id number] DOB: 22-Apr-1928 Today's Date: 04/14/2020    History of Present Illness Pt is an 87 y/o F with PMH that includes HTN, HLD, depression and fall with L hemiarthroplasty in Jan 2020. Pt now presents after sustaining mechanical fall with displaced right femoral neck fracture and is s/p right hip anterior hip hemiarthroplasty.    PT Comments    Daughter in for training.  Educated on bed mobility, standing, transfers, wheelchair safety and mobility and general questions daughter has.  She is able to assist pt with 2 transfers in room and feels comfortable.  Gait belt given.  Encouraged +2 assist initially for safety upon discharge.  Sister is also available to help.  She has no further questions or concerns at this time and feels comfortable with discharge home.   Follow Up Recommendations  Home health PT;Supervision/Assistance - 24 hour     Equipment Recommendations  Wheelchair (measurements PT);Wheelchair cushion (measurements PT)    Recommendations for Other Services       Precautions / Restrictions Precautions Precautions: Fall;Anterior Hip Precaution Booklet Issued: Yes (comment) Restrictions Weight Bearing Restrictions: Yes RLE Weight Bearing: Weight bearing as tolerated    Mobility  Bed Mobility Overal bed mobility: Needs Assistance Bed Mobility: Supine to Sit     Supine to sit: Min guard;Min assist     General bed mobility comments: L side as at home, increased time but overall no physical assist    Transfers Overall transfer level: Needs assistance Equipment used: Rolling walker (2 wheeled)   Sit to Stand: Min assist            Ambulation/Gait Ambulation/Gait assistance: Min assist;Mod assist Gait Distance (Feet): 3 Feet Assistive device: Rolling walker (2 wheeled) Gait Pattern/deviations: Step-to pattern Gait velocity: decreased   General Gait Details: able to transfer  to wheelcahir and recliner with cues and min/mod a  x 1   Stairs             Wheelchair Mobility    Modified Rankin (Stroke Patients Only)       Balance Overall balance assessment: Needs assistance Sitting-balance support: Bilateral upper extremity supported;Feet supported Sitting balance-[name]: Fair     Standing balance support: Bilateral upper extremity supported Standing balance-[name]: Poor Standing balance comment: close min/mod assist but generally improved today                            Cognition Arousal/Alertness: Awake/alert Behavior During Therapy: WFL for tasks assessed/performed Overall Cognitive Status: Within Functional Limits for tasks assessed                                        Exercises Other Exercises Other Exercises: extensive education regarding mobility, equipment and safety at home.    General Comments        Pertinent Vitals/Pain Pain Assessment: Faces Faces Pain Scale: Hurts little more Pain Location: R hip Pain Descriptors / Indicators: Sore Pain Intervention(s): Limited activity within patient's tolerance;Monitored during session;Repositioned    Home Living                      Prior Function            PT Goals (current goals can now be found in the care plan section) Progress towards PT  goals: Progressing toward goals    Frequency    BID      PT Plan Current plan remains appropriate    Co-evaluation              AM-PAC PT "6 Clicks" Mobility   Outcome Measure  Help needed turning from your back to your side while in a flat bed without using bedrails?: A Little Help needed moving from lying on your back to sitting on the side of a flat bed without using bedrails?: A Little Help needed moving to and from a bed to a chair (including a wheelchair)?: A Lot Help needed standing up from a chair using your arms (e.g., wheelchair or bedside chair)?: A Lot Help  needed to walk in hospital room?: A Lot Help needed climbing 3-5 steps with a railing? : Total 6 Click Score: 13    End of Session Equipment Utilized During Treatment: Gait belt Activity Tolerance: Patient tolerated treatment well;Patient limited by fatigue Patient left: in chair;with call bell/phone within reach;with chair alarm set;with family/visitor present Nurse Communication: Mobility status;Weight bearing status PT Visit Diagnosis: Unsteadiness on feet (R26.81);History of falling (Z91.81);Muscle weakness (generalized) (M62.81);Difficulty in walking, not elsewhere classified (R26.2) Pain - Right/Left: Right Pain - part of body: Hip     Time: 0086-7619 PT Time Calculation (min) (ACUTE ONLY): 38 min  Charges:  $Gait Training: 8-22 mins $Therapeutic Activity: 23-37 mins                    Danielle Dess, PTA 04/14/20, 12:10 PM

## 2020-04-14 NOTE — Discharge Instructions (Addendum)
Fall Prevention in the Home, Adult Falls can cause injuries and can happen to people of all ages. There are many things you can do to make your home safe and to help prevent falls. Ask for help when making these changes. What actions can I take to prevent falls? General Instructions  Use good lighting in all rooms. Replace any light bulbs that burn out.  Turn on the lights in dark areas. Use night-lights.  Keep items that you use often in easy-to-reach places. Lower the shelves around your home if needed.  Set up your furniture so you have a clear path. Avoid moving your furniture around.  Do not have throw rugs or other things on the floor that can make you trip.  Avoid walking on wet floors.  If any of your floors are uneven, fix them.  Add color or contrast paint or tape to clearly mark and help you see: ? Grab bars or handrails. ? First and last steps of staircases. ? Where the edge of each step is.  If you use a stepladder: ? Make sure that it is fully opened. Do not climb a closed stepladder. ? Make sure the sides of the stepladder are locked in place. ? Ask someone to hold the stepladder while you use it.  Know where your pets are when moving through your home. What can I do in the bathroom?  Keep the floor dry. Clean up any water on the floor right away.  Remove soap buildup in the tub or shower.  Use nonskid mats or decals on the floor of the tub or shower.  Attach bath mats securely with double-sided, nonslip rug tape.  If you need to sit down in the shower, use a plastic, nonslip stool.  Install grab bars by the toilet and in the tub and shower. Do not use towel bars as grab bars.      What can I do in the bedroom?  Make sure that you have a light by your bed that is easy to reach.  Do not use any sheets or blankets for your bed that hang to the floor.  Have a firm chair with side arms that you can use for support when you get dressed. What can I do in  the kitchen?  Clean up any spills right away.  If you need to reach something above you, use a step stool with a grab bar.  Keep electrical cords out of the way.  Do not use floor polish or wax that makes floors slippery. What can I do with my stairs?  Do not leave any items on the stairs.  Make sure that you have a light switch at the top and the bottom of the stairs.  Make sure that there are handrails on both sides of the stairs. Fix handrails that are broken or loose.  Install nonslip stair treads on all your stairs.  Avoid having throw rugs at the top or bottom of the stairs.  Choose a carpet that does not hide the edge of the steps on the stairs.  Check carpeting to make sure that it is firmly attached to the stairs. Fix carpet that is loose or worn. What can I do on the outside of my home?  Use bright outdoor lighting.  Fix the edges of walkways and driveways and fix any cracks.  Remove anything that might make you trip as you walk through a door, such as a raised step or threshold.  Trim any   bushes or trees on paths to your home.  Check to see if handrails are loose or broken and that both sides of all steps have handrails.  Install guardrails along the edges of any raised decks and porches.  Clear paths of anything that can make you trip, such as tools or rocks.  Have leaves, snow, or ice cleared regularly.  Use sand or salt on paths during winter.  Clean up any spills in your garage right away. This includes grease or oil spills. What other actions can I take?  Wear shoes that: ? Have a low heel. Do not wear high heels. ? Have rubber bottoms. ? Feel good on your feet and fit well. ? Are closed at the toe. Do not wear open-toe sandals.  Use tools that help you move around if needed. These include: ? Canes. ? Walkers. ? Scooters. ? Crutches.  Review your medicines with your doctor. Some medicines can make you feel dizzy. This can increase your chance  of falling. Ask your doctor what else you can do to help prevent falls. Where to find more information  Centers for Disease Control and Prevention, [employer]: www.cdc.gov  National Institute on Aging: www.nia.nih.gov Contact a doctor if:  You are afraid of falling at home.  You feel weak, drowsy, or dizzy at home.  You fall at home. Summary  There are many simple things that you can do to make your home safe and to help prevent falls.  Ways to make your home safe include removing things that can make you trip and installing grab bars in the bathroom.  Ask for help when making these changes in your home. This information is not intended to replace advice given to you by your health care provider. Make sure you discuss any questions you have with your health care provider. Document Revised: 08/29/2019 Document Reviewed: 08/29/2019 Elsevier Patient Education  2021 [employer]  

## 2020-04-14 NOTE — Care Management Important Message (Signed)
Important Message  Patient Details  Name: Diane Rogers MRN: [id number] Date of Birth: [birth year]   Medicare Important Message Given:  Yes     [name] A [name] 04/14/2020, 10:30 AM

## 2020-04-14 NOTE — TOC Transition Note (Addendum)
Transition of Care [facility]) - CM/SW Discharge Note   Patient Details  Name: Diane Rogers MRN: [id number] Date of Birth: 10/15/1928  Transition of Care [facility]) CM/SW Contact:  Liliana Cline, LCSW Phone Number: 04/14/2020, 9:43 AM   Clinical Narrative:   Patient to discharge home today. Spoke with daughter Bonita Quin who requested EMS transport home, CSW will schedule when RN is ready for transport. Confirmed home address in chart. CSW notified [facility] Representative [place] of [place] and that Bonita Quin is the main contact for scheduling. Confirmed with [employer] Representative Elease Hashimoto that they will deliver wheelchair to patient's home today.  11:00- Called [facility] to schedule transport. Transport scheduled for 12:30 pick up (next available). Updated RN and daughter Bonita Quin.   Final next level of care: Home w Home Health Services Barriers to Discharge: Barriers Resolved   Patient Goals and CMS Choice Patient states their goals for this hospitalization and ongoing recovery are:: home with home health CMS Medicare.gov Compare Post Acute Care list provided to:: Patient Represenative (must comment) Choice offered to / list presented to : Adult Children  Discharge Placement                Patient to be transferred to facility by: EMS Name of family member notified: Kalianne Fetting - daughter Patient and family notified of of transfer: 04/14/20  Discharge Plan and Services                DME Arranged: Wheelchair manual DME Agency: [employer] Date DME Agency Contacted: 04/14/20   Representative spoke with at DME Agency: patricia Felipa [facility] Arranged: PT,OT,RN,Nurse's Aide HH Agency: [facility] Date HH Agency Contacted: 04/14/20   Representative spoke with at [facility] Agency: Mellissa Kohut  Social Determinants of Health (SDOH) Interventions     Readmission Risk Interventions No flowsheet data found.

## 2020-04-14 NOTE — Progress Notes (Signed)
Pt IV removed, dressing c,d,i. Pt family educated on d/c info. All questions answered. Pt given extra aquacel dressing. Pt has all belongings, awaiting transport.

## 2020-04-14 NOTE — Discharge Summary (Signed)
Physician Discharge Summary  Diane Rogers SUP:[id number] DOB: March 10, 1928 DOA: 04/07/2020  PCP: [facility]  Admit date: 04/07/2020 Discharge date: 04/14/2020  Discharge disposition: Home with home health therapy   Recommendations for Outpatient Follow-Up:   Follow-up with Dr. Odis Luster, orthopedic surgeon in 2 weeks. Follow-up with PCP in 1 to 2 weeks   Discharge Diagnosis:   Principal Problem:   Closed right hip fracture (HCC) Active Problems:   Essential hypertension, benign   Lower extremity edema   Dyslipidemia   Anemia of chronic disease   Vitamin D deficiency   Peripheral neuropathy   Vitamin B12 deficiency    Discharge Condition: Stable.  Diet recommendation:  Diet Order            Diet - low sodium heart healthy           Diet regular Room service appropriate? Yes; Fluid consistency: Thin  Diet effective now                   Code Status: Full Code     Hospital Course:   [name] a 87 y.o.femalewith medical history significant ofhypertension, hyperlipidemia, depression, impaired glucose tolerance, who presented to the ED with complaints of progressively worsening right hip pain. She said she sustained a fall on 03/29/2020. She presented to the ED on 04/07/2020.   She was found to have acute right femoral neck fracture.  She was treated with analgesics.  She was evaluated by orthopedic surgeon, Dr. Odis Luster.  She underwent right anterior hip hemiarthroplasty on 04/09/2020.  She was given IV fluids for dehydration.  Her urinalysis was abnormal concerning for UTI.  Initially, she was treated with IV Rocephin.  Urine culture showed multiple species.  UTI was ruled out so IV Rocephin was discontinued.  Hospital course course was complicated by acute confusional state that was attributed to delirium.  This has resolved.  She also developed acute blood loss anemia.  However, there was no indication for blood  transfusion.  She was evaluated by PT and OT who recommended discharge home with home health therapy. Her condition has improved and she is deemed stable for discharge to home.    Discharge Exam:    Vitals:   04/13/20 1959 04/14/20 0109 04/14/20 0602 04/14/20 0841  BP: (!) 151/60 (!) 152/57 (!) 173/76 (!) 165/77  Pulse: 81 72 87 87  Resp: 16 16 15 19   Temp: 98 F (36.7 C) 98 F (36.7 C) 98.2 F (36.8 C) 98.8 F (37.1 C)  TempSrc:  Oral Oral   SpO2: 97% 96% 98% 93%  Weight:      Height:        GEN: NAD SKIN: Warm and dry EYES: No pallor icterus, EOMI ENT: MMM CV: RRR PULM: CTA B ABD: soft, ND, NT, +BS CNS: AAO x 3, non focal EXT: Right right hip tenderness.  Dressing on right hip surgical wound is intact.     The results of significant diagnostics from this hospitalization (including imaging, microbiology, ancillary and laboratory) are listed below for reference.     Procedures and Diagnostic Studies:   DG Chest 1 View  Result Date: 04/07/2020 CLINICAL DATA:  04/09/2020 2 days ago EXAM: CHEST  1 VIEW COMPARISON:  03/02/2018 FINDINGS: Mild cardiomegaly with aortic atherosclerosis. No focal opacity or pleural effusion. No pneumothorax. IMPRESSION: No active disease. Mild cardiomegaly. Electronically Signed   By: 03/04/2018 M.D.   On: 04/07/2020 15:11   DG Pelvis  1-2 Views  Result Date: 04/07/2020 CLINICAL DATA:  Fall with hip pain EXAM: PELVIS - 1-2 VIEW COMPARISON:  03/03/2018 FINDINGS: Previous left hip replacement with normal alignment. Pubic symphysis and rami appear intact. Acute mildly displaced right femoral neck fracture. IMPRESSION: Acute mildly displaced right femoral neck fracture. Electronically Signed   By: Jasmine Pang M.D.   On: 04/07/2020 15:09   DG Femur Min 2 Views Right  Result Date: 04/07/2020 CLINICAL DATA:  Right-sided hip pain after fall 2 days ago EXAM: RIGHT FEMUR 2 VIEWS COMPARISON:  03/03/2018 FINDINGS: Acute displaced right femoral neck  fracture. Femoral head projects in joint. The remainder of the right femur is intact. There are vascular calcifications. IMPRESSION: Acute displaced right femoral neck fracture. Electronically Signed   By: Jasmine Pang M.D.   On: 04/07/2020 15:09     Labs:   Basic Metabolic Panel: Recent Labs  Lab 04/07/20 1629 04/08/20 0514 04/13/20 0428  NA 136 140 138  K 4.1 4.1 3.6  CL 102 105 103  CO2 25 28 26   GLUCOSE 105* 103* 91  BUN 21 23 19   CREATININE 1.05* 0.96 0.94  CALCIUM 8.9 8.6* 8.5*   GFR Estimated Creatinine Clearance: 35.5 mL/min (by C-G formula based on SCr of 0.94 mg/dL). Liver Function Tests: No results for input(s): AST, ALT, ALKPHOS, BILITOT, PROT, ALBUMIN in the last 168 hours. No results for input(s): LIPASE, AMYLASE in the last 168 hours. No results for input(s): AMMONIA in the last 168 hours. Coagulation profile Recent Labs  Lab 04/07/20 1629  INR 1.1    CBC: Recent Labs  Lab 04/07/20 1629 04/08/20 0514 04/09/20 0429 04/10/20 0618 04/11/20 0430 04/13/20 0428  WBC 15.0* 12.2* 12.3* 13.2* 12.5* 11.4*  NEUTROABS 12.4*  --   --  11.0* 10.1* 8.6*  HGB 14.8 13.7 13.8 12.3 11.8* 12.4  HCT 43.4 41.7 43.1 36.9 36.8 37.4  MCV 92.3 93.1 93.3 92.7 93.6 93.3  PLT 254 231 250 199 195 288   Cardiac Enzymes: No results for input(s): CKTOTAL, CKMB, CKMBINDEX, TROPONINI in the last 168 hours. BNP: Invalid input(s): POCBNP CBG: No results for input(s): GLUCAP in the last 168 hours. D-Dimer No results for input(s): DDIMER in the last 72 hours. Hgb A1c No results for input(s): HGBA1C in the last 72 hours. Lipid Profile No results for input(s): CHOL, HDL, LDLCALC, TRIG, CHOLHDL, LDLDIRECT in the last 72 hours. Thyroid function studies No results for input(s): TSH, T4TOTAL, T3FREE, THYROIDAB in the last 72 hours.  Invalid input(s): FREET3 Anemia work up Recent Labs    04/12/20 1650  VITAMINB12 140*   Microbiology Recent Results (from the past 240  hour(s))  Resp Panel by RT-PCR (Flu A&B, Covid) Nasopharyngeal Swab     Status: None   Collection Time: 04/07/20  5:16 PM   Specimen: Nasopharyngeal Swab; Nasopharyngeal(NP) swabs in vial transport medium  Result Value Ref Range Status   SARS Coronavirus 2 by RT PCR NEGATIVE NEGATIVE Final    Comment: (NOTE) SARS-CoV-2 target nucleic acids are NOT DETECTED.  The SARS-CoV-2 RNA is generally detectable in upper respiratory specimens during the acute phase of infection. The lowest concentration of SARS-CoV-2 viral copies this assay can detect is 138 copies/mL. A negative result does not preclude SARS-Cov-2 infection and should not be used as the sole basis for treatment or other patient management decisions. A negative result may occur with  improper specimen collection/handling, submission of specimen other than nasopharyngeal swab, presence of viral mutation(s) within the areas targeted  by this assay, and inadequate number of viral copies(<138 copies/mL). A negative result must be combined with clinical observations, patient history, and epidemiological information. The expected result is Negative.  Fact Sheet for Patients:  BloggerCourse.comhttps://www.fda.gov/media/152166/download  Fact Sheet for Healthcare Providers:  SeriousBroker.ithttps://www.fda.gov/media/152162/download  This test is no t yet approved or cleared by the [place] FDA and  has been authorized for detection and/or diagnosis of SARS-CoV-2 by FDA under an Emergency Use Authorization (EUA). This EUA will remain  in effect (meaning this test can be used) for the duration of the COVID-19 declaration under Section 564(b)(1) of the Act, 21 U.S.C.section 360bbb-3(b)(1), unless the authorization is terminated  or revoked sooner.       Influenza A by PCR NEGATIVE NEGATIVE Final   Influenza B by PCR NEGATIVE NEGATIVE Final    Comment: (NOTE) The Xpert Xpress SARS-CoV-2/FLU/RSV plus assay is intended as an aid in the diagnosis of influenza from  Nasopharyngeal swab specimens and should not be used as a sole basis for treatment. Nasal washings and aspirates are unacceptable for Xpert Xpress SARS-CoV-2/FLU/RSV testing.  Fact Sheet for Patients: BloggerCourse.comhttps://www.fda.gov/media/152166/download  Fact Sheet for Healthcare Providers: SeriousBroker.ithttps://www.fda.gov/media/152162/download  This test is not yet approved or cleared by the [place] FDA and has been authorized for detection and/or diagnosis of SARS-CoV-2 by FDA under an Emergency Use Authorization (EUA). This EUA will remain in effect (meaning this test can be used) for the duration of the COVID-19 declaration under Section 564(b)(1) of the Act, 21 U.S.C. section 360bbb-3(b)(1), unless the authorization is terminated or revoked.  Performed at [facility], [address] [place], [place] [address]   Surgical pcr screen     Status: None   Collection Time: 04/08/20  1:33 AM   Specimen: Nasal Mucosa; Nasal Swab  Result Value Ref Range Status   MRSA, PCR NEGATIVE NEGATIVE Final   Staphylococcus aureus NEGATIVE NEGATIVE Final    Comment: (NOTE) The Xpert SA Assay (FDA approved for NASAL specimens in patients 85 years of age and older), is one component of a comprehensive surveillance program. It is not intended to diagnose infection nor to guide or monitor treatment. Performed at [facility], [address] [place], [place] [address]   Urine Culture     Status: Abnormal   Collection Time: 04/08/20  3:30 AM   Specimen: Urine, Random  Result Value Ref Range Status   Specimen Description   Final    URINE, RANDOM Performed at [facility], [address] [place], [place] [address]    Special Requests   Final    NONE Performed at [facility], [address] [place], [place] [address]    Culture MULTIPLE SPECIES PRESENT, SUGGEST RECOLLECTION (A)  Final   Report Status 04/09/2020 FINAL  Final     Discharge Instructions:    Discharge Instructions    Diet - low sodium heart healthy   Complete by: As directed    Discharge wound care:   Complete by: As directed    Keep wound clean and dry.  Follow-up with Dr. Odis LusterBowers, orthopedic surgeon, in 2 weeks for staple removal   For home use only DME wheelchair cushion (seat and back)   Complete by: As directed    Increase activity slowly   Complete by: As directed    Schedule appointment   Complete by: As directed    Follow-up with PCP in 1 to 2 weeks     Allergies as of 04/14/2020  Reactions   Penicillins Other (See Comments)   Unknown reaction, was always told she had allergy      Medication List    STOP taking these medications   aspirin 325 MG tablet Replaced by: aspirin 81 MG EC tablet   Dermacloud Crea   NON FORMULARY     TAKE these medications   acetaminophen 325 MG tablet Commonly known as: TYLENOL Take 650 mg by mouth every 6 (six) hours as needed.   amLODipine-benazepril 10-20 MG capsule Commonly known as: LOTREL Take 1 capsule by mouth daily.   aspirin 81 MG EC tablet Take 1 tablet (81 mg total) by mouth 2 (two) times daily. Swallow whole. Replaces: aspirin 325 MG tablet   bisacodyl 5 MG EC tablet Commonly known as: bisacodyl Take 1 tablet (5 mg total) by mouth daily as needed for moderate constipation.   calcium carbonate 500 MG chewable tablet Commonly known as: TUMS - dosed in mg elemental calcium Chew 1 tablet by mouth 3 (three) times daily.   cyanocobalamin 1000 MCG tablet Take 1 tablet (1,000 mcg total) by mouth daily. Start taking on: April 15, 2020   HYDROcodone-acetaminophen 5-325 MG tablet Commonly known as: NORCO/VICODIN Take 1 tablet by mouth every 8 (eight) hours as needed for moderate pain.   polyethylene glycol 17 g packet Commonly known as: MIRALAX / GLYCOLAX Take 17 g by mouth daily as needed for mild constipation.            Durable Medical Equipment  (From admission, onward)         Start      Ordered   04/13/20 0000  For home use only DME wheelchair cushion (seat and back)        04/13/20 1649           Discharge Care Instructions  (From admission, onward)         Start     Ordered   04/13/20 0000  Discharge wound care:       Comments: Keep wound clean and dry.  Follow-up with Dr. Odis Luster, orthopedic surgeon, in 2 weeks for staple removal   04/13/20 1030          Follow-up Information    Lyndle Herrlich, MD Follow up in 2 week(s).   Specialty: Orthopedic Surgery Contact information: [address] [place] [address] [phone]                Time coordinating discharge: 28 minutes  Signed:  Lurene Shadow  [facility] 04/14/2020, 9:02 AM   Pager on [email or web address]. If 7PM-7AM, please contact night-coverage at [email or web address]

## 2020-04-17 DIAGNOSIS — E785 Hyperlipidemia, unspecified: Secondary | ICD-10-CM | POA: Diagnosis not present

## 2020-04-17 DIAGNOSIS — H9193 Unspecified hearing loss, bilateral: Secondary | ICD-10-CM | POA: Diagnosis not present

## 2020-04-17 DIAGNOSIS — D649 Anemia, unspecified: Secondary | ICD-10-CM | POA: Diagnosis not present

## 2020-04-17 DIAGNOSIS — I10 Essential (primary) hypertension: Secondary | ICD-10-CM | POA: Diagnosis not present

## 2020-04-17 DIAGNOSIS — G629 Polyneuropathy, unspecified: Secondary | ICD-10-CM | POA: Diagnosis not present

## 2020-04-17 DIAGNOSIS — S72041D Displaced fracture of base of neck of right femur, subsequent encounter for closed fracture with routine healing: Secondary | ICD-10-CM | POA: Diagnosis not present

## 2020-04-17 DIAGNOSIS — Z9181 History of falling: Secondary | ICD-10-CM | POA: Diagnosis not present

## 2020-04-17 DIAGNOSIS — F32A Depression, unspecified: Secondary | ICD-10-CM | POA: Diagnosis not present

## 2020-04-21 DIAGNOSIS — D649 Anemia, unspecified: Secondary | ICD-10-CM | POA: Diagnosis not present

## 2020-04-21 DIAGNOSIS — Z9181 History of falling: Secondary | ICD-10-CM | POA: Diagnosis not present

## 2020-04-21 DIAGNOSIS — H9193 Unspecified hearing loss, bilateral: Secondary | ICD-10-CM | POA: Diagnosis not present

## 2020-04-21 DIAGNOSIS — G629 Polyneuropathy, unspecified: Secondary | ICD-10-CM | POA: Diagnosis not present

## 2020-04-21 DIAGNOSIS — I10 Essential (primary) hypertension: Secondary | ICD-10-CM | POA: Diagnosis not present

## 2020-04-21 DIAGNOSIS — E785 Hyperlipidemia, unspecified: Secondary | ICD-10-CM | POA: Diagnosis not present

## 2020-04-21 DIAGNOSIS — S72041D Displaced fracture of base of neck of right femur, subsequent encounter for closed fracture with routine healing: Secondary | ICD-10-CM | POA: Diagnosis not present

## 2020-04-21 DIAGNOSIS — F32A Depression, unspecified: Secondary | ICD-10-CM | POA: Diagnosis not present

## 2020-04-24 DIAGNOSIS — F32A Depression, unspecified: Secondary | ICD-10-CM | POA: Diagnosis not present

## 2020-04-24 DIAGNOSIS — E785 Hyperlipidemia, unspecified: Secondary | ICD-10-CM | POA: Diagnosis not present

## 2020-04-24 DIAGNOSIS — Z9181 History of falling: Secondary | ICD-10-CM | POA: Diagnosis not present

## 2020-04-24 DIAGNOSIS — I10 Essential (primary) hypertension: Secondary | ICD-10-CM | POA: Diagnosis not present

## 2020-04-24 DIAGNOSIS — H9193 Unspecified hearing loss, bilateral: Secondary | ICD-10-CM | POA: Diagnosis not present

## 2020-04-24 DIAGNOSIS — G629 Polyneuropathy, unspecified: Secondary | ICD-10-CM | POA: Diagnosis not present

## 2020-04-24 DIAGNOSIS — D649 Anemia, unspecified: Secondary | ICD-10-CM | POA: Diagnosis not present

## 2020-04-24 DIAGNOSIS — S72041D Displaced fracture of base of neck of right femur, subsequent encounter for closed fracture with routine healing: Secondary | ICD-10-CM | POA: Diagnosis not present

## 2020-04-28 DIAGNOSIS — I10 Essential (primary) hypertension: Secondary | ICD-10-CM | POA: Diagnosis not present

## 2020-04-28 DIAGNOSIS — S72041D Displaced fracture of base of neck of right femur, subsequent encounter for closed fracture with routine healing: Secondary | ICD-10-CM | POA: Diagnosis not present

## 2020-04-28 DIAGNOSIS — G629 Polyneuropathy, unspecified: Secondary | ICD-10-CM | POA: Diagnosis not present

## 2020-04-28 DIAGNOSIS — E785 Hyperlipidemia, unspecified: Secondary | ICD-10-CM | POA: Diagnosis not present

## 2020-04-28 DIAGNOSIS — F32A Depression, unspecified: Secondary | ICD-10-CM | POA: Diagnosis not present

## 2020-04-28 DIAGNOSIS — H9193 Unspecified hearing loss, bilateral: Secondary | ICD-10-CM | POA: Diagnosis not present

## 2020-04-28 DIAGNOSIS — Z9181 History of falling: Secondary | ICD-10-CM | POA: Diagnosis not present

## 2020-04-28 DIAGNOSIS — D649 Anemia, unspecified: Secondary | ICD-10-CM | POA: Diagnosis not present

## 2020-04-29 DIAGNOSIS — S72041D Displaced fracture of base of neck of right femur, subsequent encounter for closed fracture with routine healing: Secondary | ICD-10-CM | POA: Diagnosis not present

## 2020-04-29 DIAGNOSIS — Z9181 History of falling: Secondary | ICD-10-CM | POA: Diagnosis not present

## 2020-04-29 DIAGNOSIS — E785 Hyperlipidemia, unspecified: Secondary | ICD-10-CM | POA: Diagnosis not present

## 2020-04-29 DIAGNOSIS — I10 Essential (primary) hypertension: Secondary | ICD-10-CM | POA: Diagnosis not present

## 2020-04-29 DIAGNOSIS — F32A Depression, unspecified: Secondary | ICD-10-CM | POA: Diagnosis not present

## 2020-04-29 DIAGNOSIS — G629 Polyneuropathy, unspecified: Secondary | ICD-10-CM | POA: Diagnosis not present

## 2020-04-29 DIAGNOSIS — D649 Anemia, unspecified: Secondary | ICD-10-CM | POA: Diagnosis not present

## 2020-04-29 DIAGNOSIS — H9193 Unspecified hearing loss, bilateral: Secondary | ICD-10-CM | POA: Diagnosis not present

## 2020-05-01 DIAGNOSIS — G629 Polyneuropathy, unspecified: Secondary | ICD-10-CM | POA: Diagnosis not present

## 2020-05-01 DIAGNOSIS — I10 Essential (primary) hypertension: Secondary | ICD-10-CM | POA: Diagnosis not present

## 2020-05-01 DIAGNOSIS — S72041D Displaced fracture of base of neck of right femur, subsequent encounter for closed fracture with routine healing: Secondary | ICD-10-CM | POA: Diagnosis not present

## 2020-05-01 DIAGNOSIS — D649 Anemia, unspecified: Secondary | ICD-10-CM | POA: Diagnosis not present

## 2020-05-01 DIAGNOSIS — F32A Depression, unspecified: Secondary | ICD-10-CM | POA: Diagnosis not present

## 2020-05-01 DIAGNOSIS — Z9181 History of falling: Secondary | ICD-10-CM | POA: Diagnosis not present

## 2020-05-01 DIAGNOSIS — H9193 Unspecified hearing loss, bilateral: Secondary | ICD-10-CM | POA: Diagnosis not present

## 2020-05-01 DIAGNOSIS — E785 Hyperlipidemia, unspecified: Secondary | ICD-10-CM | POA: Diagnosis not present

## 2020-05-06 DIAGNOSIS — G629 Polyneuropathy, unspecified: Secondary | ICD-10-CM | POA: Diagnosis not present

## 2020-05-06 DIAGNOSIS — S72041D Displaced fracture of base of neck of right femur, subsequent encounter for closed fracture with routine healing: Secondary | ICD-10-CM | POA: Diagnosis not present

## 2020-05-06 DIAGNOSIS — D649 Anemia, unspecified: Secondary | ICD-10-CM | POA: Diagnosis not present

## 2020-05-06 DIAGNOSIS — E785 Hyperlipidemia, unspecified: Secondary | ICD-10-CM | POA: Diagnosis not present

## 2020-05-06 DIAGNOSIS — H9193 Unspecified hearing loss, bilateral: Secondary | ICD-10-CM | POA: Diagnosis not present

## 2020-05-06 DIAGNOSIS — Z9181 History of falling: Secondary | ICD-10-CM | POA: Diagnosis not present

## 2020-05-06 DIAGNOSIS — F32A Depression, unspecified: Secondary | ICD-10-CM | POA: Diagnosis not present

## 2020-05-06 DIAGNOSIS — I10 Essential (primary) hypertension: Secondary | ICD-10-CM | POA: Diagnosis not present

## 2020-05-08 DIAGNOSIS — H9193 Unspecified hearing loss, bilateral: Secondary | ICD-10-CM | POA: Diagnosis not present

## 2020-05-08 DIAGNOSIS — F32A Depression, unspecified: Secondary | ICD-10-CM | POA: Diagnosis not present

## 2020-05-08 DIAGNOSIS — I10 Essential (primary) hypertension: Secondary | ICD-10-CM | POA: Diagnosis not present

## 2020-05-08 DIAGNOSIS — G629 Polyneuropathy, unspecified: Secondary | ICD-10-CM | POA: Diagnosis not present

## 2020-05-08 DIAGNOSIS — E785 Hyperlipidemia, unspecified: Secondary | ICD-10-CM | POA: Diagnosis not present

## 2020-05-08 DIAGNOSIS — S72041D Displaced fracture of base of neck of right femur, subsequent encounter for closed fracture with routine healing: Secondary | ICD-10-CM | POA: Diagnosis not present

## 2020-05-08 DIAGNOSIS — D649 Anemia, unspecified: Secondary | ICD-10-CM | POA: Diagnosis not present

## 2020-05-08 DIAGNOSIS — Z9181 History of falling: Secondary | ICD-10-CM | POA: Diagnosis not present

## 2020-05-13 DIAGNOSIS — G629 Polyneuropathy, unspecified: Secondary | ICD-10-CM | POA: Diagnosis not present

## 2020-05-13 DIAGNOSIS — S72041D Displaced fracture of base of neck of right femur, subsequent encounter for closed fracture with routine healing: Secondary | ICD-10-CM | POA: Diagnosis not present

## 2020-05-13 DIAGNOSIS — I10 Essential (primary) hypertension: Secondary | ICD-10-CM | POA: Diagnosis not present

## 2020-05-13 DIAGNOSIS — F32A Depression, unspecified: Secondary | ICD-10-CM | POA: Diagnosis not present

## 2020-05-13 DIAGNOSIS — H9193 Unspecified hearing loss, bilateral: Secondary | ICD-10-CM | POA: Diagnosis not present

## 2020-05-13 DIAGNOSIS — D649 Anemia, unspecified: Secondary | ICD-10-CM | POA: Diagnosis not present

## 2020-05-13 DIAGNOSIS — E785 Hyperlipidemia, unspecified: Secondary | ICD-10-CM | POA: Diagnosis not present

## 2020-05-13 DIAGNOSIS — Z9181 History of falling: Secondary | ICD-10-CM | POA: Diagnosis not present

## 2020-05-15 DIAGNOSIS — S72012D Unspecified intracapsular fracture of left femur, subsequent encounter for closed fracture with routine healing: Secondary | ICD-10-CM | POA: Diagnosis not present

## 2020-05-15 DIAGNOSIS — M25572 Pain in left ankle and joints of left foot: Secondary | ICD-10-CM | POA: Diagnosis not present

## 2020-05-15 DIAGNOSIS — Z96642 Presence of left artificial hip joint: Secondary | ICD-10-CM | POA: Diagnosis not present

## 2020-05-17 DIAGNOSIS — G629 Polyneuropathy, unspecified: Secondary | ICD-10-CM | POA: Diagnosis not present

## 2020-05-17 DIAGNOSIS — F32A Depression, unspecified: Secondary | ICD-10-CM | POA: Diagnosis not present

## 2020-05-17 DIAGNOSIS — I10 Essential (primary) hypertension: Secondary | ICD-10-CM | POA: Diagnosis not present

## 2020-05-17 DIAGNOSIS — D649 Anemia, unspecified: Secondary | ICD-10-CM | POA: Diagnosis not present

## 2020-05-17 DIAGNOSIS — S72041D Displaced fracture of base of neck of right femur, subsequent encounter for closed fracture with routine healing: Secondary | ICD-10-CM | POA: Diagnosis not present

## 2020-05-17 DIAGNOSIS — H9193 Unspecified hearing loss, bilateral: Secondary | ICD-10-CM | POA: Diagnosis not present

## 2020-05-17 DIAGNOSIS — E785 Hyperlipidemia, unspecified: Secondary | ICD-10-CM | POA: Diagnosis not present

## 2020-05-17 DIAGNOSIS — Z9181 History of falling: Secondary | ICD-10-CM | POA: Diagnosis not present

## 2020-05-21 DIAGNOSIS — I10 Essential (primary) hypertension: Secondary | ICD-10-CM | POA: Diagnosis not present

## 2020-05-21 DIAGNOSIS — H9193 Unspecified hearing loss, bilateral: Secondary | ICD-10-CM | POA: Diagnosis not present

## 2020-05-21 DIAGNOSIS — Z9181 History of falling: Secondary | ICD-10-CM | POA: Diagnosis not present

## 2020-05-21 DIAGNOSIS — S72041D Displaced fracture of base of neck of right femur, subsequent encounter for closed fracture with routine healing: Secondary | ICD-10-CM | POA: Diagnosis not present

## 2020-05-21 DIAGNOSIS — F32A Depression, unspecified: Secondary | ICD-10-CM | POA: Diagnosis not present

## 2020-05-21 DIAGNOSIS — G629 Polyneuropathy, unspecified: Secondary | ICD-10-CM | POA: Diagnosis not present

## 2020-05-21 DIAGNOSIS — E785 Hyperlipidemia, unspecified: Secondary | ICD-10-CM | POA: Diagnosis not present

## 2020-05-21 DIAGNOSIS — D649 Anemia, unspecified: Secondary | ICD-10-CM | POA: Diagnosis not present

## 2020-05-28 DIAGNOSIS — G629 Polyneuropathy, unspecified: Secondary | ICD-10-CM | POA: Diagnosis not present

## 2020-05-28 DIAGNOSIS — I10 Essential (primary) hypertension: Secondary | ICD-10-CM | POA: Diagnosis not present

## 2020-05-28 DIAGNOSIS — Z9181 History of falling: Secondary | ICD-10-CM | POA: Diagnosis not present

## 2020-05-28 DIAGNOSIS — S72041D Displaced fracture of base of neck of right femur, subsequent encounter for closed fracture with routine healing: Secondary | ICD-10-CM | POA: Diagnosis not present

## 2020-05-28 DIAGNOSIS — E785 Hyperlipidemia, unspecified: Secondary | ICD-10-CM | POA: Diagnosis not present

## 2020-05-28 DIAGNOSIS — D649 Anemia, unspecified: Secondary | ICD-10-CM | POA: Diagnosis not present

## 2020-05-28 DIAGNOSIS — F32A Depression, unspecified: Secondary | ICD-10-CM | POA: Diagnosis not present

## 2020-05-28 DIAGNOSIS — H9193 Unspecified hearing loss, bilateral: Secondary | ICD-10-CM | POA: Diagnosis not present

## 2020-06-02 DIAGNOSIS — Z9181 History of falling: Secondary | ICD-10-CM | POA: Diagnosis not present

## 2020-06-02 DIAGNOSIS — H9193 Unspecified hearing loss, bilateral: Secondary | ICD-10-CM | POA: Diagnosis not present

## 2020-06-02 DIAGNOSIS — I10 Essential (primary) hypertension: Secondary | ICD-10-CM | POA: Diagnosis not present

## 2020-06-02 DIAGNOSIS — G629 Polyneuropathy, unspecified: Secondary | ICD-10-CM | POA: Diagnosis not present

## 2020-06-02 DIAGNOSIS — E785 Hyperlipidemia, unspecified: Secondary | ICD-10-CM | POA: Diagnosis not present

## 2020-06-02 DIAGNOSIS — S72041D Displaced fracture of base of neck of right femur, subsequent encounter for closed fracture with routine healing: Secondary | ICD-10-CM | POA: Diagnosis not present

## 2020-06-02 DIAGNOSIS — F32A Depression, unspecified: Secondary | ICD-10-CM | POA: Diagnosis not present

## 2020-06-02 DIAGNOSIS — D649 Anemia, unspecified: Secondary | ICD-10-CM | POA: Diagnosis not present

## 2020-06-11 DIAGNOSIS — S72041D Displaced fracture of base of neck of right femur, subsequent encounter for closed fracture with routine healing: Secondary | ICD-10-CM | POA: Diagnosis not present

## 2020-06-11 DIAGNOSIS — Z9181 History of falling: Secondary | ICD-10-CM | POA: Diagnosis not present

## 2020-06-11 DIAGNOSIS — E785 Hyperlipidemia, unspecified: Secondary | ICD-10-CM | POA: Diagnosis not present

## 2020-06-11 DIAGNOSIS — F32A Depression, unspecified: Secondary | ICD-10-CM | POA: Diagnosis not present

## 2020-06-11 DIAGNOSIS — H9193 Unspecified hearing loss, bilateral: Secondary | ICD-10-CM | POA: Diagnosis not present

## 2020-06-11 DIAGNOSIS — G629 Polyneuropathy, unspecified: Secondary | ICD-10-CM | POA: Diagnosis not present

## 2020-06-11 DIAGNOSIS — D649 Anemia, unspecified: Secondary | ICD-10-CM | POA: Diagnosis not present

## 2020-06-11 DIAGNOSIS — I10 Essential (primary) hypertension: Secondary | ICD-10-CM | POA: Diagnosis not present

## 2020-06-14 DIAGNOSIS — M25572 Pain in left ankle and joints of left foot: Secondary | ICD-10-CM | POA: Diagnosis not present

## 2020-06-14 DIAGNOSIS — Z96642 Presence of left artificial hip joint: Secondary | ICD-10-CM | POA: Diagnosis not present

## 2020-06-14 DIAGNOSIS — S72012D Unspecified intracapsular fracture of left femur, subsequent encounter for closed fracture with routine healing: Secondary | ICD-10-CM | POA: Diagnosis not present

## 2020-07-15 DIAGNOSIS — S72012D Unspecified intracapsular fracture of left femur, subsequent encounter for closed fracture with routine healing: Secondary | ICD-10-CM | POA: Diagnosis not present

## 2020-07-15 DIAGNOSIS — Z96642 Presence of left artificial hip joint: Secondary | ICD-10-CM | POA: Diagnosis not present

## 2020-07-15 DIAGNOSIS — M25572 Pain in left ankle and joints of left foot: Secondary | ICD-10-CM | POA: Diagnosis not present

## 2020-08-14 DIAGNOSIS — S72012D Unspecified intracapsular fracture of left femur, subsequent encounter for closed fracture with routine healing: Secondary | ICD-10-CM | POA: Diagnosis not present

## 2020-08-14 DIAGNOSIS — M25572 Pain in left ankle and joints of left foot: Secondary | ICD-10-CM | POA: Diagnosis not present

## 2020-08-14 DIAGNOSIS — Z96642 Presence of left artificial hip joint: Secondary | ICD-10-CM | POA: Diagnosis not present

## 2020-09-14 DIAGNOSIS — Z96642 Presence of left artificial hip joint: Secondary | ICD-10-CM | POA: Diagnosis not present

## 2020-09-14 DIAGNOSIS — S72012D Unspecified intracapsular fracture of left femur, subsequent encounter for closed fracture with routine healing: Secondary | ICD-10-CM | POA: Diagnosis not present

## 2020-09-14 DIAGNOSIS — M25572 Pain in left ankle and joints of left foot: Secondary | ICD-10-CM | POA: Diagnosis not present

## 2020-10-15 DIAGNOSIS — I10 Essential (primary) hypertension: Secondary | ICD-10-CM | POA: Diagnosis not present

## 2020-10-15 DIAGNOSIS — F419 Anxiety disorder, unspecified: Secondary | ICD-10-CM | POA: Diagnosis not present

## 2020-10-15 DIAGNOSIS — E559 Vitamin D deficiency, unspecified: Secondary | ICD-10-CM | POA: Diagnosis not present

## 2020-10-15 DIAGNOSIS — Z96642 Presence of left artificial hip joint: Secondary | ICD-10-CM | POA: Diagnosis not present

## 2020-10-15 DIAGNOSIS — M25572 Pain in left ankle and joints of left foot: Secondary | ICD-10-CM | POA: Diagnosis not present

## 2020-10-15 DIAGNOSIS — D649 Anemia, unspecified: Secondary | ICD-10-CM | POA: Diagnosis not present

## 2020-10-15 DIAGNOSIS — S72012D Unspecified intracapsular fracture of left femur, subsequent encounter for closed fracture with routine healing: Secondary | ICD-10-CM | POA: Diagnosis not present

## 2020-10-15 DIAGNOSIS — R5383 Other fatigue: Secondary | ICD-10-CM | POA: Diagnosis not present

## 2020-10-15 DIAGNOSIS — E538 Deficiency of other specified B group vitamins: Secondary | ICD-10-CM | POA: Diagnosis not present

## 2020-10-15 DIAGNOSIS — R600 Localized edema: Secondary | ICD-10-CM | POA: Diagnosis not present

## 2020-10-15 DIAGNOSIS — D509 Iron deficiency anemia, unspecified: Secondary | ICD-10-CM | POA: Diagnosis not present

## 2020-11-14 DIAGNOSIS — Z96642 Presence of left artificial hip joint: Secondary | ICD-10-CM | POA: Diagnosis not present

## 2020-11-14 DIAGNOSIS — M25572 Pain in left ankle and joints of left foot: Secondary | ICD-10-CM | POA: Diagnosis not present

## 2020-11-14 DIAGNOSIS — S72012D Unspecified intracapsular fracture of left femur, subsequent encounter for closed fracture with routine healing: Secondary | ICD-10-CM | POA: Diagnosis not present

## 2020-12-15 DIAGNOSIS — Z96642 Presence of left artificial hip joint: Secondary | ICD-10-CM | POA: Diagnosis not present

## 2020-12-15 DIAGNOSIS — S72012D Unspecified intracapsular fracture of left femur, subsequent encounter for closed fracture with routine healing: Secondary | ICD-10-CM | POA: Diagnosis not present

## 2020-12-15 DIAGNOSIS — M25572 Pain in left ankle and joints of left foot: Secondary | ICD-10-CM | POA: Diagnosis not present

## 2021-03-20 DIAGNOSIS — E538 Deficiency of other specified B group vitamins: Secondary | ICD-10-CM | POA: Diagnosis not present

## 2021-03-20 DIAGNOSIS — R5383 Other fatigue: Secondary | ICD-10-CM | POA: Diagnosis not present

## 2021-03-20 DIAGNOSIS — D649 Anemia, unspecified: Secondary | ICD-10-CM | POA: Diagnosis not present

## 2021-03-20 DIAGNOSIS — Z8781 Personal history of (healed) traumatic fracture: Secondary | ICD-10-CM | POA: Diagnosis not present

## 2021-03-20 DIAGNOSIS — E559 Vitamin D deficiency, unspecified: Secondary | ICD-10-CM | POA: Diagnosis not present

## 2021-03-20 DIAGNOSIS — R531 Weakness: Secondary | ICD-10-CM | POA: Diagnosis not present

## 2021-03-20 DIAGNOSIS — I10 Essential (primary) hypertension: Secondary | ICD-10-CM | POA: Diagnosis not present

## 2021-06-30 DIAGNOSIS — H0289 Other specified disorders of eyelid: Secondary | ICD-10-CM | POA: Diagnosis not present

## 2022-05-19 IMAGING — CR DG CHEST 1V
1 series · 1 of 1 positions shown · non-contrast
Comparison: 03/02/2018

CLINICAL DATA: Fell 2 days ago

EXAM:
CHEST  1 VIEW

[dg chest 1 view]
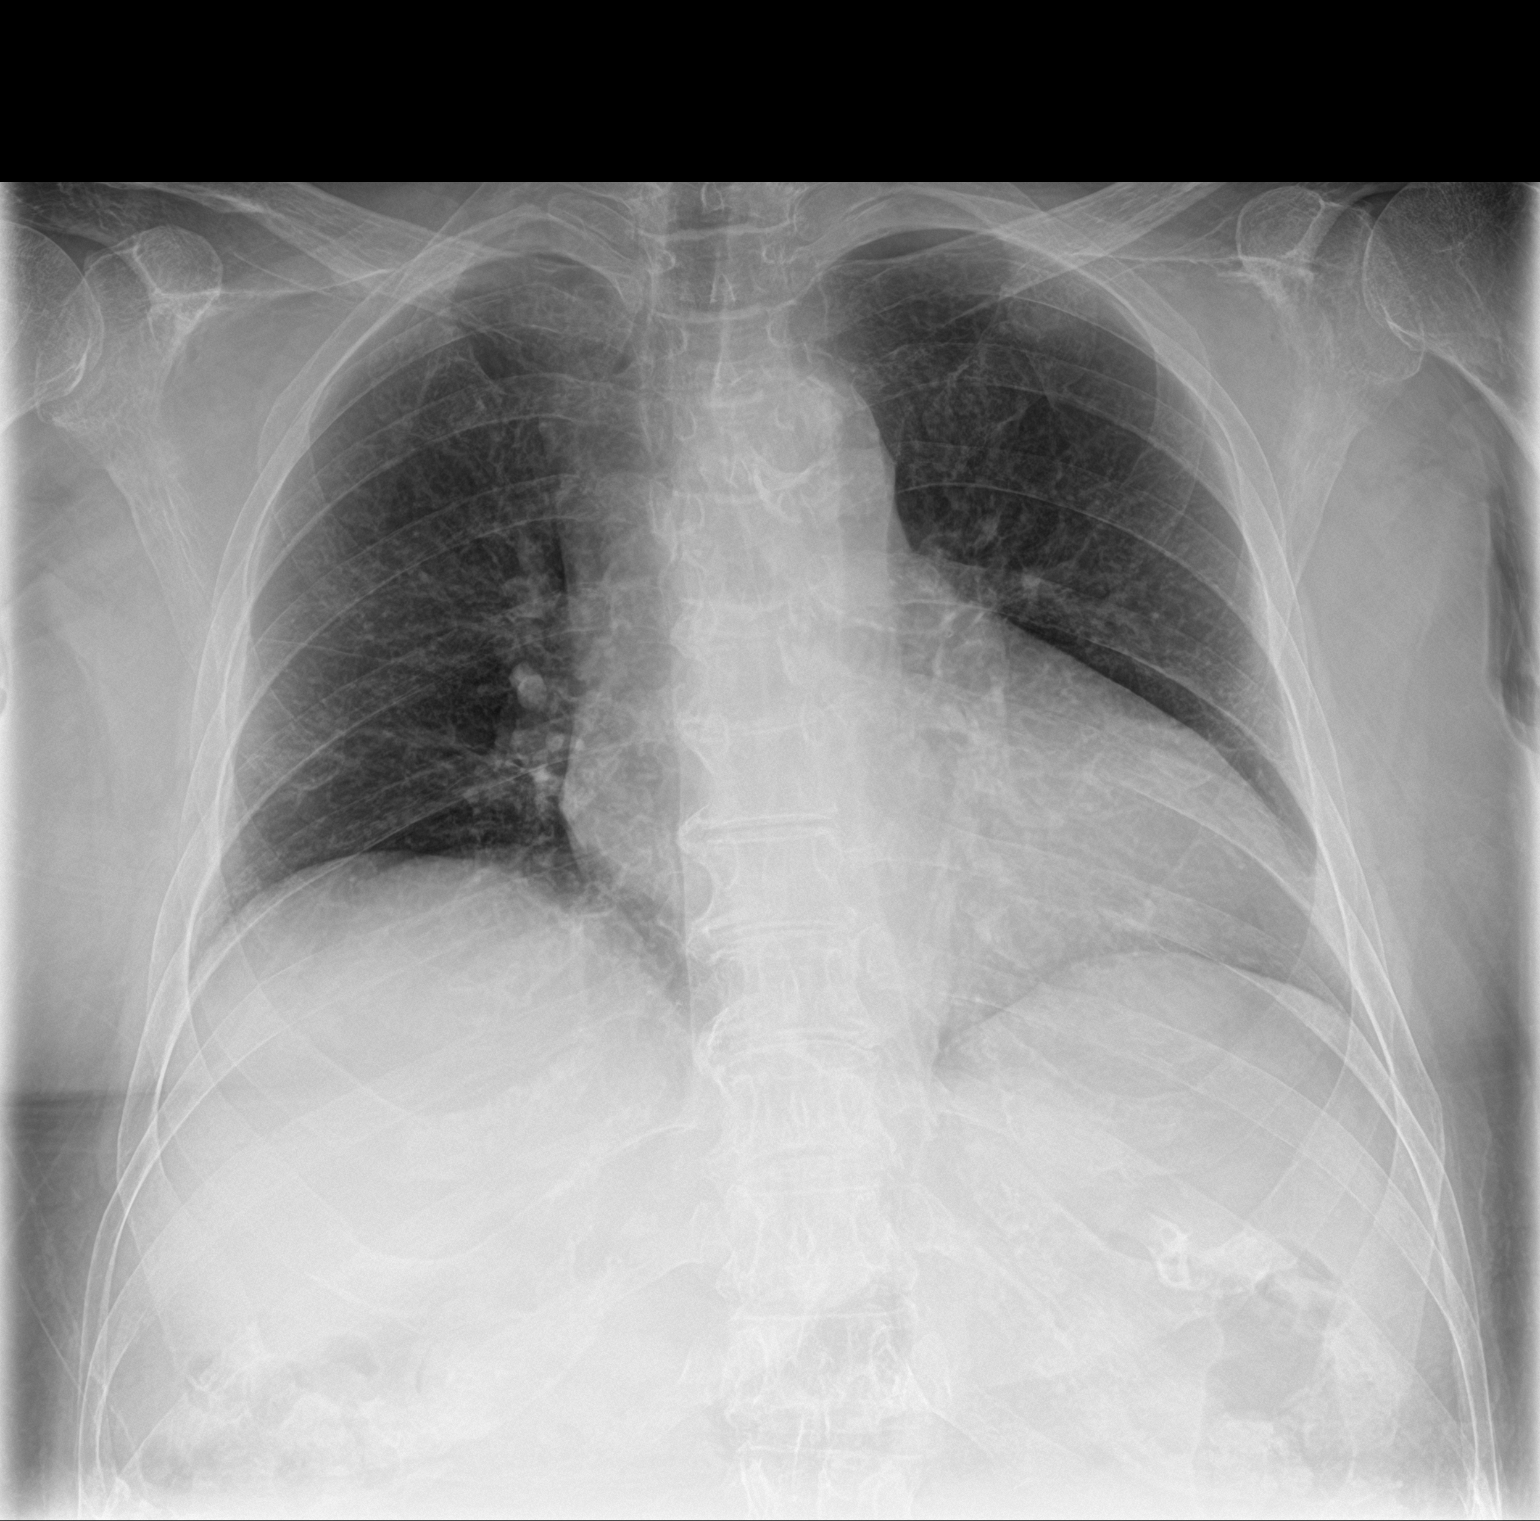

[1 of 1 positions shown; findings below may reference images not displayed]

FINDINGS: Mild cardiomegaly with aortic atherosclerosis. No focal opacity or
pleural effusion. No pneumothorax.
IMPRESSION: No active disease. Mild cardiomegaly.

## 2022-08-09 DEATH — deceased
